# Patient Record
Sex: Female | Born: 1965 | Race: Black or African American | Hispanic: No | Marital: Married | Smoking: Current some day smoker
Health system: Southern US, Community
[De-identification: ages and names within clinical notes are randomized; demographics above are authoritative.]

## PROBLEM LIST (undated history)

## (undated) DIAGNOSIS — R011 Cardiac murmur, unspecified: Secondary | ICD-10-CM

## (undated) DIAGNOSIS — J189 Pneumonia, unspecified organism: Secondary | ICD-10-CM

## (undated) DIAGNOSIS — Z9289 Personal history of other medical treatment: Secondary | ICD-10-CM

## (undated) DIAGNOSIS — I10 Essential (primary) hypertension: Secondary | ICD-10-CM

## (undated) DIAGNOSIS — N946 Dysmenorrhea, unspecified: Secondary | ICD-10-CM

## (undated) DIAGNOSIS — D219 Benign neoplasm of connective and other soft tissue, unspecified: Secondary | ICD-10-CM

## (undated) DIAGNOSIS — F419 Anxiety disorder, unspecified: Secondary | ICD-10-CM

---

## 1988-04-23 HISTORY — PX: CHOLECYSTECTOMY: SHX55

## 1988-04-23 HISTORY — PX: TUBAL LIGATION: SHX77

## 1998-06-18 ENCOUNTER — Emergency Department (HOSPITAL_COMMUNITY): Admission: EM | Admit: 1998-06-18 | Discharge: 1998-06-18 | Payer: Self-pay | Admitting: Emergency Medicine

## 1999-05-12 ENCOUNTER — Emergency Department (HOSPITAL_COMMUNITY): Admission: EM | Admit: 1999-05-12 | Discharge: 1999-05-12 | Payer: Self-pay | Admitting: Emergency Medicine

## 1999-07-08 ENCOUNTER — Encounter: Payer: Self-pay | Admitting: *Deleted

## 1999-07-08 ENCOUNTER — Emergency Department (HOSPITAL_COMMUNITY): Admission: EM | Admit: 1999-07-08 | Discharge: 1999-07-08 | Payer: Self-pay | Admitting: *Deleted

## 2000-03-31 ENCOUNTER — Encounter: Payer: Self-pay | Admitting: *Deleted

## 2000-03-31 ENCOUNTER — Emergency Department (HOSPITAL_COMMUNITY): Admission: EM | Admit: 2000-03-31 | Discharge: 2000-03-31 | Payer: Self-pay | Admitting: Emergency Medicine

## 2000-05-21 ENCOUNTER — Encounter: Payer: Self-pay | Admitting: Emergency Medicine

## 2000-05-21 ENCOUNTER — Emergency Department (HOSPITAL_COMMUNITY): Admission: EM | Admit: 2000-05-21 | Discharge: 2000-05-21 | Payer: Self-pay | Admitting: Emergency Medicine

## 2000-05-27 ENCOUNTER — Encounter: Admission: RE | Admit: 2000-05-27 | Discharge: 2000-07-17 | Payer: Self-pay | Admitting: Orthopaedic Surgery

## 2000-12-28 ENCOUNTER — Emergency Department (HOSPITAL_COMMUNITY): Admission: EM | Admit: 2000-12-28 | Discharge: 2000-12-28 | Payer: Self-pay | Admitting: Emergency Medicine

## 2000-12-29 ENCOUNTER — Emergency Department (HOSPITAL_COMMUNITY): Admission: EM | Admit: 2000-12-29 | Discharge: 2000-12-29 | Payer: Self-pay | Admitting: *Deleted

## 2000-12-29 ENCOUNTER — Encounter: Payer: Self-pay | Admitting: Emergency Medicine

## 2002-09-07 ENCOUNTER — Emergency Department (HOSPITAL_COMMUNITY): Admission: EM | Admit: 2002-09-07 | Discharge: 2002-09-07 | Payer: Self-pay

## 2003-02-23 ENCOUNTER — Emergency Department (HOSPITAL_COMMUNITY): Admission: AD | Admit: 2003-02-23 | Discharge: 2003-02-23 | Payer: Self-pay | Admitting: Family Medicine

## 2003-02-28 ENCOUNTER — Emergency Department (HOSPITAL_COMMUNITY): Admission: AD | Admit: 2003-02-28 | Discharge: 2003-02-28 | Payer: Self-pay | Admitting: Family Medicine

## 2004-09-21 ENCOUNTER — Emergency Department (HOSPITAL_COMMUNITY): Admission: EM | Admit: 2004-09-21 | Discharge: 2004-09-21 | Payer: Self-pay | Admitting: Emergency Medicine

## 2005-02-06 ENCOUNTER — Emergency Department (HOSPITAL_COMMUNITY): Admission: EM | Admit: 2005-02-06 | Discharge: 2005-02-06 | Payer: Self-pay | Admitting: Emergency Medicine

## 2006-06-05 ENCOUNTER — Emergency Department (HOSPITAL_COMMUNITY): Admission: EM | Admit: 2006-06-05 | Discharge: 2006-06-05 | Payer: Self-pay | Admitting: Emergency Medicine

## 2006-10-22 ENCOUNTER — Other Ambulatory Visit: Admission: RE | Admit: 2006-10-22 | Discharge: 2006-10-22 | Payer: Self-pay | Admitting: Family Medicine

## 2006-11-20 ENCOUNTER — Inpatient Hospital Stay (HOSPITAL_COMMUNITY): Admission: EM | Admit: 2006-11-20 | Discharge: 2006-11-21 | Payer: Self-pay | Admitting: Emergency Medicine

## 2007-03-21 ENCOUNTER — Emergency Department (HOSPITAL_COMMUNITY): Admission: EM | Admit: 2007-03-21 | Discharge: 2007-03-22 | Payer: Self-pay | Admitting: Emergency Medicine

## 2007-04-13 ENCOUNTER — Emergency Department (HOSPITAL_COMMUNITY): Admission: EM | Admit: 2007-04-13 | Discharge: 2007-04-13 | Payer: Self-pay | Admitting: Emergency Medicine

## 2007-05-20 ENCOUNTER — Emergency Department (HOSPITAL_COMMUNITY): Admission: EM | Admit: 2007-05-20 | Discharge: 2007-05-20 | Payer: Self-pay | Admitting: Emergency Medicine

## 2007-06-07 ENCOUNTER — Emergency Department (HOSPITAL_COMMUNITY): Admission: EM | Admit: 2007-06-07 | Discharge: 2007-06-07 | Payer: Self-pay | Admitting: Emergency Medicine

## 2007-08-24 ENCOUNTER — Emergency Department (HOSPITAL_COMMUNITY): Admission: EM | Admit: 2007-08-24 | Discharge: 2007-08-24 | Payer: Self-pay | Admitting: Family Medicine

## 2008-01-17 ENCOUNTER — Emergency Department (HOSPITAL_COMMUNITY): Admission: EM | Admit: 2008-01-17 | Discharge: 2008-01-18 | Payer: Self-pay | Admitting: Internal Medicine

## 2008-03-16 ENCOUNTER — Emergency Department (HOSPITAL_COMMUNITY): Admission: EM | Admit: 2008-03-16 | Discharge: 2008-03-17 | Payer: Self-pay | Admitting: Emergency Medicine

## 2008-06-02 ENCOUNTER — Emergency Department (HOSPITAL_COMMUNITY): Admission: EM | Admit: 2008-06-02 | Discharge: 2008-06-02 | Payer: Self-pay | Admitting: Family Medicine

## 2008-10-12 ENCOUNTER — Other Ambulatory Visit: Admission: RE | Admit: 2008-10-12 | Discharge: 2008-10-12 | Payer: Self-pay | Admitting: Family Medicine

## 2009-10-29 ENCOUNTER — Emergency Department (HOSPITAL_COMMUNITY): Admission: EM | Admit: 2009-10-29 | Discharge: 2009-10-29 | Payer: Self-pay | Admitting: Emergency Medicine

## 2009-11-15 ENCOUNTER — Emergency Department (HOSPITAL_COMMUNITY): Admission: EM | Admit: 2009-11-15 | Discharge: 2009-11-15 | Payer: Self-pay | Admitting: Emergency Medicine

## 2010-04-13 ENCOUNTER — Emergency Department (HOSPITAL_COMMUNITY)
Admission: EM | Admit: 2010-04-13 | Discharge: 2010-04-13 | Payer: Self-pay | Source: Home / Self Care | Admitting: Family Medicine

## 2010-04-19 ENCOUNTER — Emergency Department (HOSPITAL_COMMUNITY)
Admission: EM | Admit: 2010-04-19 | Discharge: 2010-04-19 | Payer: Self-pay | Source: Home / Self Care | Admitting: Emergency Medicine

## 2010-06-18 ENCOUNTER — Inpatient Hospital Stay (INDEPENDENT_AMBULATORY_CARE_PROVIDER_SITE_OTHER)
Admission: RE | Admit: 2010-06-18 | Discharge: 2010-06-18 | Disposition: A | Discharge status: Home / Self Care | Payer: Self-pay | Source: Ambulatory Visit | Attending: Emergency Medicine | Admitting: Emergency Medicine

## 2010-06-18 DIAGNOSIS — N760 Acute vaginitis: Secondary | ICD-10-CM

## 2010-06-18 LAB — POCT URINALYSIS DIPSTICK
Bilirubin Urine: NEGATIVE
Hgb urine dipstick: NEGATIVE
Ketones, ur: NEGATIVE mg/dL
Nitrite: NEGATIVE
Protein, ur: NEGATIVE mg/dL
Specific Gravity, Urine: <=1.005 (ref 1.005–1.030)
Urine Glucose, Fasting: NEGATIVE mg/dL
Urobilinogen, UA: 0.2 mg/dL (ref 0.0–1.0)
pH: 6.5 (ref 5.0–8.0)

## 2010-06-18 LAB — WET PREP, GENITAL
Clue Cells Wet Prep HPF POC: NONE SEEN
Trich, Wet Prep: NONE SEEN
Yeast Wet Prep HPF POC: NONE SEEN

## 2010-06-18 LAB — POCT PREGNANCY, URINE: Preg Test, Ur: NEGATIVE

## 2010-06-19 LAB — GC/CHLAMYDIA PROBE AMP, GENITAL
Chlamydia, DNA Probe: NEGATIVE
GC Probe Amp, Genital: NEGATIVE

## 2010-07-08 LAB — WET PREP, GENITAL
Trich, Wet Prep: NONE SEEN
Yeast Wet Prep HPF POC: NONE SEEN

## 2010-07-08 LAB — POCT URINALYSIS DIP (DEVICE)
Bilirubin Urine: NEGATIVE
Glucose, UA: NEGATIVE mg/dL
Ketones, ur: NEGATIVE mg/dL
Nitrite: NEGATIVE
Protein, ur: 30 mg/dL — AB
Specific Gravity, Urine: 1.02 (ref 1.005–1.030)
Urobilinogen, UA: 0.2 mg/dL (ref 0.0–1.0)
pH: 6 (ref 5.0–8.0)

## 2010-07-08 LAB — POCT PREGNANCY, URINE: Preg Test, Ur: NEGATIVE

## 2010-07-08 LAB — GC/CHLAMYDIA PROBE AMP, GENITAL
Chlamydia, DNA Probe: NEGATIVE
GC Probe Amp, Genital: NEGATIVE

## 2010-07-09 LAB — WET PREP, GENITAL
Trich, Wet Prep: NONE SEEN
Yeast Wet Prep HPF POC: NONE SEEN

## 2010-07-09 LAB — GC/CHLAMYDIA PROBE AMP, GENITAL
Chlamydia, DNA Probe: NEGATIVE
GC Probe Amp, Genital: NEGATIVE

## 2010-09-05 NOTE — Discharge Summary (Signed)
NAMESAM, WUNSCHEL NO.:  [id number]   MEDICAL RECORD NO.:  [id number]          PATIENT TYPE:  INP   LOCATION:  5703                         FACILITY:  [facility]   PHYSICIAN:  Dr. Janee Morn           DATE OF BIRTH:  [birth year]   DATE OF ADMISSION:  11/20/2006  DATE OF DISCHARGE:  11/21/2006                               DISCHARGE SUMMARY   CHIEF COMPLAINT/REASON FOR ADMISSION:  Ms. Menendez is a 45 year old  female patient with history of IBS with constipation as the primary  symptom, also anemia recently started on iron replacement therapy  several weeks ago.  She presented to the ER on the day of admission with  sudden onset of abdominal pain, sharp and cramping 10/10.  By the time  Dr. Luisa Hart evaluated the patient, she was greatly improved.  She was  passing flatus but had not had a BM since yesterday.  She was having  nausea and vomiting and she said the pain felt gas-like.  In the ER, the  patient had stable vital signs and was afebrile.  Her white count was  elevated at 13,400, hemoglobin 0.4, platelets 351, neutrophils 76%.  Potassium 5, creatinine 0.95, BUN 5, calcium was 11.4.  Total bilirubin  was elevated at 1.9.  Lipase was 14.  Urinalysis showed moderate  leukocyte esterase, few epithelials, urine wbc 7-10 and also a trace of  Trichomonas.  Because of her abdominal symptoms, she underwent a CT scan  of the abdomen and pelvis that was questioning new lateral herniation of  small bowel suggestive of internal hernia without obstruction or  inflammation or vascular compromise.  The colon was distended to the  rectum without any evidence of volvulus and there was a massive amount  of air and stool in the colon.  Dr. Luisa Hart was called in to evaluate  the patient because of the possible internal hernia.  He did review the  films with the radiologist and at this time did not feel that her  clinical presentation was consistent with internal hernia and her  symptoms were more related to the massive colonic dilatation, possibly  from severe chronic stool.  There were no signs of volvulus.  There was  some concern the patient may have a mass versus, have atonic colon and a  functional problem with her colon.  She was admitted for further  evaluation.   ADMITTING DIAGNOSIS:  Nausea, vomiting and abdominal pain secondary to  massive colonic distension, etiology uncertain.   HOSPITAL COURSE:  The patient was admitted.  She was placed empirically  on Cipro and Flagyl, started aggressive IV fluid hydration and because  of the elevated calcium, a parathyroid level was checked.  By the  afternoon of the date of admission, the patient's abdomen was much less  distended and soft, nontender and she was passing large volumes of  flatus and some mushy stool.   On the same date, the patient was evaluated by GI physician, Dr. Evette Cristal,  who agreed with treating the patient with Cipro for UTI and the Flagyl  for  the Trichomonas.  At this time, he did not feel any specific reason  to do a colonoscopy.  MiraLax was initiated and the patient was started  on a clear liquid diet.  By date of discharge, the patient's white count  had normalized to 8300.  Her hemoglobin was back to near baseline at  10.2 after hydration, her total bilirubin decreased to 1 as well as her  calcium decreased to 8.9 after hydration.  Her parathyroid level was  still pending.  She had not passed a bowel movement but she was only  taking a clear liquid diet without nausea or any abdominal pain.  She  was reporting that she had the sensation to have a BM but had not had a  bowel movement yet.  The patient was started on a regular diet and was  also given a 2 gram dose of p.o. Flagyl to treat the Trichomonas and  plans were, if the patient tolerated her diet, to go ahead and discharge  her later in the day on November 21, 2006.   FINAL DISCHARGE DIAGNOSES:  1. Abdominal pain, nausea and  vomiting secondary to massive colonic      distension.  2. History of irritable bowel syndrome with constipation as primary      feature.  3. Recent initiation of iron therapy, suspect etiology for worsening      constipation.  4. Urinary tract infection on Cipro.  Cultures not obtained.  5. Hypercalcemia, suspected related to volume depletion, parathyroid      level pending at time of discharge.  6. Trace Trichomonas in urine treated with single dose Flagyl this      admission.  7. Anemia on iron prior to admission.   DISCHARGE MEDICATIONS:  1. Iron 325 mg daily.  2. MiraLax 17 grams in 8 ounces of fluid twice daily for three days      then daily, this is available over-the-counter.  3. Colace/docusate sodium 100 mg three times daily, this is over-the-      counter.  4. Cipro 500 mg b.i.d. for three days for UTI.  5. The patient was given 2 grams Flagyl at hospital to treat trace      Trichomonas in urine.   DIET:  No restrictions.   WOUND CARE:  Not applicable.   ACTIVITY:  Increase activity slowly.  Return to work this coming Monday,  November 25, 2006.   FOLLOW UP:  1. You need to follow up with Dr. Evette Cristal in gastroenterology as needed      or as instructed.  2. You need to follow up with your primary care physician, Dr. Mirna Mires, to be seen in one week.      Allison L. Rennis Harding, N.P.    ______________________________  Dr. Janee Morn    ALE/MEDQ  D:  11/21/2006  T:  11/21/2006  Job:  [id number]   cc:   Graylin Shiver, M.D.  Annia Friendly. Loleta Chance, MD

## 2010-09-05 NOTE — Consult Note (Signed)
[name], Kimberly Jefferson NO.:  [id number]   MEDICAL RECORD NO.:  [id number]          PATIENT TYPE:  INP   LOCATION:  5703                         FACILITY:  [facility]   PHYSICIAN:  Maisie Fus A. Cornett, M.D.DATE OF BIRTH:  28-May-1965   DATE OF CONSULTATION:  DATE OF DISCHARGE:                                 CONSULTATION   REQUESTING PHYSICIAN:  Jeffrey P. Caporossi, MD   REASON FOR CONSULTATION:  Abdominal pain, nausea and vomiting.   HISTORY OF PRESENT ILLNESS:  The patient is a 45 year old female with a  sudden onset of crampy, sharp abdominal pain showing up at 5 p.m.  yesterday.  It was associated with distention, nausea, and vomiting.  Her last bowel movement was some time over a day ago.  She was not  passing any flatus until just recently.  Her abdominal pain became  severe, and she sought attention here at the medical center.  The pain  is diffuse in her abdomen.  It is sharp in nature, and it is 7 to 10/10.  It was made better by passing gas.  She has had no nausea or vomiting.  She also had some pain medicine, which has helped her.  Currently, she  feels much better than she did earlier.  She is having no nausea, and  her abdominal pain is much improved, she states.  She has history of  irritable bowel syndrome.  Also has a history of iron deficiency anemia,  was recently placed on iron, diagnosed just 2 weeks ago.  Denies any  blood in her stool or urine.  Denies any blood in her emesis.  Does not  complain of any dysuria.   PAST MEDICAL HISTORY:  1. Irritable bowel syndrome.  2. Iron deficiency anemia.   PAST SURGICAL HISTORY:  None.   MEDICATIONS:  Iron.   ALLERGIES TO MEDICINES:  None.   FAMILY HISTORY:  Positive for colon cancer in her brother, who is in his  51s.   SOCIAL HISTORY:  Denies alcohol use, does drink alcohol occasionally.   REVIEW OF SYSTEMS:  Fifteen-point review of systems is as stated above,  otherwise negative x15.   PHYSICAL  EXAMINATION:  VITAL SIGNS:  Temperature 97, blood pressure  130/79, heart rate 60.  GENERAL APPEARANCE:  Pleasant female in no apparent distress.  HEENT:  Extraocular movements are intact.  No evidence of scleral  icterus.  NECK:  Supple, nontender.  Trachea midline.  No mass.  Oropharynx is  moist.  PULMONARY:  Lung sounds are clear bilaterally.  Chest wall motion is  normal.  CARDIOVASCULAR:  Regular rate and rhythm without rub, murmur, or gallop.  EXTREMITIES:  Well perfused and warm.  ABDOMEN:  Soft, minimally distended.  Bowel sounds are present and  normoactive.  No rebound, no guarding or mass noted.  No signs of  peritonitis.  GU/RECTAL EXAMINATION:  Done.  Tone was normal.  No gross blood.  No  mass could be palpated as far as I could get my finger up, which is  about 4-5 cm.  EXTREMITIES:  Muscle tone normal.  Range  of motion normal.  NEUROLOGICAL EXAMINATION:  Motor and sensory function are grossly  intact.  Glasgow coma scale is 15.   DIAGNOSTIC STUDIES:  I reviewed her abdominal and pelvic CT.  She had a  dilated stomach and a massively dilated colon, primarily involving her  transverse and left colon all the way into the rectum with rectal  dilatation.  Small bowel was displaced to the right of the cecum in the  right gutter.  It is not edematous and shows no signs of dilatation.  Stomach is distended.  The patient has a white count of 13,400.  There  is no left shift.  She is anemic with a hemoglobin of 11.4.  Platelet  count is normal.  Her calcium, though, is 11.4, which is elevated.  The  remainder of her electrolytes are within normal limits.  Liver function  is within normal limits.  Lipase is 14.  Urinalysis shows some white  cells and is leukocyte esterase positive.   IMPRESSION:  1. Distal colonic obstruction versus functional obstruction of distal      colon with history of iron deficiency anemia.  2. Probable urinary tract infection.  3.  Hypercalcemia.   DISCUSSION:  I reviewed her scan and examined the patient.  Doubt she  has an internal hernia at this point, since she is much better, and  dilatation of her colon all the way into the rectum, which does not  support an internal hernia.  Her scan does look somewhat atypical with  her small bowel and her right gutter, but I am not sure if it is  secondary to displacement of all her small bowel towards the right lower  quadrant due to the distention of her colon.  She does not have any  peritoneal signs.  Currently has been passing some gas.  I have to  question if this is a functional problem, colonic inertia versus a  structural problem in the mid rectum, which would be a concern for  possible colon cancer.  I believe admission, further workup is  warranted, and a GI consultation will be helpful.  As for her  hypercalcemia, PTH level will need to be drawn as well.  We will place  her on Cipro 400 mg IV b.i.d. to treat urinary tract infection.  I have  explained this to the patient.  She understands the above and agrees to  proceed.  Clinically, though, she looks much better than earlier this  evening by reviewing her report.      Thomas A. Cornett, M.D.  Electronically Signed     TAC/MEDQ  D:  11/20/2006  T:  11/20/2006  Job:  [id number]

## 2010-09-05 NOTE — Consult Note (Signed)
NAMELAYLA, KESLING NO.:  [id number]   MEDICAL RECORD NO.:  [id number]          PATIENT TYPE:  INP   LOCATION:  5703                         FACILITY:  [facility]   PHYSICIAN:  Graylin Shiver, M.D.   DATE OF BIRTH:  [birth year]   DATE OF CONSULTATION:  DATE OF DISCHARGE:                                 CONSULTATION   REASON FOR CONSULTATION:  We were asked to see Ms. Schryver in  consultation for abdominal pain by Dr. Harriette Bouillon of [facility] surgery.   HISTORY OF PRESENT ILLNESS:  This is a 45 year old female with sudden  onset of sharp abdominal pain and distension that started yesterday at  5:30 p.m.  The patient reports it started in her lower abdomen and  initially felt like gas.  The pain progressed to cover her entire  abdomen, and per the patient, her abdomen is normally flat but became  hard and distended yesterday evening.  She reports no recent illness.  She reports that 3 weeks ago she started taking ferrous sulfate 325 mg  b.i.d.  She reports that she has gained approximately 5 pounds in the  last month.  She has had a change in bowel habits in that she is  normally constipated, but since starting on iron, her bowel movements  have become more frequent and creamy.  She reports positive flatus.  She  denies any vomiting, melena, hematochezia, previous history of this  abdominal pain.  She denies any anorexia.   PAST MEDICAL HISTORY:  1. C-section.  2. Cholecystectomy.  3. Currently she has been diagnosed with anemia.  4. History of irritable bowel syndrome.   CURRENT MEDICATIONS:  Ferrous sulfate 325 mg b.i.d.   REVIEW OF SYSTEMS:  positive for lower extremity swelling over the last  month and 5 pound weight gain.   SOCIAL HISTORY:  She denies any alcohol use ever and reports positive  one pack per day times 14 years tobacco use.   FAMILY HISTORY:  Negative for colon cancer.  However, her brother has a  colon problem.  She was unsure  of what it was.  There is no irritable  bowel disease that she knows of in the family.   PHYSICAL EXAMINATION:  GENERAL:  She is alert and oriented in no  apparent distress.  She is very pleasant to speak with.  Her  cardiovascular system is slightly bradycardic with a regular rhythm and  no murmurs, rubs or gallops appreciated.  Lungs are clear.  Her abdomen  is soft, mildly to moderately distended.  She has good bowel sounds.  She is nontender.  LFTs show an increased bilirubin of 1.9, hemoglobin  is currently 11.4 with an MCV value of 76.  She has a white count 13.4.  Her Chem-7 is within normal limits.  Serum calcium is high at 11.4.  She  has a urinalysis that is positive for UTI as well as trichomoniasis.  CT  scan done earlier this morning showed:  1. Stomach distended and fluid filled.  2. Cecum filled with stool.  3. Internal hernia.  4.  No bowel obstruction or volvulus.   ASSESSMENT:  Dr. Wandalee Ferdinand has seen and examined the patient, collected  a history.  His impression is that this is a 44 year old African  American female with sudden onset of abdominal pain and distension.  She  has constipation, possible ileus, elevated bilirubin urinary tract  infection with Trich infection as well as an internal hernia.  Agree  with Cipro.  May need metronidazole for Trich.  Will start MiraLax to  soften stools and clean out bowel.  Will follow with you.  Thank you  very much for this consultation.      Stephani Police, PA    ______________________________  Graylin Shiver, M.D.    MLY/MEDQ  D:  11/20/2006  T:  11/21/2006  Job:  [id number]   cc:   Annia Friendly. Loleta Chance, MD  Clovis Pu. Cornett, M.D.

## 2010-10-18 ENCOUNTER — Inpatient Hospital Stay (INDEPENDENT_AMBULATORY_CARE_PROVIDER_SITE_OTHER)
Admission: RE | Admit: 2010-10-18 | Discharge: 2010-10-18 | Disposition: A | Discharge status: Home / Self Care | Payer: Self-pay | Source: Ambulatory Visit

## 2010-10-18 DIAGNOSIS — K089 Disorder of teeth and supporting structures, unspecified: Secondary | ICD-10-CM

## 2010-11-20 ENCOUNTER — Encounter (INDEPENDENT_AMBULATORY_CARE_PROVIDER_SITE_OTHER): Payer: Self-pay | Admitting: *Deleted

## 2010-11-20 NOTE — Telephone Encounter (Signed)
A user error has taken place: disregard.

## 2011-01-15 LAB — POCT RAPID STREP A: Streptococcus, Group A Screen (Direct): NEGATIVE

## 2011-01-15 LAB — INFLUENZA A AND B ANTIGEN (CONVERTED LAB)
Inflenza A Ag: NEGATIVE
Influenza B Ag: NEGATIVE

## 2011-01-24 LAB — POCT PREGNANCY, URINE: Preg Test, Ur: NEGATIVE

## 2011-01-26 ENCOUNTER — Encounter (HOSPITAL_COMMUNITY): Payer: Self-pay | Admitting: Radiology

## 2011-01-26 ENCOUNTER — Emergency Department (HOSPITAL_COMMUNITY): Payer: Self-pay

## 2011-01-26 ENCOUNTER — Observation Stay (HOSPITAL_COMMUNITY)
Admission: EM | Admit: 2011-01-26 | Discharge: 2011-01-26 | Disposition: A | Discharge status: Home / Self Care | Payer: Self-pay | Attending: Emergency Medicine | Admitting: Emergency Medicine

## 2011-01-26 ENCOUNTER — Observation Stay (HOSPITAL_COMMUNITY): Payer: Self-pay

## 2011-01-26 DIAGNOSIS — A5901 Trichomonal vulvovaginitis: Secondary | ICD-10-CM | POA: Insufficient documentation

## 2011-01-26 DIAGNOSIS — N739 Female pelvic inflammatory disease, unspecified: Secondary | ICD-10-CM | POA: Insufficient documentation

## 2011-01-26 DIAGNOSIS — R079 Chest pain, unspecified: Principal | ICD-10-CM | POA: Insufficient documentation

## 2011-01-26 DIAGNOSIS — N946 Dysmenorrhea, unspecified: Secondary | ICD-10-CM | POA: Insufficient documentation

## 2011-01-26 HISTORY — DX: Essential (primary) hypertension: I10

## 2011-01-26 LAB — POCT I-STAT, CHEM 8
BUN: 11 mg/dL (ref 6–23)
Calcium, Ion: 1.12 mmol/L (ref 1.12–1.32)
Creatinine, Ser: 0.9 mg/dL (ref 0.50–1.10)
Hemoglobin: 10.2 g/dL — ABNORMAL LOW (ref 12.0–15.0)
Sodium: 140 mEq/L (ref 135–145)
TCO2: 21 mmol/L (ref 0–100)

## 2011-01-26 LAB — CBC
HCT: 29 % — ABNORMAL LOW (ref 36.0–46.0)
Hemoglobin: 8.3 g/dL — ABNORMAL LOW (ref 12.0–15.0)
MCH: 19.4 pg — ABNORMAL LOW (ref 26.0–34.0)
MCHC: 28.6 g/dL — ABNORMAL LOW (ref 30.0–36.0)
MCV: 67.9 fL — ABNORMAL LOW (ref 78.0–100.0)
RDW: 19.2 % — ABNORMAL HIGH (ref 11.5–15.5)

## 2011-01-26 LAB — WET PREP, GENITAL
Clue Cells Wet Prep HPF POC: NONE SEEN
Yeast Wet Prep HPF POC: NONE SEEN

## 2011-01-26 LAB — DIFFERENTIAL
Eosinophils Relative: 2 % (ref 0–5)
Lymphs Abs: 3.1 10*3/uL (ref 0.7–4.0)
Monocytes Absolute: 0.9 10*3/uL (ref 0.1–1.0)
Monocytes Relative: 8 % (ref 3–12)
Neutro Abs: 7.6 10*3/uL (ref 1.7–7.7)

## 2011-01-26 LAB — CK TOTAL AND CKMB (NOT AT ARMC): Total CK: 94 U/L (ref 7–177)

## 2011-01-26 MED ORDER — IOHEXOL 350 MG/ML SOLN
80.0000 mL | Freq: Once | INTRAVENOUS | Status: AC | PRN
  Administered 2011-01-26: 80 mL via INTRAVENOUS

## 2011-01-29 LAB — GC/CHLAMYDIA PROBE AMP, GENITAL: GC Probe Amp, Genital: NEGATIVE

## 2011-01-30 LAB — CBC
HCT: 35.3 — ABNORMAL LOW
Hemoglobin: 11.8 — ABNORMAL LOW
MCHC: 33.3
MCV: 79.8
RBC: 4.42

## 2011-01-30 LAB — I-STAT 8, (EC8 V) (CONVERTED LAB)
BUN: 10
Chloride: 107
Glucose, Bld: 89
Hemoglobin: 13.6
Operator id: 272551
pCO2, Ven: 46.5

## 2011-01-30 LAB — URINALYSIS, ROUTINE W REFLEX MICROSCOPIC
Hgb urine dipstick: NEGATIVE
Nitrite: NEGATIVE
Specific Gravity, Urine: 1.005
Urobilinogen, UA: 0.2

## 2011-01-30 LAB — DIFFERENTIAL
Basophils Relative: 1
Eosinophils Relative: 4
Monocytes Absolute: 1
Monocytes Relative: 7
Neutro Abs: 9.5 — ABNORMAL HIGH

## 2011-02-05 LAB — DIFFERENTIAL
Basophils Absolute: 0
Basophils Absolute: 0.1
Eosinophils Absolute: 0.1
Eosinophils Relative: 1
Eosinophils Relative: 2
Lymphocytes Relative: 43
Lymphs Abs: 2.4
Lymphs Abs: 3.6 — ABNORMAL HIGH
Monocytes Absolute: 0.6
Monocytes Relative: 7
Neutro Abs: 3.9

## 2011-02-05 LAB — URINALYSIS, ROUTINE W REFLEX MICROSCOPIC
Bilirubin Urine: NEGATIVE
Ketones, ur: NEGATIVE
Nitrite: NEGATIVE
pH: 8

## 2011-02-05 LAB — COMPREHENSIVE METABOLIC PANEL
ALT: 10
AST: 15
AST: 34
Albumin: 3.4 — ABNORMAL LOW
BUN: 3 — ABNORMAL LOW
CO2: 29
Calcium: 8.9
Chloride: 100
Chloride: 108
Creatinine, Ser: 0.93
GFR calc Af Amer: >60
GFR calc Af Amer: >60
GFR calc non Af Amer: >60
Potassium: 5
Sodium: 137
Total Bilirubin: 1.9 — ABNORMAL HIGH
Total Protein: 5.9 — ABNORMAL LOW

## 2011-02-05 LAB — CBC
HCT: 31.2 — ABNORMAL LOW
HCT: 31.7 — ABNORMAL LOW
MCHC: 32.6
MCV: 75.7 — ABNORMAL LOW
MCV: 77.4 — ABNORMAL LOW
Platelets: 271
RBC: 4.13
RBC: 4.51
RDW: 18.3 — ABNORMAL HIGH
WBC: 11.8 — ABNORMAL HIGH
WBC: 13.4 — ABNORMAL HIGH
WBC: 8.3

## 2011-02-05 LAB — URINE MICROSCOPIC-ADD ON

## 2011-02-05 LAB — LIPASE, BLOOD: Lipase: 14

## 2011-04-24 DIAGNOSIS — Z9289 Personal history of other medical treatment: Secondary | ICD-10-CM

## 2011-04-24 HISTORY — DX: Personal history of other medical treatment: Z92.89

## 2011-07-15 ENCOUNTER — Emergency Department (HOSPITAL_COMMUNITY): Payer: Self-pay

## 2011-07-15 ENCOUNTER — Encounter (HOSPITAL_COMMUNITY): Payer: Self-pay

## 2011-07-15 ENCOUNTER — Other Ambulatory Visit: Payer: Self-pay

## 2011-07-15 ENCOUNTER — Emergency Department (HOSPITAL_COMMUNITY)
Admission: EM | Admit: 2011-07-15 | Discharge: 2011-07-15 | Disposition: A | Discharge status: Home / Self Care | Payer: Self-pay | Attending: Emergency Medicine | Admitting: Emergency Medicine

## 2011-07-15 DIAGNOSIS — I10 Essential (primary) hypertension: Secondary | ICD-10-CM | POA: Insufficient documentation

## 2011-07-15 DIAGNOSIS — R1032 Left lower quadrant pain: Secondary | ICD-10-CM | POA: Insufficient documentation

## 2011-07-15 DIAGNOSIS — N898 Other specified noninflammatory disorders of vagina: Secondary | ICD-10-CM | POA: Insufficient documentation

## 2011-07-15 DIAGNOSIS — R079 Chest pain, unspecified: Secondary | ICD-10-CM | POA: Insufficient documentation

## 2011-07-15 LAB — DIFFERENTIAL
Basophils Relative: 0 % (ref 0–1)
Eosinophils Absolute: 0.3 10*3/uL (ref 0.0–0.7)
Lymphocytes Relative: 23 % (ref 12–46)
Lymphs Abs: 2.9 10*3/uL (ref 0.7–4.0)
Neutro Abs: 8.4 10*3/uL — ABNORMAL HIGH (ref 1.7–7.7)

## 2011-07-15 LAB — POCT I-STAT TROPONIN I: Troponin i, poc: 0 ng/mL (ref 0.00–0.08)

## 2011-07-15 LAB — URINALYSIS, ROUTINE W REFLEX MICROSCOPIC
Glucose, UA: NEGATIVE mg/dL
Ketones, ur: 15 mg/dL — AB
pH: 6 (ref 5.0–8.0)

## 2011-07-15 LAB — CBC
HCT: 28.6 % — ABNORMAL LOW (ref 36.0–46.0)
Hemoglobin: 8.2 g/dL — ABNORMAL LOW (ref 12.0–15.0)
MCH: 19.9 pg — ABNORMAL LOW (ref 26.0–34.0)
MCHC: 28.7 g/dL — ABNORMAL LOW (ref 30.0–36.0)
MCV: 69.4 fL — ABNORMAL LOW (ref 78.0–100.0)
RDW: 19.4 % — ABNORMAL HIGH (ref 11.5–15.5)

## 2011-07-15 LAB — POCT I-STAT, CHEM 8
BUN: 13 mg/dL (ref 6–23)
Chloride: 105 mEq/L (ref 96–112)
Creatinine, Ser: 0.9 mg/dL (ref 0.50–1.10)
Sodium: 140 mEq/L (ref 135–145)

## 2011-07-15 LAB — URINE MICROSCOPIC-ADD ON

## 2011-07-15 MED ORDER — NAPROXEN 500 MG PO TABS: 500.0000 mg | ORAL_TABLET | Freq: Two times a day (BID) | ORAL | Status: DC

## 2011-07-15 MED ORDER — SODIUM CHLORIDE 0.9 % IV SOLN
Freq: Once | INTRAVENOUS | Status: AC
  Administered 2011-07-15: 05:00:00 via INTRAVENOUS

## 2011-07-15 MED ORDER — OXYCODONE-ACETAMINOPHEN 5-325 MG PO TABS: 1.0000 | ORAL_TABLET | ORAL | Status: AC | PRN

## 2011-07-15 MED ORDER — HYDROMORPHONE HCL PF 1 MG/ML IJ SOLN
1.0000 mg | Freq: Once | INTRAMUSCULAR | Status: AC
  Administered 2011-07-15: 1 mg via INTRAVENOUS
  Filled 2011-07-15: qty 1

## 2011-07-15 NOTE — ED Notes (Signed)
Patient complaining of chest pain x 2 weeks.  States she started her menstrual cycle last night and is having severe pain in the lower abdominal area.  States "it feels like something is heavy in her stomach trying to fall out."

## 2011-07-15 NOTE — ED Notes (Signed)
Pt discharged home. No further questions. Vital signs stable.

## 2011-07-15 NOTE — Discharge Instructions (Signed)
You have been diagnosed with undifferentiated abdominal pain.  Abdominal pain can be caused by many things. Your caregiver evaluates the seriousness of your pain by an examination and possibly blood or urine tests and imaging (CT scan, x-rays, ultrasound). Many cases can be observed and treated at home after initial evaluation in the emergency department. Even though you are being discharged home, abdominal pain can be unpredictable. Therefore, you need a repeat exam if your pain does not resolve, returns, or worsens. Most patient's with abdominal pain do not need to be admitted to the hospital or have surgery, but serious problems like appendicitis and gallbladder attacks can start out as nonspecific pain. Many abdominal conditions cannot be diagnosed in 1 visit, so followup evaluations are very important.  Seek immediate medical attention if:  *The pain does not go away or becomes severe. *Temperature above 101 develops *Repeated vomiting occurs(multiple episodes) *The pain becomes localized to portions of the abdomen. The right side could possibly be appendicitis. In an adult, the left lower portion of the abdomen could be colitis or diverticulitis. *Blood is being passed in stools or vomit *Return also if you develop chest pain, difficulty breathing, dizziness or fainting, or become confused poorly responsive or inconsolable (young children).     If you do not have a physician, you should reference the below phone numbers and call in the morning to establish follow up care.  Your caregiver has diagnosed you as having chest pain that is nonspecific for one problem. This means that after looking at you and examining you and ordering tests (such as blood work, chest x-rays and EKG), your caregiver does not believe that the problem is serious enough to need watching in the hospital. This judgment is often made after testing shows no acute heart attack and you are at low risk for sudden acute heart  condition. Chest pain comes from many different causes.  Seek immediate medical attention if:   You have severe chest pain, especially if the pain is crushing or pressure-like and spreads to the arms, back, neck, or jaw, or if you have sweating, nausea, shortness of breath. This is an emergency. Don't wait to see if the pain will go away. Get medical help at once. Call 911 immediately. Do not drive herself to the hospital.   Your chest pain gets worse and does not go away with rest.   You have an attack of chest pain lasting longer than usual, despite rest and treatment with the medications your caregiver has prescribed   You awaken from sleep with chest pain or shortness of breath.   You feel faint or dizzy   You have chest pain not typical of your usual pain for which you originally saw your caregiver.  You must have a repeat evaluation within 24 hours for a recheck of your heart.  Please call your doctor this morning to schedule this appointment. If you do not have a family doctor, please see the list of doctors below.  RESOURCE GUIDE  Dental Problems  Patients with Medicaid: [facility] [phone] [address]                                           [phone] [address] Phone:  [phone]                                                  Phone:  [phone]  If unable to pay or uninsured, contact:  [facility] or [facility] to become qualified for the adult dental clinic.  Chronic Pain Problems Contact [facility]  [phone] Patients need to be referred by their primary care doctor.  Insufficient Money for Medicine Contact [employer]:  call "211" or [facility] (289)606-7954.  No Primary Care Doctor Call [facility]  330-519-3444 Other agencies that provide inexpensive medical care    [facility]  [phone]    [facility]  [phone]    [facility]  [phone]    [facility]  [phone]    [facility]  [phone]    [facility]  [phone]  Psychological Services [facility] Behavioral Health  [phone] [facility] Services  [phone] [facility] Mental Health   [phone] (emergency services [phone])  Substance Abuse Resources [facility] and Drug Services  [phone] [facility] (717)710-1595 [facility] [phone] [phone] Floydene Flock ([phone]) [phone] Residential & Outpatient Substance Abuse Program  5598566005  Abuse/Neglect [facility] Child Abuse Hotline (562) 117-5173 [facility] Child Abuse Hotline 615-764-8173 (After Hours)  Emergency Shelter [facility] (331)253-0414  Maternity Homes Room at the [place] [facility] 986-256-0868 [facility] [phone]  MRSA Hotline #:   (980)325-7524    [facility] Resources  [facility]     [employer]                          [facility] Dept. [address] [place]                       [address]      [address]  Blondell Reveal Phone:  [phone]                                   Phone:  [phone]                 Phone:  [phone]  [facility] Mental Health Phone:  [phone]  [facility] Child Abuse Hotline 408-539-6713 618-698-6291 (After Hours)

## 2011-07-15 NOTE — ED Provider Notes (Signed)
History     CSN: [id number]  Arrival date & time 07/15/11  0401   First MD Initiated Contact with Patient 07/15/11 0410      Chief Complaint  Patient presents with  . Chest Pain    (Consider location/radiation/quality/duration/timing/severity/associated sxs/prior treatment) HPI Comments: 46 year old female with a history of hypertension who presents with 2 weeks of left upper sharp chest pain. This is intermittent, worse this evening, feels sharp and stabbing and is not made worse with position or deep breathing. It is made worse with palpation of the left chest. There has been no associated coughing, shortness of breath, fever or back pain. She denies immobilization, recent trauma, surgery, travel, swelling or asymmetry of the legs. She has no history of venous thromboembolism. She also complains of left lower quadrant pain in her abdomen, has started her menstrual cycle in the last 2 days and has had a progressive cramping in the left lower quadrant consistent with her menstrual cramps. She is not regular, has associated vaginal bleeding but no dysuria or changes in bowel habits.  Patient is a 46 y.o. female presenting with chest pain. The history is provided by the patient and the spouse.  Chest Pain     Past Medical History  Diagnosis Date  . Hypertension     Past Surgical History  Procedure Date  . Cholecystectomy     No family history on file.  History  Substance Use Topics  . Smoking status: Current Everyday Smoker  . Smokeless tobacco: Not on file  . Alcohol Use: No    OB History    Grav Para Term Preterm Abortions TAB SAB Ect Mult Living                  Review of Systems  Cardiovascular: Positive for chest pain.  All other systems reviewed and are negative.    Allergies  Review of patient's allergies indicates no known allergies.  Home Medications   Current Outpatient Rx  Name Route Sig Dispense Refill  . ACETAMINOPHEN 500 MG PO TABS Oral Take  1,000 mg by mouth every 6 (six) hours as needed. Fever or pain    . NAPROXEN 500 MG PO TABS Oral Take 1 tablet (500 mg total) by mouth 2 (two) times daily with a meal. 30 tablet 0  . OXYCODONE-ACETAMINOPHEN 5-325 MG PO TABS Oral Take 1 tablet by mouth every 4 (four) hours as needed for pain. May take 2 tablets PO q 6 hours for severe pain - Do not take with Tylenol as this tablet already contains tylenol 15 tablet 0    BP 105/68  Pulse 83  Temp(Src) 98.3 F (36.8 C) (Oral)  Resp 16  Ht 5\' 7"  (1.702 m)  Wt 140 lb (63.504 kg)  BMI 21.93 kg/m2  SpO2 100%  LMP 07/15/2011  Physical Exam  Nursing note and vitals reviewed. Constitutional: She appears well-developed and well-nourished. No distress.  HENT:  Head: Normocephalic and atraumatic.  Mouth/Throat: Oropharynx is clear and moist. No oropharyngeal exudate.  Eyes: Conjunctivae and EOM are normal. Pupils are equal, round, and reactive to light. Right eye exhibits no discharge. Left eye exhibits no discharge. No scleral icterus.  Neck: Normal range of motion. Neck supple. No JVD present. No thyromegaly present.  Cardiovascular: Normal rate, regular rhythm, normal heart sounds and intact distal pulses.  Exam reveals no gallop and no friction rub.   No murmur heard. Pulmonary/Chest: Effort normal and breath sounds normal. No respiratory distress. She has no  wheezes. She has no rales. She exhibits tenderness ( Reproducible tenderness to the left chest wall, patient states is the same as her sharp pain).  Abdominal: Soft. Bowel sounds are normal. She exhibits no distension and no mass. There is tenderness ( Left lower quadrant tenderness with guarding, non-peritoneal, no pain at the right lower quadrant or right upper quadrant. ).  Musculoskeletal: Normal range of motion. She exhibits no edema and no tenderness.  Lymphadenopathy:    She has no cervical adenopathy.  Neurological: She is alert. Coordination normal.  Skin: Skin is warm and dry.  No rash noted. No erythema.  Psychiatric: She has a normal mood and affect. Her behavior is normal.    ED Course  Procedures (including critical care time)  ED ECG REPORT   Date: 07/15/2011   Rate: 62  Rhythm: normal sinus rhythm  QRS Axis: normal  Intervals: normal  ST/T Wave abnormalities: normal  Conduction Disutrbances:none  Narrative Interpretation:   Old EKG Reviewed: unchanged from 01/26/2011   Labs Reviewed  CBC - Abnormal; Notable for the following:    WBC 12.5 (*)    Hemoglobin 8.2 (*)    HCT 28.6 (*)    MCV 69.4 (*)    MCH 19.9 (*)    MCHC 28.7 (*)    RDW 19.4 (*)    All other components within normal limits  DIFFERENTIAL - Abnormal; Notable for the following:    Neutro Abs 8.4 (*)    All other components within normal limits  URINALYSIS, ROUTINE W REFLEX MICROSCOPIC - Abnormal; Notable for the following:    Color, Urine RED (*) BIOCHEMICALS MAY BE AFFECTED BY COLOR   APPearance CLOUDY (*)    Hgb urine dipstick LARGE (*)    Ketones, ur 15 (*)    Protein, ur 30 (*)    Leukocytes, UA MODERATE (*)    All other components within normal limits  POCT I-STAT, CHEM 8 - Abnormal; Notable for the following:    Hemoglobin 10.2 (*)    HCT 30.0 (*)    All other components within normal limits  URINE MICROSCOPIC-ADD ON - Abnormal; Notable for the following:    Bacteria, UA FEW (*)    All other components within normal limits  POCT I-STAT TROPONIN I  POCT PREGNANCY, URINE   Dg Chest Port 1 View  07/15/2011  *RADIOLOGY REPORT*  Clinical Data: Upper chest pain, shortness of breath, history hypertension  PORTABLE CHEST - 1 VIEW  Comparison: Portable exam 0505 hours compared to 01/26/2011  Findings: Normal heart size, mediastinal contours, and pulmonary vascularity. Minimal chronic peribronchial thickening. Lungs clear. No pleural effusion or pneumothorax. Bones unremarkable.  IMPRESSION: No acute abnormalities.  Original Report Authenticated By: Lollie Marrow, M.D.      1. Chest pain   2. Abdominal pain       MDM  Check pregnancy, urinalysis, labs to rule out other sources of abdominal pain, chest x-ray and troponin, EKG nonischemic.  Clinically the patient does not have a pulmonary embolism with oxygen saturations of 100% on room air, no tachycardia and no risk factors for PE  Laboratory data reviewed showing urinalysis with hematuria likely related to vaginal bleeding, no signs of infection, pregnancy negative, CBC with  Slight leukocytosis, anemia which is significant to hemoglobin from 5 months ago at 8.3.  Basic metabolic panel shows no abnormalities of renal function.  Chest x-ray was normal, no signs of abdominal or chest abnormalities.  After taking pain medication, patient had almost  complete resolution of symptoms and on repeat examination prior to discharge there is no abdominal tenderness, normal vital signs.  I have discussed the findings and plan with patient at length, have given referral to gynecology for possible hysterectomy or consideration for alternative therapy for severe menstrual cramps.  Discharge Prescriptions include:  #1 Naprosyn #2 Percocet  Vida Roller, MD 07/15/11 [id number]

## 2012-01-30 ENCOUNTER — Encounter (HOSPITAL_COMMUNITY): Payer: Self-pay

## 2012-01-30 ENCOUNTER — Emergency Department (INDEPENDENT_AMBULATORY_CARE_PROVIDER_SITE_OTHER)
Admission: EM | Admit: 2012-01-30 | Discharge: 2012-01-30 | Disposition: A | Discharge status: Home / Self Care | Payer: Self-pay | Source: Home / Self Care | Attending: Emergency Medicine | Admitting: Emergency Medicine

## 2012-01-30 DIAGNOSIS — B349 Viral infection, unspecified: Secondary | ICD-10-CM

## 2012-01-30 DIAGNOSIS — B9789 Other viral agents as the cause of diseases classified elsewhere: Secondary | ICD-10-CM

## 2012-01-30 MED ORDER — IBUPROFEN 800 MG PO TABS
800.0000 mg | ORAL_TABLET | Freq: Once | ORAL | Status: AC
  Administered 2012-01-30: 800 mg via ORAL

## 2012-01-30 MED ORDER — IBUPROFEN 800 MG PO TABS: 800.0000 mg | ORAL_TABLET | Freq: Three times a day (TID) | ORAL | Status: DC

## 2012-01-30 MED ORDER — IBUPROFEN 800 MG PO TABS
ORAL_TABLET | ORAL | Status: AC
  Filled 2012-01-30: qty 1

## 2012-01-30 NOTE — ED Provider Notes (Signed)
History     CSN: [id number]  Arrival date & time 01/30/12  1854   None     Chief Complaint  Patient presents with  . Generalized Body Aches    (Consider location/radiation/quality/duration/timing/severity/associated sxs/prior treatment) The history is provided by the patient.  patient reports flu like symptoms for one week, has been taking tylenol for symptoms.  States found out today he fiance of 2 years is actively being treated for HIV.  Past Medical History  Diagnosis Date  . Hypertension     Past Surgical History  Procedure Date  . Cholecystectomy     History reviewed. No pertinent family history.  History  Substance Use Topics  . Smoking status: Current Every Day Smoker  . Smokeless tobacco: Not on file  . Alcohol Use: No    OB History    Grav Para Term Preterm Abortions TAB SAB Ect Mult Living                  Review of Systems  Constitutional: Positive for fever, chills and fatigue. Negative for diaphoresis, activity change and appetite change.  HENT: Positive for congestion and rhinorrhea. Negative for sore throat.   Respiratory: Negative.   Cardiovascular: Negative.   Gastrointestinal: Positive for nausea, vomiting and diarrhea.  Genitourinary: Negative.   Musculoskeletal: Positive for myalgias.  Skin: Negative.   Neurological: Negative.   Hematological: Negative for adenopathy.  Psychiatric/Behavioral: Negative.     Allergies  Review of patient's allergies indicates no known allergies.  Home Medications   Current Outpatient Rx  Name Route Sig Dispense Refill  . ACETAMINOPHEN 500 MG PO TABS Oral Take 1,000 mg by mouth every 6 (six) hours as needed. Fever or pain    . IBUPROFEN 800 MG PO TABS Oral Take 1 tablet (800 mg total) by mouth 3 (three) times daily. 30 tablet 0  . NAPROXEN 500 MG PO TABS Oral Take 1 tablet (500 mg total) by mouth 2 (two) times daily with a meal. 30 tablet 0    BP 124/69  Pulse 81  Temp 98.9 F (37.2 C) (Oral)   Resp 18  SpO2 99%  LMP 01/29/2012  Physical Exam  Nursing note and vitals reviewed. Constitutional: She is oriented to person, place, and time. Vital signs are normal. She appears well-developed and well-nourished. She is active and cooperative.  HENT:  Head: Normocephalic.  Right Ear: Hearing, tympanic membrane, external ear and ear canal normal.  Left Ear: Hearing, tympanic membrane, external ear and ear canal normal.  Nose: Rhinorrhea present. Right sinus exhibits no maxillary sinus tenderness and no frontal sinus tenderness. Left sinus exhibits no maxillary sinus tenderness and no frontal sinus tenderness.  Mouth/Throat: Uvula is midline, oropharynx is clear and moist and mucous membranes are normal. No oropharyngeal exudate.  Eyes: Conjunctivae normal and EOM are normal. Pupils are equal, round, and reactive to light. No scleral icterus.  Neck: Trachea normal and normal range of motion. Neck supple. No spinous process tenderness and no muscular tenderness present.  Cardiovascular: Normal rate, regular rhythm, normal heart sounds, intact distal pulses and normal pulses.   Pulmonary/Chest: Effort normal and breath sounds normal. No respiratory distress. She has no wheezes.  Abdominal: Soft. Normal appearance and bowel sounds are normal. There is no hepatosplenomegaly. There is no tenderness. There is no rebound, no guarding and no CVA tenderness.  Musculoskeletal: Normal range of motion.  Lymphadenopathy:       Head (right side): No submental, no submandibular, no tonsillar, no preauricular,  no posterior auricular and no occipital adenopathy present.       Head (left side): No submental, no submandibular, no tonsillar, no preauricular, no posterior auricular and no occipital adenopathy present.    She has no cervical adenopathy.  Neurological: She is alert and oriented to person, place, and time. She has normal strength and normal reflexes. No cranial nerve deficit or sensory deficit.  Coordination and gait normal. GCS eye subscore is 4. GCS verbal subscore is 5. GCS motor subscore is 6.  Skin: Skin is warm and dry. No rash noted.  Psychiatric: She has a normal mood and affect. Her speech is normal and behavior is normal. Judgment and thought content normal. Cognition and memory are normal.       Tearful on exam    ED Course  Procedures (including critical care time)   Labs Reviewed  HIV ANTIBODY (ROUTINE TESTING)  LAB REPORT - SCANNED   No results found.   1. Viral illness       MDM  HIV testing, await results.  Will need second test in three months.  Follow up with your primary care provider.         Johnsie Kindred, NP 01/31/12 1206

## 2012-01-30 NOTE — ED Notes (Signed)
States she has had general body aches x 4 days, with nausea, HA menstrual cramps . State she boyfriend had informed her the other day that he is HIV positive, and is undergoing treatment; tearful

## 2012-01-31 ENCOUNTER — Telehealth (HOSPITAL_COMMUNITY): Payer: Self-pay | Admitting: *Deleted

## 2012-01-31 NOTE — ED Notes (Signed)
The patient called for her lab results. Pt. verified x 2 and given neg. HIV result. Pt. told to get it rechecked in 6 mos. Kimberly Jefferson 01/31/2012

## 2012-02-01 NOTE — ED Provider Notes (Signed)
Medical screening examination/treatment/procedure(s) were performed by non-physician practitioner and as supervising physician I was immediately available for consultation/collaboration.  Leslee Home, M.D.   Reuben Likes, MD 02/01/12 2124

## 2012-03-10 ENCOUNTER — Emergency Department (HOSPITAL_COMMUNITY): Payer: Self-pay

## 2012-03-10 ENCOUNTER — Encounter (HOSPITAL_COMMUNITY): Payer: Self-pay | Admitting: *Deleted

## 2012-03-10 ENCOUNTER — Inpatient Hospital Stay (HOSPITAL_COMMUNITY)
Admission: EM | Admit: 2012-03-10 | Discharge: 2012-03-13 | DRG: 812 | Disposition: A | Discharge status: Home / Self Care | Payer: 59 | Attending: Internal Medicine | Admitting: Internal Medicine

## 2012-03-10 DIAGNOSIS — N920 Excessive and frequent menstruation with regular cycle: Secondary | ICD-10-CM | POA: Diagnosis present

## 2012-03-10 DIAGNOSIS — D649 Anemia, unspecified: Secondary | ICD-10-CM | POA: Diagnosis present

## 2012-03-10 DIAGNOSIS — M5030 Other cervical disc degeneration, unspecified cervical region: Secondary | ICD-10-CM | POA: Diagnosis present

## 2012-03-10 DIAGNOSIS — R55 Syncope and collapse: Secondary | ICD-10-CM | POA: Diagnosis present

## 2012-03-10 DIAGNOSIS — R197 Diarrhea, unspecified: Secondary | ICD-10-CM | POA: Diagnosis not present

## 2012-03-10 DIAGNOSIS — A599 Trichomoniasis, unspecified: Secondary | ICD-10-CM | POA: Diagnosis present

## 2012-03-10 DIAGNOSIS — N840 Polyp of corpus uteri: Secondary | ICD-10-CM | POA: Diagnosis present

## 2012-03-10 DIAGNOSIS — I10 Essential (primary) hypertension: Secondary | ICD-10-CM | POA: Diagnosis present

## 2012-03-10 DIAGNOSIS — D500 Iron deficiency anemia secondary to blood loss (chronic): Principal | ICD-10-CM | POA: Diagnosis present

## 2012-03-10 DIAGNOSIS — R200 Anesthesia of skin: Secondary | ICD-10-CM

## 2012-03-10 DIAGNOSIS — Z791 Long term (current) use of non-steroidal anti-inflammatories (NSAID): Secondary | ICD-10-CM

## 2012-03-10 DIAGNOSIS — R51 Headache: Secondary | ICD-10-CM | POA: Diagnosis present

## 2012-03-10 DIAGNOSIS — E876 Hypokalemia: Secondary | ICD-10-CM | POA: Diagnosis not present

## 2012-03-10 DIAGNOSIS — F17200 Nicotine dependence, unspecified, uncomplicated: Secondary | ICD-10-CM | POA: Diagnosis present

## 2012-03-10 DIAGNOSIS — R209 Unspecified disturbances of skin sensation: Secondary | ICD-10-CM | POA: Diagnosis present

## 2012-03-10 DIAGNOSIS — R079 Chest pain, unspecified: Secondary | ICD-10-CM | POA: Diagnosis not present

## 2012-03-10 DIAGNOSIS — A5900 Urogenital trichomoniasis, unspecified: Secondary | ICD-10-CM | POA: Diagnosis present

## 2012-03-10 HISTORY — DX: Essential (primary) hypertension: I10

## 2012-03-10 LAB — CBC WITH DIFFERENTIAL/PLATELET
Eosinophils Relative: 1 % (ref 0–5)
Lymphs Abs: 2.6 10*3/uL (ref 0.7–4.0)
MCH: 18.4 pg — ABNORMAL LOW (ref 26.0–34.0)
MCHC: 27.8 g/dL — ABNORMAL LOW (ref 30.0–36.0)
MCV: 66.3 fL — ABNORMAL LOW (ref 78.0–100.0)
Monocytes Absolute: 0.6 10*3/uL (ref 0.1–1.0)
Platelets: 287 10*3/uL (ref 150–400)
RBC: 4.07 MIL/uL (ref 3.87–5.11)

## 2012-03-10 LAB — COMPREHENSIVE METABOLIC PANEL
ALT: 10 U/L (ref 0–35)
AST: 17 U/L (ref 0–37)
Albumin: 4 g/dL (ref 3.5–5.2)
Alkaline Phosphatase: 70 U/L (ref 39–117)
Chloride: 103 mEq/L (ref 96–112)
Creatinine, Ser: 0.78 mg/dL (ref 0.50–1.10)
Potassium: 3 mEq/L — ABNORMAL LOW (ref 3.5–5.1)
Sodium: 140 mEq/L (ref 135–145)
Total Bilirubin: 0.8 mg/dL (ref 0.3–1.2)

## 2012-03-10 LAB — URINALYSIS, ROUTINE W REFLEX MICROSCOPIC
Bilirubin Urine: NEGATIVE
Glucose, UA: NEGATIVE mg/dL
Ketones, ur: NEGATIVE mg/dL
Nitrite: NEGATIVE
pH: 6 (ref 5.0–8.0)

## 2012-03-10 LAB — RETICULOCYTES: Retic Count, Absolute: 90.2 10*3/uL (ref 19.0–186.0)

## 2012-03-10 LAB — URINE MICROSCOPIC-ADD ON

## 2012-03-10 LAB — POCT I-STAT TROPONIN I: Troponin i, poc: 0 ng/mL (ref 0.00–0.08)

## 2012-03-10 MED ORDER — POTASSIUM CHLORIDE 10 MEQ/100ML IV SOLN
10.0000 meq | Freq: Once | INTRAVENOUS | Status: AC
  Administered 2012-03-11: 10 meq via INTRAVENOUS
  Filled 2012-03-10: qty 100

## 2012-03-10 MED ORDER — SODIUM CHLORIDE 0.9 % IV SOLN
Freq: Once | INTRAVENOUS | Status: AC
  Administered 2012-03-11: via INTRAVENOUS

## 2012-03-10 MED ORDER — POTASSIUM CHLORIDE CRYS ER 20 MEQ PO TBCR
40.0000 meq | EXTENDED_RELEASE_TABLET | Freq: Once | ORAL | Status: AC
  Administered 2012-03-10: 40 meq via ORAL
  Filled 2012-03-10: qty 2

## 2012-03-10 NOTE — ED Notes (Signed)
Blood sugar was 80 per EMT

## 2012-03-10 NOTE — ED Notes (Signed)
Pt. Updated on results and wait time. Pt. Verbalized understanding.

## 2012-03-10 NOTE — ED Notes (Signed)
PT states that for the last couple of weeks has been having periods of left sided numbness.  Pt states first she gets light headed, numb tongue, left arm numb and left face numb.  Pt states left side of upper face and head is hurting so bad she can hardly see.

## 2012-03-10 NOTE — ED Notes (Signed)
Pt states that symptoms started 1.5 hours ago.

## 2012-03-10 NOTE — ED Provider Notes (Signed)
History     CSN: [id number]  Arrival date & time 03/10/12  1600   First MD Initiated Contact with Patient 03/10/12 2233      Chief Complaint  Patient presents with  . Loss of Consciousness    (Consider location/radiation/quality/duration/timing/severity/associated sxs/prior treatment) Patient is a 46 y.o. female presenting with syncope. The history is provided by the patient.  Loss of Consciousness  She states that she has had 3 episodes of syncope in the last 3 weeks. She actually did not have any syncope today. She notes that she gets numbness in the left side of her body which is a prodrome and when she eats ice, it usually goes away. On the 3  Occasions where she passed out, she has not passed out anyway. She's not sure how long she was unconscious for, but believes it is 2-3 minutes. These have never been witnessed.today, she developed a numbness in the left side and she ate ice but then the numbness spread to the right side. All that has since resolved. There is no associated weakness or incoordination. There was no difficulty with speech and no difficulty with vision. She denies headache. She had syncopal episodes like this several years ago and had been worked up and told that it was many strokes. Workup was done through her doctor's office and she had not been admitted to the hospital. 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   Past Medical History  Diagnosis Date  . Hypertension     Past Surgical History  Procedure Date  . Cholecystectomy     No family history on file.  History  Substance Use Topics  . Smoking status: Current Every Day Smoker  . Smokeless tobacco: Not on file  . Alcohol Use: No    OB History    Grav Para Term Preterm Abortions TAB SAB Ect Mult Living                  Review of Systems  Cardiovascular: Positive for syncope.  All other systems reviewed and are  negative.    Allergies  Review of patient's allergies indicates no known allergies.  Home Medications  No current outpatient prescriptions on file.  BP 132/84  Pulse 60  Temp 97.9 F (36.6 C) (Oral)  Resp 16  SpO2 100%  LMP 02/27/2012  Physical Exam  Nursing note and vitals reviewed. 46 year old female, resting comfortably and in no acute distress. Vital signs are normal . Oxygen saturation is 100%, which is normal. Head is normocephalic and atraumatic. PERRLA, EOMI. Oropharynx is clear. Fundi show no hemorrhage, exudate, or papilledema.  Neck is nontender and supple without adenopathy or JVD. There are no carotid bruits.  Back is nontender and there is no CVA tenderness. Lungs are clear without rales, wheezes, or rhonchi. Chest is nontender. Heart has regular rate and rhythm without murmur. Abdomen is soft, flat, nontender without masses or hepatosplenomegaly and peristalsis is normoactive. Extremities have no cyanosis or edema, full range of motion is present. Skin is warm and dry without rash. Neurologic: Mental status is normal, cranial nerves are intact, there are no motor or sensory deficits.   ED Course  Procedures (including critical care time)  Labs Reviewed  COMPREHENSIVE METABOLIC PANEL - Abnormal; Notable for the following:    Potassium 3.0 (*)     All other components within normal limits  CBC WITH DIFFERENTIAL -  Abnormal; Notable for the following:    WBC 10.7 (*)     Hemoglobin 7.5 (*)     HCT 27.0 (*)     MCV 66.3 (*)     MCH 18.4 (*)     MCHC 27.8 (*)     RDW 19.1 (*)     All other components within normal limits  URINALYSIS, ROUTINE W REFLEX MICROSCOPIC - Abnormal; Notable for the following:    APPearance CLOUDY (*)     Leukocytes, UA SMALL (*)     All other components within normal limits  URINE MICROSCOPIC-ADD ON - Abnormal; Notable for the following:    Squamous Epithelial / LPF FEW (*)     Bacteria, UA FEW (*)     All other components  within normal limits  GLUCOSE, CAPILLARY  POCT I-STAT TROPONIN I  URINE CULTURE   Ct Head Wo Contrast  03/10/2012  *RADIOLOGY REPORT*  Clinical Data: Lightheadedness, left sided numbness  CT HEAD WITHOUT CONTRAST  Technique:  Contiguous axial images were obtained from the base of the skull through the vertex without contrast.  Comparison: 09/22/2004  Findings: Early mineralization in bilateral basal ganglia. There is no evidence of acute intracranial hemorrhage, brain edema, mass lesion, acute infarction,   mass effect, or midline shift. Acute infarct may be inapparent on noncontrast CT.  No other intra-axial abnormalities are seen, and the ventricles and sulci are within normal limits in size and symmetry.   No abnormal extra-axial fluid collections or masses are identified.  No significant calvarial abnormality.  IMPRESSION: 1. Negative for bleed or other acute intracranial process.   Original Report Authenticated By: D. Andria Rhein, MD     Date: 03/10/2012  Rate: 77  Rhythm: normal sinus rhythm  QRS Axis: normal  Intervals: normal  ST/T Wave abnormalities: nonspecific T wave changes  Conduction Disutrbances:none  Narrative Interpretation: Minor, nonspecific T wave changes. No prior ECG available for comparison.  Old EKG Reviewed: none available    1. Numbness   2. Anemia       MDM  Episodes of numbness and with occasional syncope. I am concerned that these may represent seizures, although they could conceivably represent TIAs. The episode today where the numbness spread to both sides would be far more consistent with a seizure than with TIA. Anemia which is probably due to heavy menses. Patient is refused to allow me to do a rectal exam, so Hemoccult testing has not been done today. She will be admitted for combined workup of TIA, anemia, and possible seizure disorder. Case is discussed with Dr. Toniann Fail of [facility] who agrees to admit the patient.        Dione Booze, MD 03/11/12 [phone]

## 2012-03-10 NOTE — ED Notes (Signed)
Pt told tech that she wants to be checked for STD's because "something is wrong with her"

## 2012-03-10 NOTE — ED Notes (Signed)
Ran information by Dr. Ranae Palms and not concerned at this time for stroke.  Pt states she eats bags of ice per day

## 2012-03-11 ENCOUNTER — Encounter (HOSPITAL_COMMUNITY): Payer: Self-pay | Admitting: Internal Medicine

## 2012-03-11 ENCOUNTER — Inpatient Hospital Stay (HOSPITAL_COMMUNITY): Payer: Self-pay

## 2012-03-11 ENCOUNTER — Observation Stay (HOSPITAL_COMMUNITY): Payer: Self-pay

## 2012-03-11 DIAGNOSIS — D649 Anemia, unspecified: Secondary | ICD-10-CM

## 2012-03-11 DIAGNOSIS — R55 Syncope and collapse: Secondary | ICD-10-CM

## 2012-03-11 DIAGNOSIS — R200 Anesthesia of skin: Secondary | ICD-10-CM | POA: Diagnosis present

## 2012-03-11 DIAGNOSIS — R209 Unspecified disturbances of skin sensation: Secondary | ICD-10-CM

## 2012-03-11 LAB — HEMOGLOBIN A1C
Hgb A1c MFr Bld: 5.1 % (ref <5.7)
Mean Plasma Glucose: 100 mg/dL (ref <117)

## 2012-03-11 LAB — CBC WITH DIFFERENTIAL/PLATELET
Basophils Absolute: 0 10*3/uL (ref 0.0–0.1)
Lymphs Abs: 1.9 10*3/uL (ref 0.7–4.0)
MCV: 67.5 fL — ABNORMAL LOW (ref 78.0–100.0)
Monocytes Relative: 7 % (ref 3–12)
Neutrophils Relative %: 68 % (ref 43–77)
Platelets: 214 10*3/uL (ref 150–400)
RDW: 19.6 % — ABNORMAL HIGH (ref 11.5–15.5)
WBC: 8.3 10*3/uL (ref 4.0–10.5)

## 2012-03-11 LAB — COMPREHENSIVE METABOLIC PANEL
Albumin: 3.3 g/dL — ABNORMAL LOW (ref 3.5–5.2)
Alkaline Phosphatase: 61 U/L (ref 39–117)
BUN: 5 mg/dL — ABNORMAL LOW (ref 6–23)
CO2: 24 mEq/L (ref 19–32)
Chloride: 106 mEq/L (ref 96–112)
GFR calc Af Amer: >90 mL/min (ref >90)
GFR calc non Af Amer: >90 mL/min (ref >90)
Glucose, Bld: 112 mg/dL — ABNORMAL HIGH (ref 70–99)
Potassium: 3.4 mEq/L — ABNORMAL LOW (ref 3.5–5.1)
Total Bilirubin: 0.7 mg/dL (ref 0.3–1.2)

## 2012-03-11 LAB — PREPARE RBC (CROSSMATCH)

## 2012-03-11 LAB — TSH: TSH: 0.895 u[IU]/mL (ref 0.350–4.500)

## 2012-03-11 LAB — LIPID PANEL
Cholesterol: 142 mg/dL (ref 0–200)
HDL: 49 mg/dL (ref >39)
Total CHOL/HDL Ratio: 2.9 RATIO

## 2012-03-11 LAB — VITAMIN B12: Vitamin B-12: 466 pg/mL (ref 211–911)

## 2012-03-11 LAB — GLUCOSE, CAPILLARY: Glucose-Capillary: 104 mg/dL — ABNORMAL HIGH (ref 70–99)

## 2012-03-11 LAB — RAPID URINE DRUG SCREEN, HOSP PERFORMED
Amphetamines: NOT DETECTED
Opiates: NOT DETECTED
Tetrahydrocannabinol: NOT DETECTED

## 2012-03-11 LAB — ABO/RH: ABO/RH(D): O POS

## 2012-03-11 LAB — IRON AND TIBC: Iron: 14 ug/dL — ABNORMAL LOW (ref 42–135)

## 2012-03-11 MED ORDER — PANTOPRAZOLE SODIUM 40 MG PO TBEC
40.0000 mg | DELAYED_RELEASE_TABLET | Freq: Every day | ORAL | Status: DC
  Administered 2012-03-11 – 2012-03-13 (×3): 40 mg via ORAL
  Filled 2012-03-11 (×3): qty 1

## 2012-03-11 MED ORDER — SODIUM CHLORIDE 0.9 % IV SOLN
INTRAVENOUS | Status: DC
  Administered 2012-03-11: 03:00:00 via INTRAVENOUS

## 2012-03-11 MED ORDER — ONDANSETRON HCL 4 MG/2ML IJ SOLN: 4.0000 mg | Freq: Three times a day (TID) | INTRAMUSCULAR | Status: AC | PRN

## 2012-03-11 MED ORDER — INFLUENZA VIRUS VACC SPLIT PF IM SUSP
0.5000 mL | INTRAMUSCULAR | Status: AC
  Filled 2012-03-11: qty 0.5

## 2012-03-11 MED ORDER — ACETAMINOPHEN 325 MG PO TABS: 650.0000 mg | ORAL_TABLET | ORAL | Status: DC | PRN

## 2012-03-11 MED ORDER — PNEUMOCOCCAL VAC POLYVALENT 25 MCG/0.5ML IJ INJ
0.5000 mL | INJECTION | INTRAMUSCULAR | Status: AC
  Filled 2012-03-11: qty 0.5

## 2012-03-11 NOTE — ED Notes (Signed)
Pt requests something to eat.

## 2012-03-11 NOTE — ED Notes (Signed)
Pt given chicken broth and sprite.

## 2012-03-11 NOTE — Progress Notes (Signed)
[facility]                             Progress note I have seen and examined pt 46 year old female admitted this am per Dr Toniann Fail with history of tobacco abuse presents with complaints of left-sided numbness and syncopal episodes x2 weeks .In the ER patient was  found to have severe anemia. She does complain of heavy menstrual cycles for the last 6 months which she had discussed with her primary care physician who was planning to refer her to a gynecologist. Patient denied any blood in the stools and refused rectal exam. She admitted to taking NSAIDs for last 2-3 week because of increasing neck pain and headache. CT head in ED did not show any acute has been admitted for further management.MRI of brain is neg this am, MRI neck withC5-C6 and C6-C7 disc degeneration. Evidence of left C7 foraminal stenosis at the latter. No definite spinal stenosis- but MRI neck not completed due to pt not tolerating. hgb this am is 6.9- will transfuse 2u prbc, follow and recheck. She admits to dizziness and decreased energy levels. Will obtain complete pelvic US and follow, also obtain echo and follow, further recs pending studies.   Signed: Donnalee Curry  [facility] [phone]

## 2012-03-11 NOTE — Progress Notes (Signed)
Asked by Ultrasound to wait to give blood to patient until after she is back from Pelvic Ultrasound. MD Viyouh aware.  Minor, Kimberly Jefferson

## 2012-03-11 NOTE — H&P (Signed)
Kimberly Jefferson is an 46 y.o. female.   Patient was seen and examined on March 11, 2012. PCP - Dr. Mirna Mires. Chief Complaint: Loss of consciousness and left-sided numbness.  HPI chest:  46 year old female with history of tobacco abuse presents with complaints of left-sided numbness and syncopal episodes. Patient has been having these episodes over the last 2 weeks. She has had at least 3 episodes during that period. Patient states that her body gets numb on the left side along the tongue and face. After about one minute she lose consciousness. She regains consciousness spontaneously within a minute. There was no associated tongue bite or incontinence of urine. Denies any chest pain shortness of breath or palpitations. Patient does not have any orthostatic symptoms. Since she had similar symptom yesterday she presented the ER. In the ER patient was initially found to have severe anemia. She does complain of heavy menstrual cycles for the last 6 months which she had discussed with her primary care physician was planning to refer her to a gynecologist. Patient denies any blood in the stools and has refused rectal exam. Patient however has been taking NSAIDs for last 2-3 week because of increasing neck pain and headache. Patient has had a CT head which did not show any acute has been admitted for further management.   Past Medical History  Diagnosis Date  . Hypertension     Past Surgical History  Procedure Date  . Cholecystectomy     Family History  Problem Relation Age of Onset  . Breast cancer Maternal Aunt    Social History:  reports that she has been smoking.  She does not have any smokeless tobacco history on file. She reports that she does not drink alcohol or use illicit drugs.  Allergies: No Known Allergies   (Not in a hospital admission)  Results for orders placed during the hospital encounter of 03/10/12 (from the past 48 hour(s))  GLUCOSE, CAPILLARY     Status: Normal   Collection Time   03/10/12  4:37 PM      Component Value Range Comment   Glucose-Capillary 80  70 - 99 mg/dL    Comment 1 Notify RN     COMPREHENSIVE METABOLIC PANEL     Status: Abnormal   Collection Time   03/10/12  5:24 PM      Component Value Range Comment   Sodium 140  135 - 145 mEq/L    Potassium 3.0 (*) 3.5 - 5.1 mEq/L    Chloride 103  96 - 112 mEq/L    CO2 27  19 - 32 mEq/L    Glucose, Bld 79  70 - 99 mg/dL    BUN 7  6 - 23 mg/dL    Creatinine, Ser 1.61  0.50 - 1.10 mg/dL    Calcium 8.9  8.4 - 09.6 mg/dL    Total Protein 6.9  6.0 - 8.3 g/dL    Albumin 4.0  3.5 - 5.2 g/dL    AST 17  0 - 37 U/L    ALT 10  0 - 35 U/L    Alkaline Phosphatase 70  39 - 117 U/L    Total Bilirubin 0.8  0.3 - 1.2 mg/dL    GFR calc non Af Amer >90  >90 mL/min    GFR calc Af Amer >90  >90 mL/min   CBC WITH DIFFERENTIAL     Status: Abnormal   Collection Time   03/10/12  5:24 PM  Component Value Range Comment   WBC 10.7 (*) 4.0 - 10.5 K/uL    RBC 4.07  3.87 - 5.11 MIL/uL    Hemoglobin 7.5 (*) 12.0 - 15.0 g/dL    HCT 16.1 (*) 09.6 - 46.0 %    MCV 66.3 (*) 78.0 - 100.0 fL    MCH 18.4 (*) 26.0 - 34.0 pg    MCHC 27.8 (*) 30.0 - 36.0 g/dL    RDW 04.5 (*) 40.9 - 15.5 %    Platelets 287  150 - 400 K/uL    Neutrophils Relative 68  43 - 77 %    Lymphocytes Relative 24  12 - 46 %    Monocytes Relative 6  3 - 12 %    Eosinophils Relative 1  0 - 5 %    Basophils Relative 1  0 - 1 %    Neutro Abs 7.3  1.7 - 7.7 K/uL    Lymphs Abs 2.6  0.7 - 4.0 K/uL    Monocytes Absolute 0.6  0.1 - 1.0 K/uL    Eosinophils Absolute 0.1  0.0 - 0.7 K/uL    Basophils Absolute 0.1  0.0 - 0.1 K/uL    RBC Morphology POLYCHROMASIA PRESENT   ELLIPTOCYTES  POCT I-STAT TROPONIN I     Status: Normal   Collection Time   03/10/12  6:13 PM      Component Value Range Comment   Troponin i, poc 0.00  0.00 - 0.08 ng/mL    Comment 3            URINALYSIS, ROUTINE W REFLEX MICROSCOPIC     Status: Abnormal   Collection Time    03/10/12  9:45 PM      Component Value Range Comment   Color, Urine YELLOW  YELLOW    APPearance CLOUDY (*) CLEAR    Specific Gravity, Urine 1.009  1.005 - 1.030    pH 6.0  5.0 - 8.0    Glucose, UA NEGATIVE  NEGATIVE mg/dL    Hgb urine dipstick NEGATIVE  NEGATIVE    Bilirubin Urine NEGATIVE  NEGATIVE    Ketones, ur NEGATIVE  NEGATIVE mg/dL    Protein, ur NEGATIVE  NEGATIVE mg/dL    Urobilinogen, UA 0.2  0.0 - 1.0 mg/dL    Nitrite NEGATIVE  NEGATIVE    Leukocytes, UA SMALL (*) NEGATIVE   URINE MICROSCOPIC-ADD ON     Status: Abnormal   Collection Time   03/10/12  9:45 PM      Component Value Range Comment   Squamous Epithelial / LPF FEW (*) RARE    WBC, UA 3-6  <3 WBC/hpf    RBC / HPF 0-2  <3 RBC/hpf    Bacteria, UA FEW (*) RARE    Urine-Other TRICHOMONAS PRESENT     RETICULOCYTES     Status: Normal   Collection Time   03/10/12 10:44 PM      Component Value Range Comment   Retic Ct Pct 2.3  0.4 - 3.1 %    RBC. 3.92  3.87 - 5.11 MIL/uL    Retic Count, Manual 90.2  19.0 - 186.0 K/uL   TYPE AND SCREEN     Status: Normal   Collection Time   03/10/12 10:55 PM      Component Value Range Comment   ABO/RH(D) O POS      Antibody Screen NEG      Sample Expiration 03/13/2012     ABO/RH     Status: Normal (  Preliminary result)   Collection Time   03/10/12 10:55 PM      Component Value Range Comment   ABO/RH(D) O POS     OCCULT BLOOD, POC DEVICE     Status: Normal   Collection Time   03/11/12 12:24 AM      Component Value Range Comment   Fecal Occult Bld NEGATIVE      Ct Head Wo Contrast  03/10/2012  *RADIOLOGY REPORT*  Clinical Data: Lightheadedness, left sided numbness  CT HEAD WITHOUT CONTRAST  Technique:  Contiguous axial images were obtained from the base of the skull through the vertex without contrast.  Comparison: 09/22/2004  Findings: Early mineralization in bilateral basal ganglia. There is no evidence of acute intracranial hemorrhage, brain edema, mass lesion, acute  infarction,   mass effect, or midline shift. Acute infarct may be inapparent on noncontrast CT.  No other intra-axial abnormalities are seen, and the ventricles and sulci are within normal limits in size and symmetry.   No abnormal extra-axial fluid collections or masses are identified.  No significant calvarial abnormality.  IMPRESSION: 1. Negative for bleed or other acute intracranial process.   Original Report Authenticated By: D. Andria Rhein, MD     Review of Systems  Constitutional: Negative.   HENT: Negative.   Eyes: Negative.   Respiratory: Negative.   Cardiovascular: Negative.   Gastrointestinal: Negative.   Genitourinary: Negative.   Skin: Negative.   Neurological: Positive for loss of consciousness.       Left side numbness.  Endo/Heme/Allergies: Negative.     Blood pressure 132/80, pulse 67, temperature 97.9 F (36.6 C), temperature source Oral, resp. rate 16, last menstrual period 02/27/2012, SpO2 100.00%. Physical Exam  Constitutional: She is oriented to person, place, and time. She appears well-developed and well-nourished. No distress.  HENT:  Head: Normocephalic and atraumatic.  Right Ear: External ear normal.  Left Ear: External ear normal.  Nose: Nose normal.  Mouth/Throat: Oropharynx is clear and moist. No oropharyngeal exudate.  Eyes: Conjunctivae normal are normal. Pupils are equal, round, and reactive to light. Right eye exhibits no discharge. Left eye exhibits no discharge. No scleral icterus.  Neck: Normal range of motion. Neck supple.  Cardiovascular: Normal rate and regular rhythm.   Respiratory: Effort normal and breath sounds normal. No respiratory distress. She has no wheezes. She has no rales.  GI: Soft. Bowel sounds are normal. She exhibits no distension. There is no tenderness. There is no rebound.  Musculoskeletal: She exhibits no edema and no tenderness.  Neurological: She is alert and oriented to person, place, and time.       Move all extremities  5/5. No facial asymmetry.  Skin: Skin is warm and dry. She is not diaphoretic.     Assessment/Plan #1. Syncope with left-sided numbness - since patient has neck pain at this time we will get an MRI of the C-spine to make sure there is no lesions involving the cord though patient denies any incontinence of urine are cc and does not have hyperreflexia. Recent diarrhea also get MRI of the brain. Patient will be placed on neurochecks until then. Since the symptoms are happening with a prodrome we will get an EEG. On the other hand her symptoms may be related to anemia though patient is not orthostatic. #2. Severe anemia - this may be related to the severe menstrual cycle she has. Patient has refused rectal exam. Recheck CBC in a.m. Stool for occult blood to be tested once patient moves her  bowels. Type and screen and check anemia panel. #3. Tobacco abuse - advised to quit smoking. Check chest x-ray.  CODE STATUS - full code.  Earle Burson N. 03/11/2012, 1:14 AM

## 2012-03-11 NOTE — Progress Notes (Signed)
Critical HgB value paged to MD Viyouh this am. No orders at this time  Minor, [employer]

## 2012-03-11 NOTE — Progress Notes (Signed)
First unit PRBCs started at 1700  Minor, Yvette Rack

## 2012-03-12 ENCOUNTER — Other Ambulatory Visit: Payer: Self-pay

## 2012-03-12 ENCOUNTER — Encounter (HOSPITAL_COMMUNITY): Payer: Self-pay | Admitting: Internal Medicine

## 2012-03-12 DIAGNOSIS — R079 Chest pain, unspecified: Secondary | ICD-10-CM | POA: Diagnosis not present

## 2012-03-12 DIAGNOSIS — A599 Trichomoniasis, unspecified: Secondary | ICD-10-CM | POA: Diagnosis present

## 2012-03-12 DIAGNOSIS — I059 Rheumatic mitral valve disease, unspecified: Secondary | ICD-10-CM

## 2012-03-12 DIAGNOSIS — I10 Essential (primary) hypertension: Secondary | ICD-10-CM

## 2012-03-12 HISTORY — DX: Essential (primary) hypertension: I10

## 2012-03-12 LAB — BASIC METABOLIC PANEL
CO2: 26 mEq/L (ref 19–32)
Calcium: 8.7 mg/dL (ref 8.4–10.5)
Creatinine, Ser: 0.79 mg/dL (ref 0.50–1.10)
GFR calc Af Amer: >90 mL/min (ref >90)
GFR calc non Af Amer: >90 mL/min (ref >90)
Sodium: 138 mEq/L (ref 135–145)

## 2012-03-12 LAB — CK TOTAL AND CKMB (NOT AT ARMC)
CK, MB: 1.4 ng/mL (ref 0.3–4.0)
Relative Index: INVALID (ref 0.0–2.5)
Total CK: 93 U/L (ref 7–177)
Total CK: 93 U/L (ref 7–177)

## 2012-03-12 LAB — TYPE AND SCREEN
ABO/RH(D): O POS
Unit division: 0

## 2012-03-12 LAB — TROPONIN I: Troponin I: <0.3 ng/mL (ref <0.30)

## 2012-03-12 LAB — CLOSTRIDIUM DIFFICILE BY PCR: Toxigenic C. Difficile by PCR: NEGATIVE

## 2012-03-12 LAB — CBC
MCH: 20.4 pg — ABNORMAL LOW (ref 26.0–34.0)
MCV: 69.6 fL — ABNORMAL LOW (ref 78.0–100.0)
Platelets: 214 10*3/uL (ref 150–400)
RBC: 4.6 MIL/uL (ref 3.87–5.11)
RDW: 20.7 % — ABNORMAL HIGH (ref 11.5–15.5)

## 2012-03-12 LAB — FOLATE: Folate: 3.6 ng/mL — ABNORMAL LOW (ref >5.4)

## 2012-03-12 LAB — URINE CULTURE

## 2012-03-12 MED ORDER — METRONIDAZOLE 500 MG PO TABS
2000.0000 mg | ORAL_TABLET | Freq: Once | ORAL | Status: AC
  Administered 2012-03-12: 2000 mg via ORAL
  Filled 2012-03-12 (×2): qty 4

## 2012-03-12 NOTE — Progress Notes (Signed)
[facility] PROGRESS NOTE  Kimberly Jefferson WUJ:[id number] DOB: 1966-02-22 DOA: 03/10/2012 PCP: Evlyn Courier, MD  Assessment/Plan:  #1 syncope with left-sided numbness Unknown etiology. MRI of the head and C-spine were negative for any cord compression or acute CVA. EEG which was done was negative for any epileptiform discharges. Likely secondary to patient's anemia. Patient is not orthostatic. Clinical improvement status post transfusion of packed red blood cells. Follow.  #2 severe anemia Likely secondary to severe menstrual cycles. Pelvic ultrasound with fibroids. Patient is status post 2 units packed red blood cells. H&H has improved to 9.4. Follow. Will need to followup with GYN as outpatient.  #3 Trichomonas in the urine Will give a one-time dose of Flagyl 2000 mg by mouth x1.  #4 chest pain We'll cycle cardiac enzymes every 8 hours x3. Will check a 2-D echo. We'll check a EKG. Follow.  #5 tobacco abuse Tobacco cessation.  #6 diarrhea C. difficile PCR pending.  #7 prophylaxis SCDs for DVT prophylaxis.  Code Status: Full Family Communication: Updated patient and family at bedside. Disposition Plan: Home when medically stable.   Consultants:  None  Procedures:  2-D echo 03/12/2012  MRI of the head and C-spine 03/11/2012  CT head 03/10/2012  Chest x-ray 03/11/2012  Trans-vaginal ultrasound 03/11/2012  Status post 2 units of packed red blood cells 03/10/2012  Antibiotics:  None  HPI/Subjective: Patient states that left-sided numbness and weakness have improved. Patient denies any further syncopal episodes. Patient states he feels much better. Patient complaining of some substernal chest pain when she lays flat he was like a weight on a chest with some complaints of shortness of breath. Patient wanted to go home.  Objective: Filed Vitals:   03/12/12 0622 03/12/12 1000 03/12/12 1328 03/12/12 1802  BP: 127/63 127/88 123/63 121/69  Pulse: 53 53 60 66    Temp: 97.8 F (36.6 C) 97.5 F (36.4 C) 98.1 F (36.7 C) 97.3 F (36.3 C)  TempSrc: Oral Oral Oral Oral  Resp: 20 20 20 20   Height:      Weight:      SpO2: 100% 100% 100% 100%    Intake/Output Summary (Last 24 hours) at 03/12/12 1920 Last data filed at 03/12/12 1700  Gross per 24 hour  Intake 1422.5 ml  Output      0 ml  Net 1422.5 ml   Filed Weights   03/11/12 0211  Weight: 64.411 kg (142 lb)    Exam:   General:  NAD  Cardiovascular: RRR no m/r/g. No JVD. No c/c/e  Respiratory: CTAB  Abdomen: Soft, nontender, nondistended, positive bowel sounds  Data Reviewed: Basic Metabolic Panel:  Lab 03/12/12 9562 03/11/12 0624 03/10/12 1724  NA 138 139 140  K 3.6 3.4* 3.0*  CL 105 106 103  CO2 26 24 27   GLUCOSE 91 112* 79  BUN 5* 5* 7  CREATININE 0.79 0.69 0.78  CALCIUM 8.7 8.2* 8.9  MG -- -- --  PHOS -- -- --   Liver Function Tests:  Lab 03/11/12 0624 03/10/12 1724  AST 19 17  ALT 11 10  ALKPHOS 61 70  BILITOT 0.7 0.8  PROT 6.1 6.9  ALBUMIN 3.3* 4.0   No results found for this basename: LIPASE:5,AMYLASE:5 in the last 168 hours No results found for this basename: AMMONIA:5 in the last 168 hours CBC:  Lab 03/12/12 0545 03/11/12 0624 03/10/12 1724  WBC 8.8 8.3 10.7*  NEUTROABS -- 5.6 7.3  HGB 9.4* 6.9* 7.5*  HCT 32.0* 25.1* 27.0*  MCV 69.6* 67.5* 66.3*  PLT 214 214 287   Cardiac Enzymes:  Lab 03/12/12 1528  CKTOTAL 93  CKMB 1.6  CKMBINDEX --  TROPONINI <0.30   BNP (last 3 results) No results found for this basename: PROBNP:3 in the last 8760 hours CBG:  Lab 03/11/12 0218 03/10/12 1637  GLUCAP 104* 80    Recent Results (from the past 240 hour(s))  URINE CULTURE     Status: Normal   Collection Time   03/10/12  9:45 PM      Component Value Range Status Comment   Specimen Description URINE, RANDOM   Final    Special Requests NONE   Final    Culture  Setup Time 03/11/2012 06:51   Final    Colony Count >=100,000 COLONIES/ML   Final     Culture     Final    Value: Multiple bacterial morphotypes present, none predominant. Suggest appropriate recollection if clinically indicated.   Report Status 03/12/2012 FINAL   Final   CLOSTRIDIUM DIFFICILE BY PCR     Status: Normal   Collection Time   03/12/12  2:15 PM      Component Value Range Status Comment   C difficile by pcr NEGATIVE  NEGATIVE Final      Studies: Mri Brain Without Contrast  03/11/2012  *RADIOLOGY REPORT*  Clinical Data: 46 year old female with headache, left side numbness, hypertension, lightheadedness.  MRI HEAD WITHOUT CONTRAST  Technique:  Multiplanar, multiecho pulse sequences of the brain and surrounding structures were obtained according to standard protocol without intravenous contrast.  Comparison: Head CTs 03/10/2012 and earlier.  Findings: Normal cerebral volume. No restricted diffusion to suggest acute infarction.  No midline shift, mass effect, evidence of mass lesion, ventriculomegaly, extra-axial collection or acute intracranial hemorrhage.  Cervicomedullary junction and pituitary are within normal limits.  Major intracranial vascular flow voids are preserved. Wallace Cullens and white matter signal is within normal limits throughout the brain.  Negative visualized cervical spine.  Visualized orbit soft tissues are within normal limits.  Minimal paranasal sinus mucosal thickening.  Mastoids are clear.  Negative scalp soft tissues. Visualized bone marrow signal is within normal limits.  IMPRESSION: Normal noncontrast MRI appearance of the brain.   Original Report Authenticated By: Erskine Speed, M.D.    Mr Cervical Spine Wo Contrast  03/11/2012  *RADIOLOGY REPORT*  Clinical Data: 46 year old female with neck pain.  Left side numbness.  The examination had to be discontinued prior to completion due to patient claustrophobia/inability to complete.  MRI CERVICAL SPINE WITHOUT CONTRAST  Technique:  Multiplanar and multiecho pulse sequences of the cervical spine, to include the  craniocervical junction and cervicothoracic junction, were obtained according to standard protocol without intravenous contrast.  Comparison: Brain MRI from the same day reported separately. Cervical spine radiographs 04/13/2007.  Findings: Sagittal imaging only was obtained.  The sagittal T2- weighted imaging is degraded by motion.  Stable mild reversal of cervical lordosis. No marrow edema or evidence of acute osseous abnormality.  Visualized paraspinal soft tissues are within normal limits.  Cervicomedullary junction is within normal limits.  Probable prominent central spinal canal, most apparent at C6-C7. No abnormal decreased T1 signal in the visible spinal cord.  C2-C3:  Negative.  C3-C4:  Mild uncovertebral hypertrophy.  No definite stenosis.  C4-C5:  Anterior eccentric disc bulge.  No stenosis.  C5-C6:  Small central disc protrusion.  Narrowing of the ventral CSF space but no definite spinal stenosis.  No foraminal stenosis is evident.  C6-C7:  Small central disc protrusion narrowing ventral CSF space. No definite spinal stenosis.  Bilateral uncovertebral hypertrophy. Mild left greater than right C7 foraminal stenosis.  C7-T1:  Negative.  Visualized upper thoracic levels are negative.  IMPRESSION: 1. The examination had to be discontinued prior to completion such that only sagittal imaging was obtained.  2.  C5-C6 and C6-C7 disc degeneration.  Evidence of left C7 foraminal stenosis at the latter.  No definite spinal stenosis.   Original Report Authenticated By: Erskine Speed, M.D.    US Transvaginal Non-ob  03/11/2012  *RADIOLOGY REPORT*  Clinical Data: Menorrhagia.  LMP 02/27/2012.  TRANSABDOMINAL AND TRANSVAGINAL ULTRASOUND OF PELVIS  Technique:  Both transabdominal and transvaginal ultrasound examinations of the pelvis were performed.  Transabdominal technique was performed for global imaging of the pelvis including uterus, ovaries, adnexal regions, and pelvic cul-de-sac.  It was necessary to proceed  with endovaginal exam following the transabdominal exam to visualize the endometrium and myometrium.  Comparison:  CT on 03/22/2007  Findings: Uterus:  11.3 x 6.7 x 6.9 cm.  Diffusely heterogeneous echotexture of the uterine myometrium is seen, however no distinct fibroids are identified.  Endometrium: Indistinct endometrial- myometrial junction.  Double layer endometrial thickness measures approximately 15 mm transvaginally.  A focal echogenic lesion is seen in the fundal portion of the endometrial cavity measuring approximately 7 x 10 mm, suspicious for endometrial polyp.  Right ovary: 2.1 x 1.3 x 1.9 cm.  Normal appearance.  Left ovary: 2.3 x 1.9 x 2.2 cm.  Normal appearance.  Other Findings:  Tiny amount of free fluid in pelvic cul-de-sac.  IMPRESSION:  1.  Heterogeneous uterine myometrium, without distinct fibroids. 2.  7 x 10 mm focal lesion in the endometrial cavity, suspicious for endometrial polyp.  Consider further evaluation with sonohysterogram for confirmation prior to hysteroscopy. Endometrial sampling should also be considered if patient is at high risk for endometrial carcinoma. (Ref:  Radiological Reasoning: Algorithmic Workup of Abnormal Vaginal Bleeding with Endovaginal Sonography and Sonohysterography. AJR 2008; 161:W96-04) 3.  Normal appearance of both ovaries.  No adnexal mass identified.   Original Report Authenticated By: Myles Rosenthal, M.D.    US Pelvis Complete  03/11/2012  *RADIOLOGY REPORT*  Clinical Data: Menorrhagia.  LMP 02/27/2012.  TRANSABDOMINAL AND TRANSVAGINAL ULTRASOUND OF PELVIS  Technique:  Both transabdominal and transvaginal ultrasound examinations of the pelvis were performed.  Transabdominal technique was performed for global imaging of the pelvis including uterus, ovaries, adnexal regions, and pelvic cul-de-sac.  It was necessary to proceed with endovaginal exam following the transabdominal exam to visualize the endometrium and myometrium.  Comparison:  CT on 03/22/2007   Findings: Uterus:  11.3 x 6.7 x 6.9 cm.  Diffusely heterogeneous echotexture of the uterine myometrium is seen, however no distinct fibroids are identified.  Endometrium: Indistinct endometrial- myometrial junction.  Double layer endometrial thickness measures approximately 15 mm transvaginally.  A focal echogenic lesion is seen in the fundal portion of the endometrial cavity measuring approximately 7 x 10 mm, suspicious for endometrial polyp.  Right ovary: 2.1 x 1.3 x 1.9 cm.  Normal appearance.  Left ovary: 2.3 x 1.9 x 2.2 cm.  Normal appearance.  Other Findings:  Tiny amount of free fluid in pelvic cul-de-sac.  IMPRESSION:  1.  Heterogeneous uterine myometrium, without distinct fibroids. 2.  7 x 10 mm focal lesion in the endometrial cavity, suspicious for endometrial polyp.  Consider further evaluation with sonohysterogram for confirmation prior to hysteroscopy. Endometrial sampling should also be considered if patient is  at high risk for endometrial carcinoma. (Ref:  Radiological Reasoning: Algorithmic Workup of Abnormal Vaginal Bleeding with Endovaginal Sonography and Sonohysterography. AJR 2008; 161:W96-04) 3.  Normal appearance of both ovaries.  No adnexal mass identified.   Original Report Authenticated By: Myles Rosenthal, M.D.    Dg Chest Port 1 View  03/11/2012  *RADIOLOGY REPORT*  Clinical Data: Shortness of breath, chest pain, dizziness, question infiltrate  PORTABLE CHEST - 1 VIEW  Comparison: Portable exam 0139 hours compared to 07/15/2011  Findings: Upper-normal size of cardiac silhouette. Mediastinal contours and pulmonary vascularity normal. Lungs clear. No pleural effusion or pneumothorax. Osseous structures unremarkable.  IMPRESSION: No acute abnormalities.   Original Report Authenticated By: Ulyses Southward, M.D.     Scheduled Meds:   . influenza  inactive virus vaccine  0.5 mL Intramuscular Tomorrow-1000  . pantoprazole  40 mg Oral Q1200  . pneumococcal 23 valent vaccine  0.5 mL Intramuscular  Tomorrow-1000   Continuous Infusions:   . sodium chloride 20 mL/hr at 03/11/12 5409    Principal Problem:  *Left sided numbness Active Problems:  Syncope  Anemia  Chest pain  HTN (hypertension)    Time spent: . 35 mins    [facility]  [facility] Pager [phone]. If 8PM-8AM, please contact night-coverage at [email or web address], password [email or web address] 03/12/2012, 7:20 PM  LOS: 2 days

## 2012-03-12 NOTE — Progress Notes (Signed)
*  PRELIMINARY RESULTS* Echocardiogram 2D Echocardiogram has been performed.  Kimberly Jefferson 03/12/2012, 4:53 PM

## 2012-03-12 NOTE — Procedures (Signed)
EEG NUMBER:  [id number].  REFERRING PHYSICIAN:  Kela Millin, MD  INDICATION FOR STUDY:  A 46 year old lady who was admitted following a syncopal spell.  She has been experiencing intermittent episodes of numbness involving the left side.  Study is being performed to rule out possible focal seizure disorder.  DESCRIPTION:  This was a routine EEG recording performed during wakefulness.  Predominant background activity consisted of 10 Hz symmetrical alpha rhythm.  Photic stimulation produced a symmetrical occipital driving response.  Hyperventilation was not performed.  No epileptiform discharges were recorded.  There were no areas of abnormal slowing.  INTERPRETATION:  This is a normal EEG recording during wakefulness.  No evidence of an epileptic disorder was demonstrated.     Noel Christmas, MD    [id number] D:  03/11/2012 18:05:37  T:  03/12/2012 11:16:48  Job #:  [id number]

## 2012-03-13 DIAGNOSIS — E876 Hypokalemia: Secondary | ICD-10-CM | POA: Diagnosis not present

## 2012-03-13 LAB — BASIC METABOLIC PANEL
BUN: 10 mg/dL (ref 6–23)
Calcium: 8.7 mg/dL (ref 8.4–10.5)
Chloride: 105 mEq/L (ref 96–112)
Creatinine, Ser: 0.97 mg/dL (ref 0.50–1.10)
GFR calc Af Amer: 81 mL/min — ABNORMAL LOW (ref >90)
GFR calc non Af Amer: 70 mL/min — ABNORMAL LOW (ref >90)

## 2012-03-13 LAB — CBC
HCT: 31.5 % — ABNORMAL LOW (ref 36.0–46.0)
MCHC: 29.2 g/dL — ABNORMAL LOW (ref 30.0–36.0)
Platelets: 176 10*3/uL (ref 150–400)
RDW: 21.3 % — ABNORMAL HIGH (ref 11.5–15.5)
WBC: 9.1 10*3/uL (ref 4.0–10.5)

## 2012-03-13 LAB — CK TOTAL AND CKMB (NOT AT ARMC)
CK, MB: 1.5 ng/mL (ref 0.3–4.0)
Relative Index: INVALID (ref 0.0–2.5)
Total CK: 81 U/L (ref 7–177)

## 2012-03-13 MED ORDER — FERROUS SULFATE 325 (65 FE) MG PO TABS: 325.0000 mg | ORAL_TABLET | Freq: Three times a day (TID) | ORAL | Status: DC

## 2012-03-13 MED ORDER — POTASSIUM CHLORIDE CRYS ER 20 MEQ PO TBCR
40.0000 meq | EXTENDED_RELEASE_TABLET | Freq: Once | ORAL | Status: DC
  Filled 2012-03-13: qty 2

## 2012-03-13 NOTE — Discharge Summary (Signed)
Physician Discharge Summary  Kimberly Jefferson:[id number] DOB: 31-May-1965 DOA: 03/10/2012  PCP: Evlyn Courier, MD  Admit date: 03/10/2012 Discharge date: 03/13/2012  Time spent: 60 minutes  Recommendations for Outpatient Follow-up:  1. Followup with PCP one week post discharge. On followup CBC will need to be checked to followup on patient's H&H. 2. Patient will need a referral to followup with a OB/GYN as outpatient for further evaluation and management of endometrial polyp seen on pelvic ultrasound 3. Patient will be called by the [facility] cardiology for an appointment time for outpatient stress test.  Discharge Diagnoses:  Principal Problem:  *Left sided numbness Active Problems:  Syncope  Anemia  Chest pain  HTN (hypertension)  Trichomonas  Hypokalemia   Discharge Condition: Stable and improved  Diet recommendation: Regular  Filed Weights   03/11/12 0211  Weight: 64.411 kg (142 lb)    History of present illness:  46 year old female with history of tobacco abuse presents with complaints of left-sided numbness and syncopal episodes. Patient has been having these episodes over the last 2 weeks. She has had at least 3 episodes during that period. Patient states that her body gets numb on the left side along the tongue and face. After about one minute she lose consciousness. She regains consciousness spontaneously within a minute. There was no associated tongue bite or incontinence of urine. Denies any chest pain shortness of breath or palpitations. Patient does not have any orthostatic symptoms. Since she had similar symptom yesterday she presented the ER. In the ER patient was initially found to have severe anemia. She does complain of heavy menstrual cycles for the last 6 months which she had discussed with her primary care physician was planning to refer her to a gynecologist. Patient denies any blood in the stools and has refused rectal exam. Patient however has been taking  NSAIDs for last 2-3 week because of increasing neck pain and headache. Patient has had a CT head which did not show any acute has been admitted for further management   Hospital Course:  #1 syncope with left-sided numbness  Unknown etiology. Patient was admitted with syncope and left-sided numbness and placed on a telemetry bed with a stroke workup which was done per admission head CT was negative for any acute infarct. MRI of the head and C-spine were negative for any cord compression or acute CVA. EEG which was done was negative for any epileptiform discharges. Likely secondary to patient's anemia. Patient is not orthostatic. Patient was transfused 2 units of packed red blood cells secondary to the anemia with clinical improvement or resolution of her symptoms. Patient remained in stable condition on the discharged home in stable and improved condition to followup with PCP.   #2 severe iron deficiency anemia  Patient was admitted and noted to be anemic with a hemoglobin of 6.9. Patient did state that she had very heavy severe menstrual cycles. Anemia panel which was done did show iron deficiency anemia with iron level of 14. Pelvic ultrasound was done which showed a 7 x 10 mm focal lesion in the endometrial cavity suspicious for endometrial polyp with normal appearance of both ovaries. No fibroids were seen. Patient was transfused 2 units of packed red blood cells with appropriate responses such that by day of discharge her hemoglobin was 9.2. Patient did not have any overt GI bleed. Patient remained in stable condition with discharge home on iron supplements. Patient is to followup with her GYN as outpatient for further evaluation and management.  #  3 Trichomonas in the urine  Urinalysis which was done on admission did show trichomonas and patient's urine. Patient was given a one-time dose of Flagyl 2000 mg by mouth x1.  #4 chest pain  During the hospitalization patient did complain of substernal chest  pain with some shortness of breath. Patient felt might be secondary to anxiety as improved as she calmed herself down. Cardiac enzymes were cycled which were negative x3. EKG which was done showed a normal sinus rhythm. 2-D echo was obtained which have a year for 50-55% with no wall motion abnormalities. Patient remained asymptomatic she'll be discharged home in stable and improved condition and will be called by [facility] cardiology to be scheduled for outpatient stress test. Patient will be discharged in stable and improved condition.  #5 tobacco abuse  Tobacco cessation.  #6 diarrhea  During the hospitalization patient did complain of some diarrhea. C. difficile PCR which was done was negative. Patient diarrhea resolved by day of discharge.      Procedures: 2-D echo 03/12/2012  MRI of the head and C-spine 03/11/2012  CT head 03/10/2012  Chest x-ray 03/11/2012  Trans-vaginal ultrasound 03/11/2012  Status post 2 units of packed red blood cells 03/10/2012     Consultations:  None  Discharge Exam: Filed Vitals:   03/12/12 1802 03/12/12 2242 03/13/12 0200 03/13/12 0600  BP: 121/69 125/68 128/70 120/63  Pulse: 66 55 49 53  Temp: 97.3 F (36.3 C) 98.3 F (36.8 C) 98.9 F (37.2 C) 98 F (36.7 C)  TempSrc: Oral Oral    Resp: 20 20 18 18   Height:      Weight:      SpO2: 100% 100% 100% 99%    General: NAD Cardiovascular: RRR Respiratory: CTAB  Discharge Instructions      Discharge Orders    Future Orders Please Complete By Expires   Diet general      Increase activity slowly      Discharge instructions      Comments:   Follow up with HILL,GERALD K, MD in 1 week Follow up with OBGYN as outpatient for further evaluation of fibroids [place] cardiology will call with appointment time for outpatient stress test.       Medication List     As of 03/13/2012 10:57 AM    TAKE these medications         ferrous sulfate 325 (65 FE) MG tablet   Take 1 tablet (325 mg  total) by mouth 3 (three) times daily with meals.         Follow-up Information    Follow up with [facility] K, MD. Schedule an appointment as soon as possible for a visit in 1 week.   Contact information:   [address] [place] [address] [phone]       Please follow up. (follow up with OBGYN as outpatient.)       Follow up with [facility] [address] (Cardiology will call you with appointment for outpatient stress test.)    Contact information:   [address] [place] [address]           The results of significant diagnostics from this hospitalization (including imaging, microbiology, ancillary and laboratory) are listed below for reference.    Significant Diagnostic Studies: Ct Head Wo Contrast  03/10/2012  *RADIOLOGY REPORT*  Clinical Data: Lightheadedness, left sided numbness  CT HEAD WITHOUT CONTRAST  Technique:  Contiguous axial images were obtained from the base of the  skull through the vertex without contrast.  Comparison: 09/22/2004  Findings: Early mineralization in bilateral basal ganglia. There is no evidence of acute intracranial hemorrhage, brain edema, mass lesion, acute infarction,   mass effect, or midline shift. Acute infarct may be inapparent on noncontrast CT.  No other intra-axial abnormalities are seen, and the ventricles and sulci are within normal limits in size and symmetry.   No abnormal extra-axial fluid collections or masses are identified.  No significant calvarial abnormality.  IMPRESSION: 1. Negative for bleed or other acute intracranial process.   Original Report Authenticated By: D. Andria Rhein, MD    Mri Brain Without Contrast  03/11/2012  *RADIOLOGY REPORT*  Clinical Data: 46 year old female with headache, left side numbness, hypertension, lightheadedness.  MRI HEAD WITHOUT CONTRAST  Technique:  Multiplanar, multiecho pulse sequences of the brain and surrounding structures were obtained according to standard  protocol without intravenous contrast.  Comparison: Head CTs 03/10/2012 and earlier.  Findings: Normal cerebral volume. No restricted diffusion to suggest acute infarction.  No midline shift, mass effect, evidence of mass lesion, ventriculomegaly, extra-axial collection or acute intracranial hemorrhage.  Cervicomedullary junction and pituitary are within normal limits.  Major intracranial vascular flow voids are preserved. Wallace Cullens and white matter signal is within normal limits throughout the brain.  Negative visualized cervical spine.  Visualized orbit soft tissues are within normal limits.  Minimal paranasal sinus mucosal thickening.  Mastoids are clear.  Negative scalp soft tissues. Visualized bone marrow signal is within normal limits.  IMPRESSION: Normal noncontrast MRI appearance of the brain.   Original Report Authenticated By: Erskine Speed, M.D.    Mr Cervical Spine Wo Contrast  03/11/2012  *RADIOLOGY REPORT*  Clinical Data: 46 year old female with neck pain.  Left side numbness.  The examination had to be discontinued prior to completion due to patient claustrophobia/inability to complete.  MRI CERVICAL SPINE WITHOUT CONTRAST  Technique:  Multiplanar and multiecho pulse sequences of the cervical spine, to include the craniocervical junction and cervicothoracic junction, were obtained according to standard protocol without intravenous contrast.  Comparison: Brain MRI from the same day reported separately. Cervical spine radiographs 04/13/2007.  Findings: Sagittal imaging only was obtained.  The sagittal T2- weighted imaging is degraded by motion.  Stable mild reversal of cervical lordosis. No marrow edema or evidence of acute osseous abnormality.  Visualized paraspinal soft tissues are within normal limits.  Cervicomedullary junction is within normal limits.  Probable prominent central spinal canal, most apparent at C6-C7. No abnormal decreased T1 signal in the visible spinal cord.  C2-C3:  Negative.  C3-C4:   Mild uncovertebral hypertrophy.  No definite stenosis.  C4-C5:  Anterior eccentric disc bulge.  No stenosis.  C5-C6:  Small central disc protrusion.  Narrowing of the ventral CSF space but no definite spinal stenosis.  No foraminal stenosis is evident.  C6-C7:  Small central disc protrusion narrowing ventral CSF space. No definite spinal stenosis.  Bilateral uncovertebral hypertrophy. Mild left greater than right C7 foraminal stenosis.  C7-T1:  Negative.  Visualized upper thoracic levels are negative.  IMPRESSION: 1. The examination had to be discontinued prior to completion such that only sagittal imaging was obtained.  2.  C5-C6 and C6-C7 disc degeneration.  Evidence of left C7 foraminal stenosis at the latter.  No definite spinal stenosis.   Original Report Authenticated By: Erskine Speed, M.D.    US Transvaginal Non-ob  03/11/2012  *RADIOLOGY REPORT*  Clinical Data: Menorrhagia.  LMP 02/27/2012.  TRANSABDOMINAL AND TRANSVAGINAL ULTRASOUND OF PELVIS  Technique:  Both transabdominal and transvaginal ultrasound examinations of the pelvis were performed.  Transabdominal technique was performed for global imaging of the pelvis including uterus, ovaries, adnexal regions, and pelvic cul-de-sac.  It was necessary to proceed with endovaginal exam following the transabdominal exam to visualize the endometrium and myometrium.  Comparison:  CT on 03/22/2007  Findings: Uterus:  11.3 x 6.7 x 6.9 cm.  Diffusely heterogeneous echotexture of the uterine myometrium is seen, however no distinct fibroids are identified.  Endometrium: Indistinct endometrial- myometrial junction.  Double layer endometrial thickness measures approximately 15 mm transvaginally.  A focal echogenic lesion is seen in the fundal portion of the endometrial cavity measuring approximately 7 x 10 mm, suspicious for endometrial polyp.  Right ovary: 2.1 x 1.3 x 1.9 cm.  Normal appearance.  Left ovary: 2.3 x 1.9 x 2.2 cm.  Normal appearance.  Other Findings:   Tiny amount of free fluid in pelvic cul-de-sac.  IMPRESSION:  1.  Heterogeneous uterine myometrium, without distinct fibroids. 2.  7 x 10 mm focal lesion in the endometrial cavity, suspicious for endometrial polyp.  Consider further evaluation with sonohysterogram for confirmation prior to hysteroscopy. Endometrial sampling should also be considered if patient is at high risk for endometrial carcinoma. (Ref:  Radiological Reasoning: Algorithmic Workup of Abnormal Vaginal Bleeding with Endovaginal Sonography and Sonohysterography. AJR 2008; 161:W96-04) 3.  Normal appearance of both ovaries.  No adnexal mass identified.   Original Report Authenticated By: Myles Rosenthal, M.D.    US Pelvis Complete  03/11/2012  *RADIOLOGY REPORT*  Clinical Data: Menorrhagia.  LMP 02/27/2012.  TRANSABDOMINAL AND TRANSVAGINAL ULTRASOUND OF PELVIS  Technique:  Both transabdominal and transvaginal ultrasound examinations of the pelvis were performed.  Transabdominal technique was performed for global imaging of the pelvis including uterus, ovaries, adnexal regions, and pelvic cul-de-sac.  It was necessary to proceed with endovaginal exam following the transabdominal exam to visualize the endometrium and myometrium.  Comparison:  CT on 03/22/2007  Findings: Uterus:  11.3 x 6.7 x 6.9 cm.  Diffusely heterogeneous echotexture of the uterine myometrium is seen, however no distinct fibroids are identified.  Endometrium: Indistinct endometrial- myometrial junction.  Double layer endometrial thickness measures approximately 15 mm transvaginally.  A focal echogenic lesion is seen in the fundal portion of the endometrial cavity measuring approximately 7 x 10 mm, suspicious for endometrial polyp.  Right ovary: 2.1 x 1.3 x 1.9 cm.  Normal appearance.  Left ovary: 2.3 x 1.9 x 2.2 cm.  Normal appearance.  Other Findings:  Tiny amount of free fluid in pelvic cul-de-sac.  IMPRESSION:  1.  Heterogeneous uterine myometrium, without distinct fibroids. 2.  7  x 10 mm focal lesion in the endometrial cavity, suspicious for endometrial polyp.  Consider further evaluation with sonohysterogram for confirmation prior to hysteroscopy. Endometrial sampling should also be considered if patient is at high risk for endometrial carcinoma. (Ref:  Radiological Reasoning: Algorithmic Workup of Abnormal Vaginal Bleeding with Endovaginal Sonography and Sonohysterography. AJR 2008; 540:J81-19) 3.  Normal appearance of both ovaries.  No adnexal mass identified.   Original Report Authenticated By: Myles Rosenthal, M.D.    Dg Chest Port 1 View  03/11/2012  *RADIOLOGY REPORT*  Clinical Data: Shortness of breath, chest pain, dizziness, question infiltrate  PORTABLE CHEST - 1 VIEW  Comparison: Portable exam 0139 hours compared to 07/15/2011  Findings: Upper-normal size of cardiac silhouette. Mediastinal contours and pulmonary vascularity normal. Lungs clear. No pleural effusion or pneumothorax. Osseous structures unremarkable.  IMPRESSION: No acute abnormalities.  Original Report Authenticated By: Ulyses Southward, M.D.     Microbiology: Recent Results (from the past 240 hour(s))  URINE CULTURE     Status: Normal   Collection Time   03/10/12  9:45 PM      Component Value Range Status Comment   Specimen Description URINE, RANDOM   Final    Special Requests NONE   Final    Culture  Setup Time 03/11/2012 06:51   Final    Colony Count >=100,000 COLONIES/ML   Final    Culture     Final    Value: Multiple bacterial morphotypes present, none predominant. Suggest appropriate recollection if clinically indicated.   Report Status 03/12/2012 FINAL   Final   CLOSTRIDIUM DIFFICILE BY PCR     Status: Normal   Collection Time   03/12/12  2:15 PM      Component Value Range Status Comment   C difficile by pcr NEGATIVE  NEGATIVE Final      Labs: Basic Metabolic Panel:  Lab 03/13/12 8295 03/12/12 0545 03/11/12 0624 03/10/12 1724  NA 138 138 139 140  K 3.4* 3.6 3.4* 3.0*  CL 105 105 106 103    CO2 25 26 24 27   GLUCOSE 88 91 112* 79  BUN 10 5* 5* 7  CREATININE 0.97 0.79 0.69 0.78  CALCIUM 8.7 8.7 8.2* 8.9  MG -- -- -- --  PHOS -- -- -- --   Liver Function Tests:  Lab 03/11/12 0624 03/10/12 1724  AST 19 17  ALT 11 10  ALKPHOS 61 70  BILITOT 0.7 0.8  PROT 6.1 6.9  ALBUMIN 3.3* 4.0   No results found for this basename: LIPASE:5,AMYLASE:5 in the last 168 hours No results found for this basename: AMMONIA:5 in the last 168 hours CBC:  Lab 03/13/12 0245 03/12/12 0545 03/11/12 0624 03/10/12 1724  WBC 9.1 8.8 8.3 10.7*  NEUTROABS -- -- 5.6 7.3  HGB 9.2* 9.4* 6.9* 7.5*  HCT 31.5* 32.0* 25.1* 27.0*  MCV 69.8* 69.6* 67.5* 66.3*  PLT 176 214 214 287   Cardiac Enzymes:  Lab 03/13/12 0306 03/12/12 2041 03/12/12 1528  CKTOTAL 81 93 93  CKMB 1.5 1.4 1.6  CKMBINDEX -- -- --  TROPONINI <0.30 <0.30 <0.30   BNP: BNP (last 3 results) No results found for this basename: PROBNP:3 in the last 8760 hours CBG:  Lab 03/11/12 0218 03/10/12 1637  GLUCAP 104* 80       Signed:  Ramla Hase  [facility] 03/13/2012, 10:57 AM

## 2012-03-13 NOTE — Progress Notes (Signed)
Pt given D/C instructions with Rx, verbal understanding given by Pt. Pt D/C'd home via wheelchair @ 1230 per MD order. Rema Fendt, RN

## 2012-03-26 ENCOUNTER — Emergency Department (INDEPENDENT_AMBULATORY_CARE_PROVIDER_SITE_OTHER)
Admission: EM | Admit: 2012-03-26 | Discharge: 2012-03-26 | Disposition: A | Discharge status: Home / Self Care | Payer: Self-pay | Source: Home / Self Care | Attending: Family Medicine | Admitting: Family Medicine

## 2012-03-26 DIAGNOSIS — N944 Primary dysmenorrhea: Secondary | ICD-10-CM

## 2012-03-26 DIAGNOSIS — N946 Dysmenorrhea, unspecified: Secondary | ICD-10-CM

## 2012-03-26 MED ORDER — KETOROLAC TROMETHAMINE 10 MG PO TABS
10.0000 mg | ORAL_TABLET | Freq: Four times a day (QID) | ORAL | Status: DC | PRN
Start: 2012-03-26 — End: 2013-03-11

## 2012-03-26 MED ORDER — KETOROLAC TROMETHAMINE 30 MG/ML IJ SOLN
INTRAMUSCULAR | Status: AC
Start: 2012-03-26 — End: 2012-03-26
  Filled 2012-03-26: qty 2

## 2012-03-26 MED ORDER — KETOROLAC TROMETHAMINE 60 MG/2ML IM SOLN
60.0000 mg | Freq: Once | INTRAMUSCULAR | Status: AC
  Administered 2012-03-26: 60 mg via INTRAMUSCULAR

## 2012-03-26 NOTE — ED Provider Notes (Signed)
History     CSN: [id number]  Arrival date & time 03/26/12  1954   First MD Initiated Contact with Patient 03/26/12 2104      No chief complaint on file.   (Consider location/radiation/quality/duration/timing/severity/associated sxs/prior treatment) Patient is a 46 y.o. female presenting with female genitourinary complaint. The history is provided by the patient.  Female GU Problem Primary symptoms include pelvic pain and vaginal bleeding. There has been no fever. This is a chronic problem. The current episode started 12 to 24 hours ago (onset this am with onset of menses, recent hosp and d/c plans for hyst on 12/10, can't take the pain.). The problem has been gradually worsening. The symptoms occur during menstruation. LMP: today. The patient's menstrual history has been irregular.    Past Medical History  Diagnosis Date  . Hypertension   . HTN (hypertension) 03/12/2012    Past Surgical History  Procedure Date  . Cholecystectomy     Family History  Problem Relation Age of Onset  . Breast cancer Maternal Aunt     History  Substance Use Topics  . Smoking status: Current Every Day Smoker  . Smokeless tobacco: Not on file  . Alcohol Use: No    OB History    Grav Para Term Preterm Abortions TAB SAB Ect Mult Living                  Review of Systems  Constitutional: Negative.   Gastrointestinal: Negative.   Genitourinary: Positive for vaginal bleeding, menstrual problem and pelvic pain. Negative for vaginal discharge and vaginal pain.  Musculoskeletal: Negative.     Allergies  Review of patient's allergies indicates no known allergies.  Home Medications   Current Outpatient Rx  Name  Route  Sig  Dispense  Refill  . FERROUS SULFATE 325 (65 FE) MG PO TABS   Oral   Take 1 tablet (325 mg total) by mouth 3 (three) times daily with meals.   90 tablet   0   . KETOROLAC TROMETHAMINE 10 MG PO TABS   Oral   Take 1 tablet (10 mg total) by mouth every 6 (six) hours  as needed for pain.   20 tablet   0     BP 137/73  Pulse 66  Temp 97.9 F (36.6 C) (Oral)  Resp 20  SpO2 100%  LMP 02/27/2012  Physical Exam  Nursing note and vitals reviewed. Constitutional: She is oriented to person, place, and time. She appears well-developed and well-nourished. She appears distressed.  Abdominal: Soft. Bowel sounds are normal. She exhibits no distension and no mass. There is tenderness in the suprapubic area. There is no rebound, no guarding and no CVA tenderness.  Neurological: She is alert and oriented to person, place, and time.  Skin: Skin is warm and dry.    ED Course  Procedures (including critical care time)  Labs Reviewed - No data to display No results found.   1. Primary dysmenorrhea       MDM          Linna Hoff, MD 03/26/12 2125

## 2012-06-11 ENCOUNTER — Emergency Department (HOSPITAL_COMMUNITY)
Admit: 2012-06-11 | Discharge: 2012-06-11 | Disposition: A | Discharge status: Home / Self Care | Payer: Self-pay | Attending: Family Medicine | Admitting: Family Medicine

## 2012-06-11 ENCOUNTER — Encounter (HOSPITAL_COMMUNITY): Payer: Self-pay | Admitting: Emergency Medicine

## 2012-06-11 ENCOUNTER — Emergency Department (INDEPENDENT_AMBULATORY_CARE_PROVIDER_SITE_OTHER)
Admission: EM | Admit: 2012-06-11 | Discharge: 2012-06-11 | Disposition: A | Discharge status: Home / Self Care | Payer: Self-pay | Source: Home / Self Care

## 2012-06-11 DIAGNOSIS — S9306XA Dislocation of unspecified ankle joint, initial encounter: Secondary | ICD-10-CM

## 2012-06-11 DIAGNOSIS — S82899A Other fracture of unspecified lower leg, initial encounter for closed fracture: Secondary | ICD-10-CM

## 2012-06-11 DIAGNOSIS — S9304XA Dislocation of right ankle joint, initial encounter: Secondary | ICD-10-CM

## 2012-06-11 DIAGNOSIS — S82301A Unspecified fracture of lower end of right tibia, initial encounter for closed fracture: Secondary | ICD-10-CM

## 2012-06-11 DIAGNOSIS — W19XXXA Unspecified fall, initial encounter: Secondary | ICD-10-CM | POA: Insufficient documentation

## 2012-06-11 MED ORDER — HYDROCODONE-ACETAMINOPHEN 5-325 MG PO TABS
ORAL_TABLET | ORAL | Status: AC
  Filled 2012-06-11: qty 1

## 2012-06-11 MED ORDER — HYDROCODONE-ACETAMINOPHEN 10-325 MG PO TABS: 1.0000 | ORAL_TABLET | Freq: Four times a day (QID) | ORAL | Status: DC | PRN

## 2012-06-11 MED ORDER — HYDROCODONE-ACETAMINOPHEN 5-325 MG PO TABS
1.0000 | ORAL_TABLET | Freq: Once | ORAL | Status: AC
  Administered 2012-06-11: 1 via ORAL

## 2012-06-11 MED ORDER — KETOROLAC TROMETHAMINE 60 MG/2ML IM SOLN: 60.0000 mg | Freq: Once | INTRAMUSCULAR | Status: DC

## 2012-06-11 MED ORDER — KETOROLAC TROMETHAMINE 60 MG/2ML IM SOLN
INTRAMUSCULAR | Status: AC
  Filled 2012-06-11: qty 2

## 2012-06-11 NOTE — ED Notes (Signed)
Patient casting procedure almost complete, patient not ready to leave

## 2012-06-11 NOTE — ED Notes (Signed)
Patient reports twisting ankle on black ice.  Incident occurred 3 days ago.  Has been using ice packs, heat packs.  Right ankle is swollen, bruising visible.  pedial pulses 2 plus.

## 2012-06-11 NOTE — ED Notes (Signed)
Spoke to ortho tech, aware of splint request.  Coming to cast patient

## 2012-06-11 NOTE — Progress Notes (Signed)
Orthopedic Tech Progress Note Patient Details:  Kimberly Jefferson [birth year] [id number]  Ortho Devices Type of Ortho Device: Stirrup splint;Post (short) splint Splint Material: Plaster Ortho Device/Splint Location: right leg Ortho Device/Splint Interventions: Application   [name] 06/11/2012, 3:24 PM

## 2012-06-11 NOTE — ED Notes (Signed)
Patient not ready to discharge

## 2012-06-11 NOTE — ED Notes (Signed)
Supplied ice pack

## 2012-06-11 NOTE — ED Notes (Signed)
Offered sprite to family

## 2012-06-11 NOTE — Progress Notes (Signed)
Orthopedic Tech Progress Note Patient Details:  [name] December 02, 1965 [id number]  Patient ID: Kimberly Jefferson, female   DOB: 1966/04/22, 47 y.o.   MRN: [id number] Viewed order from doctor's order list  Nikki Dom 06/11/2012, 3:25 PM

## 2012-06-11 NOTE — ED Notes (Signed)
Ortho tech in treatment room applying splint ?

## 2012-06-11 NOTE — ED Notes (Signed)
Crutches placed in RM with Pt

## 2012-06-11 NOTE — ED Notes (Signed)
Patient transferred to [facility] for xray.  Transferred by shuttle/spouse with patient

## 2012-06-11 NOTE — ED Notes (Signed)
Patient remains out of department

## 2012-06-11 NOTE — ED Notes (Signed)
Family at bedside.  Offered refreshments to patient's spouse, declined

## 2012-06-11 NOTE — ED Provider Notes (Signed)
Medical screening examination/treatment/procedure(s) were performed by non-physician practitioner and as supervising physician I was immediately available for consultation/collaboration.  Sura Canul   Lucah Petta, MD 06/11/12 1548 

## 2012-06-11 NOTE — ED Notes (Signed)
Patient returned from radiology at [facility].

## 2012-06-11 NOTE — ED Provider Notes (Signed)
History     CSN: [id number]  Arrival date & time 06/11/12  1049   First MD Initiated Contact with Patient 06/11/12 1145      Chief Complaint  Patient presents with  . Ankle Pain    (Consider location/radiation/quality/duration/timing/severity/associated sxs/prior treatment) HPI Comments: 47 year old female fell 3 days ago on the ice and injured her right ankle. She states that she twisted in some way and experienced severe acute pain at the time. She has been keeping it elevated and has not been bearing weight. She has also been applying ice. She states the swelling has improved only modestly. She is unable to bear weight on the right lower extremity. She denies injury to the foot, knee, hip, back or other areas.   Past Medical History  Diagnosis Date  . Hypertension   . HTN (hypertension) 03/12/2012    Past Surgical History  Procedure Laterality Date  . Cholecystectomy      Family History  Problem Relation Age of Onset  . Breast cancer Maternal Aunt     History  Substance Use Topics  . Smoking status: Current Every Day Smoker  . Smokeless tobacco: Not on file  . Alcohol Use: No    OB History   Grav Para Term Preterm Abortions TAB SAB Ect Mult Living                  Review of Systems  Constitutional: Negative for fever, chills and activity change.  HENT: Negative.   Respiratory: Negative.   Gastrointestinal: Negative.   Musculoskeletal: Positive for joint swelling and gait problem. Negative for back pain.       As per HPI  Skin: Negative for color change, pallor and rash.  Neurological: Negative.   Psychiatric/Behavioral: Negative.     Allergies  Review of patient's allergies indicates no known allergies.  Home Medications   Current Outpatient Rx  Name  Route  Sig  Dispense  Refill  . ferrous sulfate 325 (65 FE) MG tablet   Oral   Take 1 tablet (325 mg total) by mouth 3 (three) times daily with meals.   90 tablet   0   .  HYDROcodone-acetaminophen (NORCO) 10-325 MG per tablet   Oral   Take 1 tablet by mouth every 6 (six) hours as needed for pain.   30 tablet   0   . ketorolac (TORADOL) 10 MG tablet   Oral   Take 1 tablet (10 mg total) by mouth every 6 (six) hours as needed for pain.   20 tablet   0     BP 119/70  Pulse 73  Temp(Src) 98.3 F (36.8 C) (Oral)  Resp 18  SpO2 99%  LMP 05/30/2012  Physical Exam  Constitutional: She is oriented to person, place, and time. She appears well-developed and well-nourished. No distress.  HENT:  Head: Normocephalic and atraumatic.  Eyes: EOM are normal. Pupils are equal, round, and reactive to light.  Neck: Normal range of motion. Neck supple.  Cardiovascular: Normal rate.   Pulmonary/Chest: Effort normal. No respiratory distress.  Musculoskeletal: She exhibits edema and tenderness.  Tenderness and moderate swelling involving the entire ankle and extending superiorly into the lower most aspect of the tib-fib. Swelling has extended into the foot although there is no tenderness. Distal sensory and neurovascular is intact. She is able to wiggle her toes and provide range of motion with dorsiflexion, plantar flexion, inversion, eversion but to a limited range.  Neurological: She is alert and oriented to  person, place, and time. No cranial nerve deficit.  Skin: Skin is warm and dry.  Psychiatric: She has a normal mood and affect.    ED Course  Procedures (including critical care time)  Labs Reviewed - No data to display Dg Ankle Complete Right  06/11/2012  *RADIOLOGY REPORT*  Clinical Data: Fall, ankle injury, swelling.  RIGHT ANKLE - COMPLETE 3+ VIEW  Comparison: None  Findings: Diffuse soft tissue swelling.  There is a minimally displaced oblique fracture through the distal right fibula.  No tibial fracture noted.  Question widening of the medial ankle mortise.  IMPRESSION: Oblique minimally-displaced distal fibular fracture.  Question widening of the medial  ankle mortise.   Original Report Authenticated By: Charlett Nose, M.D.      1. Closed fracture of distal end of fibula with tibia, right, initial encounter   2. Ankle dislocation, right, initial encounter       MDM  Initially Toradol 60 mg was ordered and the patient agreed to take however at the time just before administration she declined to have the medicine IM. She is requesting a "pill" He Toradol is discontinued and Norco 5 mg one tablet is ordered for pain. Another ordered post x ray X-ray machine is down at this time so she will be taken to the emergency department for the x-ray.  Dr. Ranell Patrick is the orthopedist was contacted and we discussed the treatment plan. Is requesting the application of a posterior and stirrup splint applied to the ankle/lower extremity. She is to maintain elevation, no weightbearing and use of crutches. She is to call his office today to obtain an appointment for Friday morning. For pain, Norco 10/325 one every 4-6 hours when necessary pain #30        Hayden Rasmussen, NP 06/11/12 1430

## 2012-06-16 ENCOUNTER — Encounter (HOSPITAL_COMMUNITY): Payer: Self-pay | Admitting: Pharmacy Technician

## 2012-06-17 ENCOUNTER — Encounter (HOSPITAL_COMMUNITY): Payer: Self-pay | Admitting: *Deleted

## 2012-06-17 MED ORDER — CEFAZOLIN SODIUM-DEXTROSE 2-3 GM-% IV SOLR
2.0000 g | INTRAVENOUS | Status: AC
  Administered 2012-06-18: 2 g via INTRAVENOUS
  Filled 2012-06-17: qty 50

## 2012-06-17 NOTE — H&P (Signed)
  Kimberly Jefferson is an 47 y.o. female.    Chief Complaint: right ankle pain s/p injury  HPI: Pt is a 47 y.o. female complaining of right ankle pain s/p fall and diagnosis of fibula fracture.  Risks, benefits and expectations were discussed with the patient. Patient understand the risks, benefits and expectations and wishes to proceed with surgery.   PCP:  Evlyn Courier, MD  D/C Plans: home  PMH: Past Medical History  Diagnosis Date  . Heart murmur     "nothing to worry about"  . Hypertension     one time  . HTN (hypertension) 03/12/2012  . Pneumonia     "had it once a year for a few years"  last time was 3- 4 years ago.  Marland Kitchen History of blood transfusion     heavy periods  . Dysmenorrhea     PSH: Past Surgical History  Procedure Laterality Date  . Cholecystectomy      Social History:  reports that she has been smoking.  She does not have any smokeless tobacco history on file. She reports that she does not drink alcohol or use illicit drugs.  Allergies:  No Known Allergies  Medications: No current facility-administered medications for this encounter.   Current Outpatient Prescriptions  Medication Sig Dispense Refill  . ferrous sulfate 325 (65 FE) MG tablet Take 1 tablet (325 mg total) by mouth 3 (three) times daily with meals.  90 tablet  0  . HYDROcodone-acetaminophen (NORCO) 10-325 MG per tablet Take 1 tablet by mouth every 6 (six) hours as needed for pain.  30 tablet  0  . ketorolac (TORADOL) 10 MG tablet Take 1 tablet (10 mg total) by mouth every 6 (six) hours as needed for pain.  20 tablet  0    No results found for this or any previous visit (from the past 48 hour(s)). No results found.  ROS: Pain in the right ankle Inability to bear weight on right ankle  Physical Exam: Alert and oriented 47 y.o. female in no acute distress Cranial nerves 2-12 intact Cervical spine: full rom with no tenderness, nv intact distally Chest: active breath sounds bilaterally, no  wheeze rhonchi or rales Heart: regular rate and rhythm, no murmur Abd: non tender non distended with active bowel sounds Hip is stable with rom  Right ankle currently in splint, nv intact distally Minimal edema No sign of open injury  Assessment/Plan Assessment: right ankle fracture  Plan: Patient will undergo a right ankle ORIF by Dr. Ranell Patrick at [facility]. Risks benefits and expectations were discussed with the patient. Patient understand risks, benefits and expectations and wishes to proceed.

## 2012-06-18 ENCOUNTER — Ambulatory Visit (HOSPITAL_COMMUNITY): Payer: Self-pay

## 2012-06-18 ENCOUNTER — Encounter (HOSPITAL_COMMUNITY): Payer: Self-pay | Admitting: *Deleted

## 2012-06-18 ENCOUNTER — Ambulatory Visit (HOSPITAL_COMMUNITY): Payer: Self-pay | Admitting: Certified Registered Nurse Anesthetist

## 2012-06-18 ENCOUNTER — Encounter (HOSPITAL_COMMUNITY): Payer: Self-pay | Admitting: Certified Registered Nurse Anesthetist

## 2012-06-18 ENCOUNTER — Encounter (HOSPITAL_COMMUNITY)
Admission: RE | Disposition: A | Discharge status: Home / Self Care | Payer: Self-pay | Source: Ambulatory Visit | Attending: Orthopedic Surgery

## 2012-06-18 ENCOUNTER — Ambulatory Visit (HOSPITAL_COMMUNITY)
Admission: RE | Admit: 2012-06-18 | Discharge: 2012-06-18 | Disposition: A | Discharge status: Home / Self Care | Payer: Self-pay | Source: Ambulatory Visit | Attending: Orthopedic Surgery | Admitting: Orthopedic Surgery

## 2012-06-18 DIAGNOSIS — I10 Essential (primary) hypertension: Secondary | ICD-10-CM | POA: Insufficient documentation

## 2012-06-18 DIAGNOSIS — X500XXA Overexertion from strenuous movement or load, initial encounter: Secondary | ICD-10-CM | POA: Insufficient documentation

## 2012-06-18 DIAGNOSIS — S82891A Other fracture of right lower leg, initial encounter for closed fracture: Secondary | ICD-10-CM

## 2012-06-18 DIAGNOSIS — S82899A Other fracture of unspecified lower leg, initial encounter for closed fracture: Secondary | ICD-10-CM | POA: Insufficient documentation

## 2012-06-18 DIAGNOSIS — N946 Dysmenorrhea, unspecified: Secondary | ICD-10-CM | POA: Insufficient documentation

## 2012-06-18 DIAGNOSIS — Z79899 Other long term (current) drug therapy: Secondary | ICD-10-CM | POA: Insufficient documentation

## 2012-06-18 HISTORY — DX: Pneumonia, unspecified organism: J18.9

## 2012-06-18 HISTORY — PX: ORIF ANKLE FRACTURE: SHX5408

## 2012-06-18 HISTORY — DX: Personal history of other medical treatment: Z92.89

## 2012-06-18 HISTORY — DX: Cardiac murmur, unspecified: R01.1

## 2012-06-18 HISTORY — DX: Dysmenorrhea, unspecified: N94.6

## 2012-06-18 LAB — CBC
Hemoglobin: 12 g/dL (ref 12.0–15.0)
MCH: 26.5 pg (ref 26.0–34.0)
MCHC: 33.1 g/dL (ref 30.0–36.0)

## 2012-06-18 LAB — BASIC METABOLIC PANEL
BUN: 9 mg/dL (ref 6–23)
GFR calc non Af Amer: >90 mL/min (ref >90)
Glucose, Bld: 86 mg/dL (ref 70–99)
Potassium: 3.7 mEq/L (ref 3.5–5.1)

## 2012-06-18 LAB — SURGICAL PCR SCREEN: MRSA, PCR: NEGATIVE

## 2012-06-18 SURGERY — OPEN REDUCTION INTERNAL FIXATION (ORIF) ANKLE FRACTURE
Anesthesia: General | Site: Ankle | Laterality: Right | Wound class: Clean

## 2012-06-18 MED ORDER — OXYCODONE-ACETAMINOPHEN 5-325 MG PO TABS: 1.0000 | ORAL_TABLET | ORAL | Status: DC | PRN

## 2012-06-18 MED ORDER — CHLORHEXIDINE GLUCONATE 4 % EX LIQD: 60.0000 mL | Freq: Once | CUTANEOUS | Status: DC

## 2012-06-18 MED ORDER — BUPIVACAINE-EPINEPHRINE PF 0.5-1:200000 % IJ SOLN
INTRAMUSCULAR | Status: DC | PRN
  Administered 2012-06-18: 30 mL

## 2012-06-18 MED ORDER — HYDROMORPHONE HCL PF 1 MG/ML IJ SOLN: 0.2500 mg | INTRAMUSCULAR | Status: DC | PRN

## 2012-06-18 MED ORDER — FENTANYL CITRATE 0.05 MG/ML IJ SOLN
INTRAMUSCULAR | Status: DC | PRN
  Administered 2012-06-18 (×2): 100 ug via INTRAVENOUS
  Administered 2012-06-18: 50 ug via INTRAVENOUS

## 2012-06-18 MED ORDER — METHOCARBAMOL 500 MG PO TABS: 500.0000 mg | ORAL_TABLET | Freq: Three times a day (TID) | ORAL | Status: DC | PRN

## 2012-06-18 MED ORDER — DEXAMETHASONE SODIUM PHOSPHATE 4 MG/ML IJ SOLN
INTRAMUSCULAR | Status: DC | PRN
  Administered 2012-06-18: 4 mg via INTRAVENOUS

## 2012-06-18 MED ORDER — LACTATED RINGERS IV SOLN
INTRAVENOUS | Status: DC | PRN
  Administered 2012-06-18 (×2): via INTRAVENOUS

## 2012-06-18 MED ORDER — MUPIROCIN 2 % EX OINT
TOPICAL_OINTMENT | CUTANEOUS | Status: AC
  Administered 2012-06-18: 1 via NASAL
  Filled 2012-06-18: qty 22

## 2012-06-18 MED ORDER — MIDAZOLAM HCL 5 MG/5ML IJ SOLN
INTRAMUSCULAR | Status: DC | PRN
  Administered 2012-06-18: 2 mg via INTRAVENOUS

## 2012-06-18 MED ORDER — HYDROMORPHONE HCL PF 1 MG/ML IJ SOLN
INTRAMUSCULAR | Status: DC | PRN
  Administered 2012-06-18: 0.5 mg via INTRAVENOUS

## 2012-06-18 MED ORDER — PROPOFOL 10 MG/ML IV BOLUS
INTRAVENOUS | Status: DC | PRN
Start: 2012-06-18 — End: 2012-06-18
  Administered 2012-06-18: 130 mg via INTRAVENOUS

## 2012-06-18 MED ORDER — ROPIVACAINE HCL 5 MG/ML IJ SOLN
INTRAMUSCULAR | Status: DC | PRN
  Administered 2012-06-18: 10 mL via EPIDURAL

## 2012-06-18 MED ORDER — SUCCINYLCHOLINE CHLORIDE 20 MG/ML IJ SOLN
INTRAMUSCULAR | Status: DC | PRN
  Administered 2012-06-18: 100 mg via INTRAVENOUS

## 2012-06-18 MED ORDER — MUPIROCIN 2 % EX OINT
TOPICAL_OINTMENT | Freq: Two times a day (BID) | CUTANEOUS | Status: DC
  Filled 2012-06-18: qty 22

## 2012-06-18 MED ORDER — ONDANSETRON HCL 4 MG/2ML IJ SOLN
INTRAMUSCULAR | Status: DC | PRN
  Administered 2012-06-18: 4 mg via INTRAVENOUS

## 2012-06-18 MED ORDER — LACTATED RINGERS IV SOLN
INTRAVENOUS | Status: DC
  Administered 2012-06-18: 14:00:00 via INTRAVENOUS

## 2012-06-18 SURGICAL SUPPLY — 58 items
BANDAGE ELASTIC 4 VELCRO ST LF (GAUZE/BANDAGES/DRESSINGS) ×2 IMPLANT
BANDAGE ELASTIC 6 VELCRO ST LF (GAUZE/BANDAGES/DRESSINGS) ×2 IMPLANT
BANDAGE ESMARK 6X9 LF (GAUZE/BANDAGES/DRESSINGS) ×1 IMPLANT
BANDAGE GAUZE ELAST BULKY 4 IN (GAUZE/BANDAGES/DRESSINGS) ×2 IMPLANT
BIT DRILL 2.5X2.75 QC CALB (BIT) ×1 IMPLANT
BIT DRILL 3.5X5.5 QC CALB (BIT) ×1 IMPLANT
BIT DRILL CALIBRATED 2.7 (BIT) ×1 IMPLANT
BNDG CMPR 9X6 STRL LF SNTH (GAUZE/BANDAGES/DRESSINGS) ×1
BNDG ESMARK 6X9 LF (GAUZE/BANDAGES/DRESSINGS) ×2
CLOTH BEACON ORANGE TIMEOUT ST (SAFETY) ×2 IMPLANT
CUFF TOURNIQUET SINGLE 34IN LL (TOURNIQUET CUFF) ×1 IMPLANT
CUFF TOURNIQUET SINGLE 44IN (TOURNIQUET CUFF) IMPLANT
DECANTER SPIKE VIAL GLASS SM (MISCELLANEOUS) ×2 IMPLANT
DRAPE OEC MINIVIEW 54X84 (DRAPES) ×1 IMPLANT
DRAPE U-SHAPE 47X51 STRL (DRAPES) ×2 IMPLANT
DRSG ADAPTIC 3X8 NADH LF (GAUZE/BANDAGES/DRESSINGS) ×2 IMPLANT
DRSG PAD ABDOMINAL 8X10 ST (GAUZE/BANDAGES/DRESSINGS) ×2 IMPLANT
DURAPREP 26ML APPLICATOR (WOUND CARE) ×2 IMPLANT
ELECT REM PT RETURN 9FT ADLT (ELECTROSURGICAL) ×2
ELECTRODE REM PT RTRN 9FT ADLT (ELECTROSURGICAL) ×1 IMPLANT
FACESHIELD LNG OPTICON STERILE (SAFETY) ×2 IMPLANT
GAUZE SPONGE 4X4 16PLY XRAY LF (GAUZE/BANDAGES/DRESSINGS) ×1 IMPLANT
GLOVE BIOGEL PI ORTHO PRO 7.5 (GLOVE) ×1
GLOVE BIOGEL PI ORTHO PRO SZ8 (GLOVE) ×1
GLOVE ORTHO TXT STRL SZ7.5 (GLOVE) ×2 IMPLANT
GLOVE PI ORTHO PRO STRL 7.5 (GLOVE) ×1 IMPLANT
GLOVE PI ORTHO PRO STRL SZ8 (GLOVE) ×1 IMPLANT
GLOVE SURG ORTHO 8.5 STRL (GLOVE) ×2 IMPLANT
GOWN STRL NON-REIN LRG LVL3 (GOWN DISPOSABLE) ×6 IMPLANT
GOWN STRL REIN XL XLG (GOWN DISPOSABLE) ×4 IMPLANT
KIT BASIN OR (CUSTOM PROCEDURE TRAY) ×2 IMPLANT
KIT ROOM TURNOVER OR (KITS) ×2 IMPLANT
MANIFOLD NEPTUNE II (INSTRUMENTS) ×2 IMPLANT
NS IRRIG 1000ML POUR BTL (IV SOLUTION) ×2 IMPLANT
PACK ORTHO EXTREMITY (CUSTOM PROCEDURE TRAY) ×2 IMPLANT
PAD ARMBOARD 7.5X6 YLW CONV (MISCELLANEOUS) ×4 IMPLANT
PAD CAST 4YDX4 CTTN HI CHSV (CAST SUPPLIES) ×2 IMPLANT
PADDING CAST COTTON 4X4 STRL (CAST SUPPLIES) ×4
PLATE LOCK 3H 95 RT DIST FIB (Plate) ×1 IMPLANT
SCREW CORTICAL 3.5MM 22MM (Screw) ×1 IMPLANT
SCREW LOCK CORT STAR 3.5X10 (Screw) ×1 IMPLANT
SCREW LOCK CORT STAR 3.5X12 (Screw) ×1 IMPLANT
SCREW LOCK CORT STAR 3.5X14 (Screw) ×3 IMPLANT
SCREW NON LOCKING LP 3.5 14MM (Screw) ×3 IMPLANT
SCREW NON LOCKING LP 3.5 16MM (Screw) ×1 IMPLANT
SPONGE GAUZE 4X4 12PLY (GAUZE/BANDAGES/DRESSINGS) ×2 IMPLANT
SPONGE LAP 4X18 X RAY DECT (DISPOSABLE) ×4 IMPLANT
STAPLER VISISTAT 35W (STAPLE) ×2 IMPLANT
SUCTION FRAZIER TIP 10 FR DISP (SUCTIONS) ×2 IMPLANT
SUT ETHILON 3 0 FSL (SUTURE) ×4 IMPLANT
SUT VIC AB 2-0 CT1 27 (SUTURE) ×4
SUT VIC AB 2-0 CT1 TAPERPNT 27 (SUTURE) ×2 IMPLANT
SUT VIC AB 3-0 CT1 27 (SUTURE) ×2
SUT VIC AB 3-0 CT1 TAPERPNT 27 (SUTURE) ×1 IMPLANT
TOWEL OR 17X24 6PK STRL BLUE (TOWEL DISPOSABLE) ×2 IMPLANT
TOWEL OR 17X26 10 PK STRL BLUE (TOWEL DISPOSABLE) ×2 IMPLANT
TUBE CONNECTING 12X1/4 (SUCTIONS) ×2 IMPLANT
WATER STERILE IRR 1000ML POUR (IV SOLUTION) ×2 IMPLANT

## 2012-06-18 NOTE — Anesthesia Preprocedure Evaluation (Addendum)
Anesthesia Evaluation  Patient identified by MRN, date of birth, ID band Patient awake    Reviewed: Allergy & Precautions, H&P , NPO status , Patient's Chart, lab work & pertinent test results  History of Anesthesia Complications (+) PONV  Airway Mallampati: II TM Distance: >3 FB Neck ROM: Full    Dental  (+) Dental Advisory Given and Partial Upper   Pulmonary pneumonia -, Current Smoker,  breath sounds clear to auscultation        Cardiovascular hypertension, Rhythm:Regular Rate:Normal     Neuro/Psych    GI/Hepatic negative GI ROS, Neg liver ROS,   Endo/Other  negative endocrine ROS  Renal/GU negative Renal ROS     Musculoskeletal   Abdominal   Peds  Hematology negative hematology ROS (+)   Anesthesia Other Findings   Reproductive/Obstetrics                         Anesthesia Physical Anesthesia Plan  ASA: III  Anesthesia Plan: General   Post-op Pain Management:    Induction: Intravenous  Airway Management Planned: Oral ETT  Additional Equipment:   Intra-op Plan:   Post-operative Plan: Extubation in OR  Informed Consent: I have reviewed the patients History and Physical, chart, labs and discussed the procedure including the risks, benefits and alternatives for the proposed anesthesia with the patient or authorized representative who has indicated his/her understanding and acceptance.   Dental advisory given  Plan Discussed with: CRNA, Surgeon and Anesthesiologist  Anesthesia Plan Comments:         Anesthesia Quick Evaluation

## 2012-06-18 NOTE — Preoperative (Signed)
Beta Blockers   Reason not to administer Beta Blockers:Not Applicable 

## 2012-06-18 NOTE — Transfer of Care (Signed)
Immediate Anesthesia Transfer of Care Note  Patient: Kimberly Jefferson  Procedure(s) Performed: Procedure(s): OPEN REDUCTION INTERNAL FIXATION (ORIF) ANKLE FRACTURE (Right)  Patient Location: PACU  Anesthesia Type:General  Level of Consciousness: awake and alert   Airway & Oxygen Therapy: Patient Spontanous Breathing and Patient connected to nasal cannula oxygen  Post-op Assessment: Report given to PACU RN, Post -op Vital signs reviewed and stable and Patient moving all extremities  Post vital signs: Reviewed and stable  Complications: No apparent anesthesia complications

## 2012-06-18 NOTE — Anesthesia Postprocedure Evaluation (Signed)
  Anesthesia Post-op Note  Patient: Kimberly Jefferson  Procedure(s) Performed: Procedure(s): OPEN REDUCTION INTERNAL FIXATION (ORIF) ANKLE FRACTURE (Right)  Patient Location: PACU  Anesthesia Type:General  Level of Consciousness: awake  Airway and Oxygen Therapy: Patient Spontanous Breathing  Post-op Pain: mild  Post-op Assessment: Post-op Vital signs reviewed  Post-op Vital Signs: Reviewed  Complications: No apparent anesthesia complications

## 2012-06-18 NOTE — Interval H&P Note (Signed)
History and Physical Interval Note:  06/18/2012 2:32 PM  Kimberly Jefferson  has presented today for surgery, with the diagnosis of RIGHT ANKLE FRACTURE  The various methods of treatment have been discussed with the patient and family. After consideration of risks, benefits and other options for treatment, the patient has consented to  Procedure(s): OPEN REDUCTION INTERNAL FIXATION (ORIF) ANKLE FRACTURE (Right) as a surgical intervention .  The patient's history has been reviewed, patient examined, no change in status, stable for surgery.  I have reviewed the patient's chart and labs.  Questions were answered to the patient's satisfaction.     Janaisa Birkland,STEVEN R

## 2012-06-18 NOTE — Brief Op Note (Signed)
06/18/2012  4:31 PM  PATIENT:  Kimberly Jefferson  47 y.o. female  PRE-OPERATIVE DIAGNOSIS:  RIGHT ANKLE FRACTURE, displaced  POST-OPERATIVE DIAGNOSIS:  RIGHT ANKLE FRACTURE, displaced with interposed deltoid ligament  PROCEDURE:  Procedure(s): OPEN REDUCTION INTERNAL FIXATION (ORIF) ANKLE FRACTURE (Right), removal of deltoid ligament from joint medially  SURGEON:  Surgeon(s) and Role:    * Verlee Rossetti, MD - Primary  PHYSICIAN ASSISTANT:   ASSISTANTS: Clovis Pu, OST   ANESTHESIA:   regional and general  EBL:  Total I/O In: 1100 [I.V.:1100] Out: -   BLOOD ADMINISTERED:none  DRAINS: none   LOCAL MEDICATIONS USED:  NONE  SPECIMEN:  No Specimen  DISPOSITION OF SPECIMEN:  N/A  COUNTS:  YES  TOURNIQUET:   Total Tourniquet Time Documented: area (laterality) - 54 minutes Total: area (laterality) - 54 minutes   DICTATION: .Other Dictation: Dictation Number [id number]  PLAN OF CARE: Discharge to home after PACU  PATIENT DISPOSITION:  PACU - hemodynamically stable.   Delay start of Pharmacological VTE agent (>24hrs) due to surgical blood loss or risk of bleeding: not applicable

## 2012-06-18 NOTE — Anesthesia Procedure Notes (Addendum)
Anesthesia Regional Block:  Popliteal block  Pre-Anesthetic Checklist: ,, timeout performed, Correct Patient, Correct Site, Correct Laterality, Correct Procedure, Correct Position, site marked, Risks and benefits discussed, pre-op evaluation, post-op pain management  Laterality: Right  Prep: Maximum Sterile Barrier Precautions used and chloraprep       Needles:  Injection technique: Single-shot  Needle Type: Echogenic Stimulator Needle          Additional Needles:  Procedures: ultrasound guided (picture in chart) and nerve stimulator Popliteal block  Nerve Stimulator or Paresthesia:  Response: Peroneal,  Response: Tibial,   Additional Responses:   Narrative:  Start time: 06/18/2012 2:45 PM End time: 06/18/2012 2:58 PM Injection made incrementally with aspirations every 5 mL. Anesthesiologist: Sampson Goon, MD  Additional Notes: 2% Lidocaine skin wheel. Saphenous block with 10cc of 0.5% Ropivicaine plain.  Popliteal block Procedure Name: Intubation Date/Time: 06/18/2012 3:10 PM Performed by: Orvilla Fus A Pre-anesthesia Checklist: Patient identified, Timeout performed, Emergency Drugs available, Suction available and Patient being monitored Patient Re-evaluated:Patient Re-evaluated prior to inductionOxygen Delivery Method: Circle system utilized Preoxygenation: Pre-oxygenation with 100% oxygen Intubation Type: IV induction Ventilation: Mask ventilation without difficulty Laryngoscope Size: Mac and 3 Grade View: Grade I Tube type: Oral Number of attempts: 1 Airway Equipment and Method: Stylet Placement Confirmation: ETT inserted through vocal cords under direct vision,  breath sounds checked- equal and bilateral and positive ETCO2 Secured at: 20 cm Tube secured with: Tape Dental Injury: Teeth and Oropharynx as per pre-operative assessment

## 2012-06-18 NOTE — Interval H&P Note (Signed)
History and Physical Interval Note:  06/18/2012 2:32 PM  Kimberly Jefferson  has presented today for surgery, with the diagnosis of RIGHT ANKLE FRACTURE  The various methods of treatment have been discussed with the patient and family. After consideration of risks, benefits and other options for treatment, the patient has consented to  Procedure(s): OPEN REDUCTION INTERNAL FIXATION (ORIF) ANKLE FRACTURE (Right) as a surgical intervention .  The patient's history has been reviewed, patient examined, no change in status, stable for surgery.  I have reviewed the patient's chart and labs.  Questions were answered to the patient's satisfaction.     Jobe Mutch,STEVEN R   

## 2012-06-19 ENCOUNTER — Encounter (HOSPITAL_COMMUNITY): Payer: Self-pay | Admitting: Orthopedic Surgery

## 2012-06-19 NOTE — Op Note (Signed)
NAMEALEXZA, NORBECK NO.:  [id number]  MEDICAL RECORD NO.:  [id number]  LOCATION:  [facility]                         FACILITY:  [facility]  PHYSICIAN:  [name]. Ranell Patrick, M.D. DATE OF BIRTH:  23-Jul-1965  DATE OF PROCEDURE:  06/18/2012 DATE OF DISCHARGE:  06/18/2012                              OPERATIVE REPORT   PREOPERATIVE DIAGNOSIS:  Displaced right ankle fracture with displaced mortise.  POSTOPERATIVE DIAGNOSIS:  Displaced right ankle fracture with displaced mortise with interposed deltoid ligament medially.  PROCEDURE PERFORMED:  Open reduction and internal fixation of right ankle unstable ankle fracture with interposed deltoid ligament with removal of deltoid ligament from ankle mortise medially.  ATTENDING SURGEON:  [name]. Ranell Patrick, MD  ASSISTANT:  Eston Esters, scrub tech.  ANESTHESIA:  The popliteal block anesthesia plus general anesthesia was used.  ESTIMATED BLOOD LOSS:  Minimal.  FLUID REPLACEMENT: 1200 mL of crystalloids.  INSTRUMENT COUNTS:  Correct.  COMPLICATIONS:  There were no complications.  TOURNIQUET TIME:  45 minutes at 350 mmHg.  INDICATIONS:  The patient is a 47 year old female with a history of a fall, injuring her right ankle.  The patient twisted her ankle and had a displaced ankle fracture with a widened mortise.  She presented to the [facility], was placed in a short-leg splint, elevated, and she is brought back to the operating room theater with decreased swelling, ready for surgery to restore the ankle mortise instability to her ankle joint.  Informed consent was obtained.  DESCRIPTION OF PROCEDURE:  After adequate level of anesthesia was achieved, the patient was positioned supine on the operating room table. Radiolucent table was utilized.  A mini C-arm was used.  Nonsterile tourniquet was placed on right proximal thigh.  The right leg was sterilely prepped and draped in usual manner.  Time-out called.   Leg elevated and exsanguinated using Esmarch bandage.  Tourniquet was elevated to 350 mmHg.  We did a first attempted reduction under anesthesia and could not get the medial clear space and closed down on the ankle mortise.  Despite of reduction maneuver that is when I had made a small incision overlying the anterior medial ankle mortise.  This is a curvilinear incision, and a 10 blade just through skin Metzenbaum scissors down through the subcutaneous tissues careful to avoid injury to the saphenous vein.  We identified the joint.  The deltoid ligament was flipped in side.  We flipped that out using a Therapist, nutritional.  We thoroughly irrigated the anteromedial joint area and then once we did that, the ankle mortise reduced easily.  We went ahead then and made a longitudinal skin incision over the distal fibula.  We identified the fracture site.  We removed some soft tissue from the fracture site, irrigated the anterolateral ankle joint.  We reduced the fracture anatomically with a towel clip.  We then went ahead and placed our lag screw anterior-posterior, proximal to distal with AO technique, this is a 22 mm 3.5 screw.  We then were able to use an anatomic plate from the [employer] trauma set for the distal fibula with 5 locking screws distally and 3 nonlocked compression screws proximally with excellent stability to the  fracture.  We thoroughly irrigated.  We were careful to protect the peroneal tendons posteriorly.  We then repaired the subcutaneous tissues with 2-0 Vicryl followed by staples for the both incisions.  A sterile compressive bandage and short-leg splint were applied.  The patient tolerated the procedure well.     [name]. Ranell Patrick, M.D.     SRN/MEDQ  D:  06/18/2012  T:  06/19/2012  Job:  [id number]

## 2012-06-20 ENCOUNTER — Encounter (HOSPITAL_COMMUNITY): Payer: Self-pay | Admitting: *Deleted

## 2012-06-20 ENCOUNTER — Emergency Department (HOSPITAL_COMMUNITY): Payer: Self-pay

## 2012-06-20 ENCOUNTER — Emergency Department (HOSPITAL_COMMUNITY)
Admission: EM | Admit: 2012-06-20 | Discharge: 2012-06-20 | Disposition: A | Discharge status: Home / Self Care | Payer: Self-pay | Attending: Emergency Medicine | Admitting: Emergency Medicine

## 2012-06-20 DIAGNOSIS — Z79899 Other long term (current) drug therapy: Secondary | ICD-10-CM | POA: Insufficient documentation

## 2012-06-20 DIAGNOSIS — K5900 Constipation, unspecified: Secondary | ICD-10-CM | POA: Insufficient documentation

## 2012-06-20 DIAGNOSIS — A599 Trichomoniasis, unspecified: Secondary | ICD-10-CM | POA: Insufficient documentation

## 2012-06-20 DIAGNOSIS — Z3202 Encounter for pregnancy test, result negative: Secondary | ICD-10-CM | POA: Insufficient documentation

## 2012-06-20 DIAGNOSIS — Z8701 Personal history of pneumonia (recurrent): Secondary | ICD-10-CM | POA: Insufficient documentation

## 2012-06-20 DIAGNOSIS — R079 Chest pain, unspecified: Secondary | ICD-10-CM | POA: Insufficient documentation

## 2012-06-20 DIAGNOSIS — R109 Unspecified abdominal pain: Secondary | ICD-10-CM

## 2012-06-20 DIAGNOSIS — Z9889 Other specified postprocedural states: Secondary | ICD-10-CM | POA: Insufficient documentation

## 2012-06-20 DIAGNOSIS — Z9089 Acquired absence of other organs: Secondary | ICD-10-CM | POA: Insufficient documentation

## 2012-06-20 DIAGNOSIS — R011 Cardiac murmur, unspecified: Secondary | ICD-10-CM | POA: Insufficient documentation

## 2012-06-20 DIAGNOSIS — I10 Essential (primary) hypertension: Secondary | ICD-10-CM | POA: Insufficient documentation

## 2012-06-20 DIAGNOSIS — Z8742 Personal history of other diseases of the female genital tract: Secondary | ICD-10-CM | POA: Insufficient documentation

## 2012-06-20 DIAGNOSIS — F17200 Nicotine dependence, unspecified, uncomplicated: Secondary | ICD-10-CM | POA: Insufficient documentation

## 2012-06-20 DIAGNOSIS — R1032 Left lower quadrant pain: Secondary | ICD-10-CM | POA: Insufficient documentation

## 2012-06-20 DIAGNOSIS — M79609 Pain in unspecified limb: Secondary | ICD-10-CM | POA: Insufficient documentation

## 2012-06-20 LAB — COMPREHENSIVE METABOLIC PANEL
ALT: 7 U/L (ref 0–35)
AST: 21 U/L (ref 0–37)
Alkaline Phosphatase: 73 U/L (ref 39–117)
CO2: 26 mEq/L (ref 19–32)
Chloride: 101 mEq/L (ref 96–112)
GFR calc Af Amer: >90 mL/min (ref >90)
GFR calc non Af Amer: >90 mL/min (ref >90)
Glucose, Bld: 107 mg/dL — ABNORMAL HIGH (ref 70–99)
Potassium: 3.3 mEq/L — ABNORMAL LOW (ref 3.5–5.1)
Sodium: 137 mEq/L (ref 135–145)
Total Bilirubin: 0.6 mg/dL (ref 0.3–1.2)

## 2012-06-20 LAB — CBC WITH DIFFERENTIAL/PLATELET
Basophils Absolute: 0 10*3/uL (ref 0.0–0.1)
HCT: 32 % — ABNORMAL LOW (ref 36.0–46.0)
Lymphocytes Relative: 21 % (ref 12–46)
Lymphs Abs: 2.4 10*3/uL (ref 0.7–4.0)
MCV: 80.2 fL (ref 78.0–100.0)
Neutro Abs: 7.9 10*3/uL — ABNORMAL HIGH (ref 1.7–7.7)
Platelets: 243 10*3/uL (ref 150–400)
RBC: 3.99 MIL/uL (ref 3.87–5.11)
RDW: 17.1 % — ABNORMAL HIGH (ref 11.5–15.5)
WBC: 11.7 10*3/uL — ABNORMAL HIGH (ref 4.0–10.5)

## 2012-06-20 LAB — URINALYSIS, ROUTINE W REFLEX MICROSCOPIC
Glucose, UA: NEGATIVE mg/dL
Ketones, ur: NEGATIVE mg/dL
Protein, ur: 30 mg/dL — AB
Urobilinogen, UA: 0.2 mg/dL (ref 0.0–1.0)

## 2012-06-20 LAB — URINE MICROSCOPIC-ADD ON

## 2012-06-20 LAB — POCT I-STAT TROPONIN I: Troponin i, poc: 0 ng/mL (ref 0.00–0.08)

## 2012-06-20 MED ORDER — ONDANSETRON HCL 4 MG/2ML IJ SOLN
4.0000 mg | Freq: Once | INTRAMUSCULAR | Status: AC
  Administered 2012-06-20: 4 mg via INTRAVENOUS
  Filled 2012-06-20: qty 2

## 2012-06-20 MED ORDER — METRONIDAZOLE 500 MG PO TABS: 500.0000 mg | ORAL_TABLET | Freq: Two times a day (BID) | ORAL | Status: DC

## 2012-06-20 MED ORDER — FENTANYL CITRATE 0.05 MG/ML IJ SOLN
100.0000 ug | Freq: Once | INTRAMUSCULAR | Status: AC
  Administered 2012-06-20: 100 ug via INTRAVENOUS
  Filled 2012-06-20: qty 2

## 2012-06-20 MED ORDER — MAGNESIUM CITRATE PO SOLN
0.5000 | Freq: Once | ORAL | Status: AC
  Administered 2012-06-20: 0.5 via ORAL
  Filled 2012-06-20: qty 296

## 2012-06-20 MED ORDER — SODIUM CHLORIDE 0.9 % IV BOLUS (SEPSIS)
1000.0000 mL | Freq: Once | INTRAVENOUS | Status: AC
  Administered 2012-06-20: 1000 mL via INTRAVENOUS

## 2012-06-20 MED ORDER — OXYCODONE-ACETAMINOPHEN 5-325 MG PO TABS
2.0000 | ORAL_TABLET | Freq: Once | ORAL | Status: AC
  Administered 2012-06-20: 2 via ORAL
  Filled 2012-06-20: qty 2

## 2012-06-20 MED ORDER — POLYETHYLENE GLYCOL 3350 17 GM/SCOOP PO POWD: 17.0000 g | Freq: Two times a day (BID) | ORAL | Status: DC

## 2012-06-20 MED ORDER — HYDROMORPHONE HCL PF 1 MG/ML IJ SOLN
1.0000 mg | Freq: Once | INTRAMUSCULAR | Status: AC
  Administered 2012-06-20: 1 mg via INTRAVENOUS
  Filled 2012-06-20: qty 1

## 2012-06-20 NOTE — ED Notes (Signed)
POCT pregnancy Negative 

## 2012-06-20 NOTE — ED Notes (Signed)
Per EMS:  Pt woke up this am with acute onset of chest pain, abdominal pain, and SOB.   BP 140/80, HR 74, RR 20, 100% O2.  Pt had recent surgery on her R foot.  Most pain was initially in chest but it's more in the abdomen now.  Resp even and slightly labored.  C&O x4.

## 2012-06-20 NOTE — ED Provider Notes (Signed)
History     CSN: [id number]  Arrival date & time 06/20/12  0624   First MD Initiated Contact with Patient 06/20/12 [phone]      CC: chest pain/abdominal pain  (Consider location/radiation/quality/duration/timing/severity/associated sxs/prior treatment) HPI Comments: Patient with h/o anemia, HTN, cholecystectomy, R foot surgery 2 days ago -- presents with c/o acute onset CP and LLQ pain that awoke her from sleep approximately 45 minutes PTA. No h/o ACS, stress test, cath. Was told to get a stress test done after previous admission 11/13 but this has not occurred. Chest pain has resolved but severe LLQ persists. No fever, cold sx, N/V/D, SOB, pleuritic CP, urinary symptoms. Patient is on her period and has h/o severe abdominal cramps but states this feels different. Onset acute. Course constant. Nothing makes sx better or worse. ASA PTA.   The history is provided by the patient.    Past Medical History  Diagnosis Date  . Heart murmur     "nothing to worry about"  . Hypertension     one time  . HTN (hypertension) 03/12/2012  . Pneumonia     "had it once a year for a few years"  last time was 3- 4 years ago.  Marland Kitchen History of blood transfusion     heavy periods  . Dysmenorrhea     Past Surgical History  Procedure Laterality Date  . Cholecystectomy    . Orif ankle fracture Right 06/18/2012    Procedure: OPEN REDUCTION INTERNAL FIXATION (ORIF) ANKLE FRACTURE;  Surgeon: Verlee Rossetti, MD;  Location: [facility];  Service: Orthopedics;  Laterality: Right;    Family History  Problem Relation Age of Onset  . Breast cancer Maternal Aunt     History  Substance Use Topics  . Smoking status: Current Every Day Smoker -- 0.01 packs/day for 20 years  . Smokeless tobacco: Not on file  . Alcohol Use: No    OB History   Grav Para Term Preterm Abortions TAB SAB Ect Mult Living                  Review of Systems  Constitutional: Negative for fever and diaphoresis.  HENT: Negative for neck  pain.   Eyes: Negative for redness.  Respiratory: Negative for cough and shortness of breath.   Cardiovascular: Positive for chest pain. Negative for palpitations and leg swelling.  Gastrointestinal: Positive for abdominal pain. Negative for nausea and vomiting.  Genitourinary: Negative for dysuria.  Musculoskeletal: Positive for arthralgias. Negative for back pain.  Skin: Negative for rash.  Neurological: Negative for syncope and light-headedness.    Allergies  Review of patient's allergies indicates no known allergies.  Home Medications   Current Outpatient Rx  Name  Route  Sig  Dispense  Refill  . ferrous sulfate 325 (65 FE) MG tablet   Oral   Take 1 tablet (325 mg total) by mouth 3 (three) times daily with meals.   90 tablet   0   . HYDROcodone-acetaminophen (NORCO) 10-325 MG per tablet   Oral   Take 1 tablet by mouth every 6 (six) hours as needed for pain.   30 tablet   0   . ketorolac (TORADOL) 10 MG tablet   Oral   Take 1 tablet (10 mg total) by mouth every 6 (six) hours as needed for pain.   20 tablet   0   . methocarbamol (ROBAXIN) 500 MG tablet   Oral   Take 1 tablet (500 mg total) by mouth 3 (  three) times daily as needed.   60 tablet   1   . oxyCODONE-acetaminophen (ROXICET) 5-325 MG per tablet   Oral   Take 1-2 tablets by mouth every 4 (four) hours as needed for pain.   60 tablet   0     LMP 05/30/2012  Physical Exam  Nursing note and vitals reviewed. Constitutional: She appears well-developed and well-nourished.  HENT:  Head: Normocephalic and atraumatic.  Eyes: Conjunctivae are normal. Right eye exhibits no discharge. Left eye exhibits no discharge.  Neck: Normal range of motion. Neck supple.  Cardiovascular: Normal rate, regular rhythm and normal heart sounds.   Pulmonary/Chest: Effort normal and breath sounds normal. She exhibits no tenderness.  Abdominal: Soft. She exhibits no distension. There is tenderness (moderate) in the left lower  quadrant. There is no rigidity, no rebound, no guarding, no CVA tenderness, no tenderness at McBurney's point and negative Murphy's sign.  Musculoskeletal:  Splint on right leg, patient can wiggle toes, cap refill normal bilaterally, toes are warm; no left leg swelling.   Neurological: She is alert.  Skin: Skin is warm and dry.  Psychiatric: She has a normal mood and affect.    ED Course  Procedures (including critical care time)  Labs Reviewed - No data to display Dg Chest Port 1v Same Day  06/18/2012  *RADIOLOGY REPORT*  Clinical Data: Fall.  Preoperative for ankle fracture.  PORTABLE CHEST - 1 VIEW SAME DAY  Comparison: 03/11/2012  Findings: Portable AP view of the chest demonstrates upper normal heart size.  The lungs appear clear.  No pleural effusion or discrete thoracic fracture identified.  IMPRESSION:  1.  No significant abnormality identified.   Original Report Authenticated By: Gaylyn Rong, M.D.      1. Abdominal pain   2. Constipation   3. Trichomoniasis     6:33 AM Patient seen and examined. Work-up initiated. Patient has already received ASA this AM. CP at this time is mostly resolved. Now mostly LLQ pain.   Vital signs reviewed and are as follows: Filed Vitals:   06/20/12 0715  BP: 118/68  Pulse: 62  Resp: 15    Date: 06/20/2012  Rate: 59  Rhythm: sinus bradycardia  QRS Axis: normal  Intervals: normal  ST/T Wave abnormalities: nonspecific T wave changes  Conduction Disutrbances:none  Narrative Interpretation:   Old EKG Reviewed: unchanged 03/12/12  8:32 AM Re-exam: patient feeling better. Informed of results to this point. Awaiting UA.   12:10 PM There was a delay in obtaining urine. Patient was seen by Dr. Anitra Lauth and agrees this is likely secondary to constipation. Abdomen remains soft and nontender during ED stay.  Patient informed of UA results.  While in the ED, the patient complains of worsening, sharp, right lower extremity pain. She  continues to be able to move toes. She has normal capillary refill in the toes are warm. Sensation is intact. IV and oral pain medication ordered prior to discharge. Patient to followup with orthopedic surgeon as planned.  The patient was urged to return to the Emergency Department immediately with worsening of current symptoms, worsening abdominal pain, persistent vomiting, blood noted in stools, fever, or any other concerns. The patient verbalized understanding.     MDM  Chest pain: resolved PTA, work-up reassuring. Do not suspect ACS. Do not feel symptoms are c/w PE -- no tachycardia, SOB, pleuritic CP.   Abd pain: work-up reassuring, pain controlled in ED, suspect 2/2 constipation demonstrated on x-ray, caused by narcotics use 2/2 recent  surgery. Afebrile, abd soft, NT in ED at time of d/c home. Do not suspect emergent etiology.   Leg pain: c/w recent surgery, patient has home pain medications. Toes well perfused, warm to touch, sensation intact.          Renne Crigler, [place] 06/20/12 1225

## 2012-06-21 NOTE — ED Provider Notes (Signed)
Medical screening examination/treatment/procedure(s) were performed by non-physician practitioner and as supervising physician I was immediately available for consultation/collaboration.   Chandra Feger, MD 06/21/12 0016 

## 2012-11-10 ENCOUNTER — Other Ambulatory Visit (HOSPITAL_COMMUNITY)
Admission: RE | Admit: 2012-11-10 | Discharge: 2012-11-10 | Disposition: A | Discharge status: Home / Self Care | Payer: No Typology Code available for payment source | Source: Ambulatory Visit | Attending: Emergency Medicine | Admitting: Emergency Medicine

## 2012-11-10 ENCOUNTER — Emergency Department (HOSPITAL_COMMUNITY)
Admission: EM | Admit: 2012-11-10 | Discharge: 2012-11-10 | Disposition: A | Discharge status: Home / Self Care | Payer: No Typology Code available for payment source | Source: Home / Self Care

## 2012-11-10 ENCOUNTER — Encounter (HOSPITAL_COMMUNITY): Payer: Self-pay | Admitting: Emergency Medicine

## 2012-11-10 ENCOUNTER — Emergency Department (INDEPENDENT_AMBULATORY_CARE_PROVIDER_SITE_OTHER): Payer: No Typology Code available for payment source

## 2012-11-10 DIAGNOSIS — N760 Acute vaginitis: Secondary | ICD-10-CM | POA: Insufficient documentation

## 2012-11-10 DIAGNOSIS — J069 Acute upper respiratory infection, unspecified: Secondary | ICD-10-CM

## 2012-11-10 DIAGNOSIS — N946 Dysmenorrhea, unspecified: Secondary | ICD-10-CM

## 2012-11-10 DIAGNOSIS — Z113 Encounter for screening for infections with a predominantly sexual mode of transmission: Secondary | ICD-10-CM | POA: Insufficient documentation

## 2012-11-10 DIAGNOSIS — N898 Other specified noninflammatory disorders of vagina: Secondary | ICD-10-CM

## 2012-11-10 LAB — POCT URINALYSIS DIP (DEVICE)
Nitrite: NEGATIVE
Protein, ur: NEGATIVE mg/dL
Specific Gravity, Urine: 1.01 (ref 1.005–1.030)
Urobilinogen, UA: 0.2 mg/dL (ref 0.0–1.0)

## 2012-11-10 LAB — POCT RAPID STREP A: Streptococcus, Group A Screen (Direct): NEGATIVE

## 2012-11-10 LAB — POCT PREGNANCY, URINE: Preg Test, Ur: NEGATIVE

## 2012-11-10 MED ORDER — HYDROCOD POLST-CHLORPHEN POLST 10-8 MG/5ML PO LQCR: 5.0000 mL | Freq: Two times a day (BID) | ORAL | Status: DC | PRN

## 2012-11-10 MED ORDER — CIPROFLOXACIN HCL 500 MG PO TABS
ORAL_TABLET | ORAL | Status: AC
  Filled 2012-11-10: qty 2

## 2012-11-10 MED ORDER — DM-GUAIFENESIN ER 60-1200 MG PO TB12: 1.0000 | ORAL_TABLET | Freq: Two times a day (BID) | ORAL | Status: DC

## 2012-11-10 MED ORDER — METHYLPREDNISOLONE 4 MG PO KIT: PACK | ORAL | Status: DC

## 2012-11-10 NOTE — ED Provider Notes (Signed)
History    CSN: [id number] Arrival date & time 11/10/12  4098  None    Chief Complaint  Patient presents with  . URI   (Consider location/radiation/quality/duration/timing/severity/associated sxs/prior Treatment) HPI Comments: 47 year old female presents c/o productive cough, nasal congestion, sore throat, subjective fever/chills.  This has been going on for a few days, not getting any better with OTCs.  She has been coughing so much today that she was throwing up from coughing.  The cough has been productive of sputum.  She says she has had bronchitis before but she did not feel as bad as she feels now and wants to make sure she doesn't have pneumonia.    Also, she c/o brownish vaginal discharge with foul odor along with infrequent, intermittent stabbing lower abdominal pain for the past 3 months since getting her IUD placed.  She does not think she could have an STD and states she has not been sexually active for years.    Patient is a 47 y.o. female presenting with URI.  URI Presenting symptoms: congestion, cough, fever and sore throat   Presenting symptoms: no ear pain and no rhinorrhea   Associated symptoms: no arthralgias, no myalgias and no wheezing    Past Medical History  Diagnosis Date  . Heart murmur     "nothing to worry about"  . Hypertension     one time  . HTN (hypertension) 03/12/2012  . Pneumonia     "had it once a year for a few years"  last time was 3- 4 years ago.  Marland Kitchen History of blood transfusion     heavy periods  . Dysmenorrhea    Past Surgical History  Procedure Laterality Date  . Cholecystectomy    . Orif ankle fracture Right 06/18/2012    Procedure: OPEN REDUCTION INTERNAL FIXATION (ORIF) ANKLE FRACTURE;  Surgeon: Verlee Rossetti, MD;  Location: [facility] OR;  Service: Orthopedics;  Laterality: Right;   Family History  Problem Relation Age of Onset  . Breast cancer Maternal Aunt    History  Substance Use Topics  . Smoking status: Current Every Day  Smoker -- 0.01 packs/day for 20 years  . Smokeless tobacco: Not on file  . Alcohol Use: No   OB History   Grav Para Term Preterm Abortions TAB SAB Ect Mult Living                 Review of Systems  Constitutional: Positive for fever and chills.  HENT: Positive for congestion and sore throat. Negative for ear pain, rhinorrhea and sinus pressure.   Eyes: Negative for visual disturbance.  Respiratory: Positive for cough and shortness of breath. Negative for chest tightness and wheezing.   Cardiovascular: Negative for chest pain, palpitations and leg swelling.  Gastrointestinal: Positive for abdominal pain. Negative for nausea and vomiting.  Endocrine: Negative for polydipsia and polyuria.  Genitourinary: Positive for vaginal discharge. Negative for dysuria, urgency, frequency, hematuria, flank pain, decreased urine volume, vaginal bleeding, difficulty urinating, genital sores, vaginal pain and dyspareunia.  Musculoskeletal: Negative for myalgias and arthralgias.  Skin: Negative for rash.  Neurological: Negative for dizziness, weakness and light-headedness.    Allergies  Review of patient's allergies indicates no known allergies.  Home Medications   Current Outpatient Rx  Name  Route  Sig  Dispense  Refill  . chlorpheniramine-HYDROcodone (TUSSIONEX PENNKINETIC ER) 10-8 MG/5ML LQCR   Oral   Take 5 mLs by mouth every 12 (twelve) hours as needed.   115 mL  0   . Dextromethorphan-Guaifenesin 60-1200 MG per 12 hr tablet   Oral   Take 1 tablet by mouth every 12 (twelve) hours.   30 tablet   1   . ferrous sulfate 325 (65 FE) MG tablet   Oral   Take 1 tablet (325 mg total) by mouth 3 (three) times daily with meals.   90 tablet   0   . HYDROcodone-acetaminophen (NORCO) 10-325 MG per tablet   Oral   Take 1 tablet by mouth every 6 (six) hours as needed for pain.   30 tablet   0   . ketorolac (TORADOL) 10 MG tablet   Oral   Take 1 tablet (10 mg total) by mouth every 6 (six)  hours as needed for pain.   20 tablet   0   . methocarbamol (ROBAXIN) 500 MG tablet   Oral   Take 1 tablet (500 mg total) by mouth 3 (three) times daily as needed.   60 tablet   1   . methylPREDNISolone (MEDROL DOSEPAK) 4 MG tablet      Use as directed   21 tablet   0   . metroNIDAZOLE (FLAGYL) 500 MG tablet   Oral   Take 1 tablet (500 mg total) by mouth 2 (two) times daily.   14 tablet   0   . oxyCODONE-acetaminophen (ROXICET) 5-325 MG per tablet   Oral   Take 1-2 tablets by mouth every 4 (four) hours as needed for pain.   60 tablet   0   . polyethylene glycol powder (GLYCOLAX/MIRALAX) powder   Oral   Take 17 g by mouth 2 (two) times daily.   255 g   0    BP 149/87  Pulse 67  Temp(Src) 98.1 F (36.7 C) (Oral)  Resp 18  SpO2 100%  LMP 10/11/2012 Physical Exam  Nursing note and vitals reviewed. Constitutional: She is oriented to person, place, and time. Vital signs are normal. She appears well-developed and well-nourished. No distress.  HENT:  Head: Atraumatic.  Eyes: EOM are normal. Pupils are equal, round, and reactive to light.  Cardiovascular: Normal rate, regular rhythm and normal heart sounds.  Exam reveals no gallop and no friction rub.   No murmur heard. Pulmonary/Chest: Effort normal and breath sounds normal. No respiratory distress. She has no wheezes. She has no rales.  Abdominal: Soft. There is no hepatosplenomegaly. There is tenderness in the suprapubic area and left lower quadrant. There is no rigidity, no rebound, no guarding, no CVA tenderness, no tenderness at McBurney's point and negative Murphy's sign.  Genitourinary: Vagina normal and uterus normal. No vaginal discharge found.  Neurological: She is alert and oriented to person, place, and time. She has normal strength.  Skin: Skin is warm and dry. She is not diaphoretic.  Psychiatric: She has a normal mood and affect. Her behavior is normal. Judgment normal.    ED Course  Procedures  (including critical care time) Labs Reviewed  WET PREP, GENITAL  POCT URINALYSIS DIP (DEVICE)  POCT PREGNANCY, URINE  POCT RAPID STREP A ([facility]ARE ONLY)  CERVICOVAGINAL ANCILLARY ONLY   Dg Chest 2 View  11/10/2012   *RADIOLOGY REPORT*  Clinical Data: Cough and shortness of breath.  CHEST - 2 VIEW  Comparison: Chest x-ray 06/20/2012.  Findings: Lung volumes are normal.  No consolidative airspace disease.  No pleural effusions.  No pneumothorax.  No pulmonary nodule or mass noted.  Pulmonary vasculature and the cardiomediastinal silhouette are within normal  limits.  IMPRESSION: 1. No radiographic evidence of acute cardiopulmonary disease.   Original Report Authenticated By: Trudie Reed, M.D.   1. URI (upper respiratory infection)   2. Vaginal discharge     MDM  Treat as URI.  She has f/u on 8/10 with GYN regarding vaginal discharge, keep f/u for that.  There is no discharge or obvious cause.  This is probably related to the IUD but I doubt the cause is infectious at this time.  Advised to limit douching be she has been doing that a lot to try to get rid of the discharge.     Meds ordered this encounter  Medications  . chlorpheniramine-HYDROcodone (TUSSIONEX PENNKINETIC ER) 10-8 MG/5ML LQCR    Sig: Take 5 mLs by mouth every 12 (twelve) hours as needed.    Dispense:  115 mL    Refill:  0  . methylPREDNISolone (MEDROL DOSEPAK) 4 MG tablet    Sig: Use as directed    Dispense:  21 tablet    Refill:  0  . Dextromethorphan-Guaifenesin 60-1200 MG per 12 hr tablet    Sig: Take 1 tablet by mouth every 12 (twelve) hours.    Dispense:  30 tablet    Refill:  1     Graylon Good, PA-C 11/10/12 1009

## 2012-11-10 NOTE — ED Notes (Signed)
Patient states she has a sore throat with coughing. Patient states she just feel bad.  Patient states she also has a vagina discharge with odor.  Patient states she has a mesh implanted and since then she has had a discharge.

## 2012-11-11 ENCOUNTER — Telehealth (HOSPITAL_COMMUNITY): Payer: Self-pay | Admitting: *Deleted

## 2012-11-11 MED ORDER — METRONIDAZOLE 500 MG PO TABS: 500.0000 mg | ORAL_TABLET | Freq: Two times a day (BID) | ORAL | Status: DC

## 2012-11-11 NOTE — ED Notes (Signed)
Pt. called back.  Pt. verified x 2 and given results.  Pt. told she needs Flagyl for bacterial vaginosis.   Pt. instructed to no alcohol while taking this medication.  Pt. told the Rx. Is at the [facility] on [address]. Pt. voiced understanding. Vassie Moselle 11/11/2012

## 2012-11-12 ENCOUNTER — Telehealth (HOSPITAL_COMMUNITY): Payer: Self-pay | Admitting: *Deleted

## 2012-11-12 LAB — CULTURE, GROUP A STREP

## 2012-11-12 NOTE — ED Notes (Signed)
7/22  GC/Chlamydia neg., Affirm: Candida and Trich neg., Gardnerella pos.  Throat culture: neg for strep.  [name] PA e-prescribed Flagyl to [facility] on [address].  I called pt. and left a message to call. Kimberly Jefferson 11/12/2012

## 2012-11-12 NOTE — ED Provider Notes (Signed)
Medical screening examination/treatment/procedure(s) were performed by a resident physician or non-physician practitioner and as the supervising physician I was immediately available for consultation/collaboration.  Rylen Swindler, MD   [employer] [facility] Kensli Bowley, MD 11/12/12 1041 

## 2013-03-11 ENCOUNTER — Emergency Department (HOSPITAL_COMMUNITY)
Admission: EM | Admit: 2013-03-11 | Discharge: 2013-03-11 | Disposition: A | Discharge status: Home / Self Care | Payer: Self-pay | Attending: Emergency Medicine | Admitting: Emergency Medicine

## 2013-03-11 ENCOUNTER — Encounter (HOSPITAL_COMMUNITY): Payer: Self-pay | Admitting: Emergency Medicine

## 2013-03-11 ENCOUNTER — Emergency Department (HOSPITAL_COMMUNITY): Payer: Self-pay

## 2013-03-11 ENCOUNTER — Other Ambulatory Visit: Payer: Self-pay

## 2013-03-11 DIAGNOSIS — Z79899 Other long term (current) drug therapy: Secondary | ICD-10-CM | POA: Insufficient documentation

## 2013-03-11 DIAGNOSIS — R011 Cardiac murmur, unspecified: Secondary | ICD-10-CM | POA: Insufficient documentation

## 2013-03-11 DIAGNOSIS — Z8701 Personal history of pneumonia (recurrent): Secondary | ICD-10-CM | POA: Insufficient documentation

## 2013-03-11 DIAGNOSIS — Z8742 Personal history of other diseases of the female genital tract: Secondary | ICD-10-CM | POA: Insufficient documentation

## 2013-03-11 DIAGNOSIS — Z3202 Encounter for pregnancy test, result negative: Secondary | ICD-10-CM | POA: Insufficient documentation

## 2013-03-11 DIAGNOSIS — R109 Unspecified abdominal pain: Secondary | ICD-10-CM | POA: Insufficient documentation

## 2013-03-11 DIAGNOSIS — I10 Essential (primary) hypertension: Secondary | ICD-10-CM | POA: Insufficient documentation

## 2013-03-11 DIAGNOSIS — F17200 Nicotine dependence, unspecified, uncomplicated: Secondary | ICD-10-CM | POA: Insufficient documentation

## 2013-03-11 LAB — URINE MICROSCOPIC-ADD ON

## 2013-03-11 LAB — BASIC METABOLIC PANEL
Calcium: 8.7 mg/dL (ref 8.4–10.5)
GFR calc Af Amer: >90 mL/min (ref >90)
GFR calc non Af Amer: 84 mL/min — ABNORMAL LOW (ref >90)
Potassium: 3.9 mEq/L (ref 3.5–5.1)
Sodium: 138 mEq/L (ref 135–145)

## 2013-03-11 LAB — URINALYSIS, ROUTINE W REFLEX MICROSCOPIC
Bilirubin Urine: NEGATIVE
Ketones, ur: NEGATIVE mg/dL
Specific Gravity, Urine: 1.03 (ref 1.005–1.030)
Urobilinogen, UA: 1 mg/dL (ref 0.0–1.0)

## 2013-03-11 LAB — POCT I-STAT TROPONIN I: Troponin i, poc: 0 ng/mL (ref 0.00–0.08)

## 2013-03-11 LAB — PRO B NATRIURETIC PEPTIDE: Pro B Natriuretic peptide (BNP): 35.4 pg/mL (ref 0–125)

## 2013-03-11 LAB — CBC
MCH: 27.8 pg (ref 26.0–34.0)
MCHC: 33.5 g/dL (ref 30.0–36.0)
Platelets: 218 10*3/uL (ref 150–400)
RDW: 17.6 % — ABNORMAL HIGH (ref 11.5–15.5)

## 2013-03-11 LAB — POCT PREGNANCY, URINE: Preg Test, Ur: NEGATIVE

## 2013-03-11 MED ORDER — HYDROMORPHONE HCL PF 1 MG/ML IJ SOLN
1.0000 mg | Freq: Once | INTRAMUSCULAR | Status: AC
  Administered 2013-03-11: 1 mg via INTRAVENOUS
  Filled 2013-03-11: qty 1

## 2013-03-11 MED ORDER — MORPHINE SULFATE 4 MG/ML IJ SOLN
4.0000 mg | Freq: Once | INTRAMUSCULAR | Status: AC
  Administered 2013-03-11: 4 mg via INTRAVENOUS
  Filled 2013-03-11: qty 1

## 2013-03-11 MED ORDER — ASPIRIN 81 MG PO CHEW
324.0000 mg | CHEWABLE_TABLET | Freq: Once | ORAL | Status: AC
  Administered 2013-03-11: 324 mg via ORAL
  Filled 2013-03-11: qty 4

## 2013-03-11 MED ORDER — ONDANSETRON HCL 4 MG/2ML IJ SOLN
4.0000 mg | Freq: Once | INTRAMUSCULAR | Status: AC
  Administered 2013-03-11: 4 mg via INTRAVENOUS
  Filled 2013-03-11: qty 2

## 2013-03-11 NOTE — ED Notes (Signed)
Pt. reports left chest pain radiating to left axilla with SOB and nausea onset 3 days ago . Pt. also reported low abdominal pain for 3 days .

## 2013-03-11 NOTE — ED Provider Notes (Signed)
CSN: [id number]     Arrival date & time 03/11/13  4696 History   First MD Initiated Contact with Patient 03/11/13 [phone]     Chief Complaint  Patient presents with  . Chest Pain   (Consider location/radiation/quality/duration/timing/severity/associated sxs/prior Treatment) Patient is a 47 y.o. female presenting with chest pain. The history is provided by the patient.  Chest Pain She has been having pain in her left anterior chest as well as across her lower abdomen for the last 3 days. Pain has been gradually getting worse and she currently rates pain at 9/10. Nothing makes it better nothing makes it worse. She denies fever, chills, sweats. There's been nausea but no vomiting. She denies dyspnea and diaphoresis. She has had urinary urgency, frequency, tenesmus, and dysuria and she has also had diarrhea. She's tried taking over-the-counter medications such as ibuprofen with no relief. She does smoke but denies history of hypertension, diabetes, hyperlipidemia.  Past Medical History  Diagnosis Date  . Heart murmur     "nothing to worry about"  . Hypertension     one time  . HTN (hypertension) 03/12/2012  . Pneumonia     "had it once a year for a few years"  last time was 3- 4 years ago.  Marland Kitchen History of blood transfusion     heavy periods  . Dysmenorrhea    Past Surgical History  Procedure Laterality Date  . Cholecystectomy    . Orif ankle fracture Right 06/18/2012    Procedure: OPEN REDUCTION INTERNAL FIXATION (ORIF) ANKLE FRACTURE;  Surgeon: Verlee Rossetti, MD;  Location: [facility] OR;  Service: Orthopedics;  Laterality: Right;   Family History  Problem Relation Age of Onset  . Breast cancer Maternal Aunt    History  Substance Use Topics  . Smoking status: Current Every Day Smoker -- 0.01 packs/day for 20 years  . Smokeless tobacco: Not on file  . Alcohol Use: No   OB History   Grav Para Term Preterm Abortions TAB SAB Ect Mult Living                 Review of Systems   Cardiovascular: Positive for chest pain.  All other systems reviewed and are negative.    Allergies  Review of patient's allergies indicates no known allergies.  Home Medications   Current Outpatient Rx  Name  Route  Sig  Dispense  Refill  . chlorpheniramine-HYDROcodone (TUSSIONEX PENNKINETIC ER) 10-8 MG/5ML LQCR   Oral   Take 5 mLs by mouth every 12 (twelve) hours as needed.   115 mL   0   . Dextromethorphan-Guaifenesin 60-1200 MG per 12 hr tablet   Oral   Take 1 tablet by mouth every 12 (twelve) hours.   30 tablet   1   . ferrous sulfate 325 (65 FE) MG tablet   Oral   Take 1 tablet (325 mg total) by mouth 3 (three) times daily with meals.   90 tablet   0   . HYDROcodone-acetaminophen (NORCO) 10-325 MG per tablet   Oral   Take 1 tablet by mouth every 6 (six) hours as needed for pain.   30 tablet   0   . ketorolac (TORADOL) 10 MG tablet   Oral   Take 1 tablet (10 mg total) by mouth every 6 (six) hours as needed for pain.   20 tablet   0   . methocarbamol (ROBAXIN) 500 MG tablet   Oral   Take 1 tablet (500 mg total) by  mouth 3 (three) times daily as needed.   60 tablet   1   . methylPREDNISolone (MEDROL DOSEPAK) 4 MG tablet      Use as directed   21 tablet   0   . metroNIDAZOLE (FLAGYL) 500 MG tablet   Oral   Take 1 tablet (500 mg total) by mouth 2 (two) times daily.   14 tablet   0   . oxyCODONE-acetaminophen (ROXICET) 5-325 MG per tablet   Oral   Take 1-2 tablets by mouth every 4 (four) hours as needed for pain.   60 tablet   0   . polyethylene glycol powder (GLYCOLAX/MIRALAX) powder   Oral   Take 17 g by mouth 2 (two) times daily.   255 g   0    BP 141/87  Pulse 84  Temp(Src) 98.3 F (36.8 C) (Oral)  Resp 14  SpO2 98%  LMP 03/07/2013 Physical Exam  Nursing note and vitals reviewed.  47 year old female, who appears uncomfortable, but is in no acute distress. Vital signs are significant for borderline hypertension with blood  pressure 141/87. Oxygen saturation is 98%, which is normal. Head is normocephalic and atraumatic. PERRLA, EOMI. Oropharynx is clear. Neck is nontender and supple without adenopathy or JVD. Back is nontender in the midline. There is moderate left CVA tenderness. Lungs are clear without rales, wheezes, or rhonchi. Chest is nontender. Heart has regular rate and rhythm without murmur. Abdomen is soft, flat, with moderate tenderness across the suprapubic area extending into the left lower and midabdomen. There is no rebound or guarding. There are no masses or hepatosplenomegaly and peristalsis is hypoactive. Extremities have no cyanosis or edema, full range of motion is present. Skin is warm and dry without rash. Neurologic: Mental status is normal, cranial nerves are intact, there are no motor or sensory deficits.  ED Course  Procedures (including critical care time) Labs Review Results for orders placed during the hospital encounter of 03/11/13  CBC      Result Value Range   WBC 11.3 (*) 4.0 - 10.5 K/uL   RBC 4.78  3.87 - 5.11 MIL/uL   Hemoglobin 13.3  12.0 - 15.0 g/dL   HCT 47.8  29.5 - 62.1 %   MCV 83.1  78.0 - 100.0 fL   MCH 27.8  26.0 - 34.0 pg   MCHC 33.5  30.0 - 36.0 g/dL   RDW 30.8 (*) 65.7 - 84.6 %   Platelets 218  150 - 400 K/uL  BASIC METABOLIC PANEL      Result Value Range   Sodium 138  135 - 145 mEq/L   Potassium 3.9  3.5 - 5.1 mEq/L   Chloride 105  96 - 112 mEq/L   CO2 22  19 - 32 mEq/L   Glucose, Bld 103 (*) 70 - 99 mg/dL   BUN 9  6 - 23 mg/dL   Creatinine, Ser 9.62  0.50 - 1.10 mg/dL   Calcium 8.7  8.4 - 95.2 mg/dL   GFR calc non Af Amer 84 (*) >90 mL/min   GFR calc Af Amer >90  >90 mL/min  PRO B NATRIURETIC PEPTIDE      Result Value Range   Pro B Natriuretic peptide (BNP) 35.4  0 - 125 pg/mL  URINALYSIS, ROUTINE W REFLEX MICROSCOPIC      Result Value Range   Color, Urine AMBER (*) YELLOW   APPearance CLOUDY (*) CLEAR   Specific Gravity, Urine 1.030  1.005 -  1.030  pH 6.0  5.0 - 8.0   Glucose, UA NEGATIVE  NEGATIVE mg/dL   Hgb urine dipstick LARGE (*) NEGATIVE   Bilirubin Urine NEGATIVE  NEGATIVE   Ketones, ur NEGATIVE  NEGATIVE mg/dL   Protein, ur NEGATIVE  NEGATIVE mg/dL   Urobilinogen, UA 1.0  0.0 - 1.0 mg/dL   Nitrite NEGATIVE  NEGATIVE   Leukocytes, UA SMALL (*) NEGATIVE  URINE MICROSCOPIC-ADD ON      Result Value Range   Squamous Epithelial / LPF RARE  RARE   WBC, UA 0-2  <3 WBC/hpf   RBC / HPF TOO NUMEROUS TO COUNT  <3 RBC/hpf   Bacteria, UA RARE  RARE  POCT I-STAT TROPONIN I      Result Value Range   Troponin i, poc 0.00  0.00 - 0.08 ng/mL   Comment 3           POCT PREGNANCY, URINE      Result Value Range   Preg Test, Ur NEGATIVE  NEGATIVE   Imaging Review Ct Abdomen Pelvis Wo Contrast  03/11/2013   CLINICAL DATA:  Chest pain on the left, radiating to the axilla. Low abdominal pain with nausea.  EXAM: CT ABDOMEN AND PELVIS WITHOUT CONTRAST  TECHNIQUE: Multidetector CT imaging of the abdomen and pelvis was performed following the standard protocol without intravenous contrast.  COMPARISON:  03/22/2007.  FINDINGS: BODY WALL: Unremarkable.  LOWER CHEST: Previously seen enlarged anterior pericardial lymph node not imaged currently.  ABDOMEN/PELVIS:  Liver: No focal abnormality.  Biliary: Cholecystectomy.  Pancreas: Unremarkable.  Spleen: Unremarkable.  Adrenals: Unremarkable.  Kidneys and ureters: No hydronephrosis or stone.  Bladder: Unremarkable.  Reproductive: The uterus is enlarged and globular appearing. On pelvic sonography 03/11/2012 there are no discrete fibroids, suggesting this represents adenomyosis. There is an IUD, which is not oriented as expected, with the lower stem directed towards, and possibly perforating the anterior uterine segment. The location of the fundic endometrial canal is uncertain, but the IUD appears somewhat low.  Bowel: No obstruction. Normal appendix.  Retroperitoneum: No mass or adenopathy.  Peritoneum:  No free fluid or gas.  Vascular: Duplicated IVC.  Aortoiliac atherosclerosis.  OSSEOUS: No acute abnormalities. Focally advanced degenerative disc disease at L5-S1.  IMPRESSION: 1. Unexpected orientation of an IUD, appearing low in the endometrial cavity and possibly perforating the anterior lower uterine segment. No serosal perforation. 2. Enlarged uterus, likely adenomyosis given no fibroids seen on sonography 03/11/2012.   Electronically Signed   By: Tiburcio Pea M.D.   On: 03/11/2013 07:17   Dg Chest 2 View  03/11/2013   CLINICAL DATA:  Chest pain, abdominal pain.  EXAM: CHEST  2 VIEW  COMPARISON:  Chest radiograph November 10, 2012  FINDINGS: Cardiomediastinal silhouette is unremarkable. The lungs are clear without pleural effusions or focal consolidations. Pulmonary vasculature is unremarkable. Trachea projects midline and there is no pneumothorax. Soft tissue planes and included osseous structures are nonsuspicious. Surgical clips in the right upper abdomen likely represent cholecystectomy.  IMPRESSION: No active cardiopulmonary disease.   Electronically Signed   By: Awilda Metro   On: 03/11/2013 05:49   Images viewed by me.   Date: 03/11/2013  Rate: 74  Rhythm: normal sinus rhythm and sinus arrhythmia  QRS Axis: normal  Intervals: normal  ST/T Wave abnormalities: ST depressions inferiorly  Conduction Disutrbances:none  Narrative Interpretation: Normal sinus rhythm with sinus arrhythmia, slight ST depression in inferior leads. When compared with ECG of 06/20/2012, and ST depression in inferior leads is now  present.  Old EKG Reviewed: changes noted   MDM   1. Abdominal pain    Chest pain and abdominal pain of uncertain cause. Urinary symptoms are certainly suggestive of urinary tract infection. Screening labs have been obtained. She's given aspirin and morphine.  She feels much better after above noted treatment. Urinalysis shows hematuria but no significant pyuria or bacteria. At  this point, symptoms seem most likely to be from a kidney stone. She will be sent for CT of abdomen and pelvis. Old records of been reviewed and she did have a prior ED visits for both abdominal and chest pain. She had a CT scan of her abdomen and pelvis in 2008, but that was with IV contrast. No calculi were seen at that time.  CT shows no evidence of renal calculi, but does show an abnormal orientation of IUD with possible alteration of the lower uterine segment. That would account for her her abdominal pain. He case is discussed with Dr. Arlyce Dice, who is a gynecologist who put in the IUD, who requested she be sent to his office to be evaluated today.  Dione Booze, MD 03/11/13 0800

## 2013-03-11 NOTE — ED Notes (Signed)
Pt d/c'd from IV, monitor, continuous pulse oximetry and blood pressure cuff; pt getting dressed to be discharged home; wheelchair at door; family at bedside

## 2013-04-20 ENCOUNTER — Encounter (HOSPITAL_COMMUNITY): Admission: RE | Payer: Self-pay | Source: Ambulatory Visit

## 2013-04-20 ENCOUNTER — Ambulatory Visit (HOSPITAL_COMMUNITY)
Admission: RE | Admit: 2013-04-20 | Payer: No Typology Code available for payment source | Source: Ambulatory Visit | Admitting: Obstetrics & Gynecology

## 2013-04-20 SURGERY — NOVASURE ABLATION
Anesthesia: Choice

## 2013-04-29 ENCOUNTER — Encounter (HOSPITAL_COMMUNITY): Payer: Self-pay | Admitting: Pharmacist

## 2013-04-29 ENCOUNTER — Encounter (HOSPITAL_COMMUNITY): Payer: Self-pay | Admitting: *Deleted

## 2013-05-05 ENCOUNTER — Ambulatory Visit (HOSPITAL_COMMUNITY)
Admission: RE | Admit: 2013-05-05 | Payer: BC Managed Care – PPO | Source: Ambulatory Visit | Admitting: Obstetrics & Gynecology

## 2013-05-05 HISTORY — DX: Anxiety disorder, unspecified: F41.9

## 2013-05-05 SURGERY — NOVASURE ABLATION
Anesthesia: Choice

## 2013-09-27 ENCOUNTER — Emergency Department (INDEPENDENT_AMBULATORY_CARE_PROVIDER_SITE_OTHER)
Admission: EM | Admit: 2013-09-27 | Discharge: 2013-09-27 | Disposition: A | Discharge status: Home / Self Care | Payer: BC Managed Care – PPO | Source: Home / Self Care

## 2013-09-27 ENCOUNTER — Encounter (HOSPITAL_COMMUNITY): Payer: Self-pay | Admitting: Emergency Medicine

## 2013-09-27 DIAGNOSIS — R112 Nausea with vomiting, unspecified: Secondary | ICD-10-CM

## 2013-09-27 DIAGNOSIS — A084 Viral intestinal infection, unspecified: Secondary | ICD-10-CM

## 2013-09-27 DIAGNOSIS — A088 Other specified intestinal infections: Secondary | ICD-10-CM

## 2013-09-27 DIAGNOSIS — R82998 Other abnormal findings in urine: Secondary | ICD-10-CM

## 2013-09-27 DIAGNOSIS — R319 Hematuria, unspecified: Secondary | ICD-10-CM

## 2013-09-27 LAB — POCT URINALYSIS DIP (DEVICE)
Bilirubin Urine: NEGATIVE
GLUCOSE, UA: NEGATIVE mg/dL
KETONES UR: NEGATIVE mg/dL
Nitrite: NEGATIVE
Protein, ur: 30 mg/dL — AB
SPECIFIC GRAVITY, URINE: 1.02 (ref 1.005–1.030)
UROBILINOGEN UA: 0.2 mg/dL (ref 0.0–1.0)
pH: 6.5 (ref 5.0–8.0)

## 2013-09-27 MED ORDER — SODIUM CHLORIDE 0.9 % IV SOLN
Freq: Once | INTRAVENOUS | Status: AC
  Administered 2013-09-27: 18:00:00 via INTRAVENOUS

## 2013-09-27 MED ORDER — CEPHALEXIN 500 MG PO CAPS: 500.0000 mg | ORAL_CAPSULE | Freq: Four times a day (QID) | ORAL | Status: DC

## 2013-09-27 MED ORDER — ONDANSETRON HCL 4 MG PO TABS: 4.0000 mg | ORAL_TABLET | Freq: Four times a day (QID) | ORAL | Status: DC

## 2013-09-27 MED ORDER — ONDANSETRON 4 MG PO TBDP
4.0000 mg | ORAL_TABLET | Freq: Once | ORAL | Status: AC
  Administered 2013-09-27: 4 mg via ORAL

## 2013-09-27 MED ORDER — ACETAMINOPHEN 325 MG PO TABS
650.0000 mg | ORAL_TABLET | Freq: Once | ORAL | Status: AC
  Administered 2013-09-27: 650 mg via ORAL

## 2013-09-27 MED ORDER — ACETAMINOPHEN 325 MG PO TABS
ORAL_TABLET | ORAL | Status: AC
  Filled 2013-09-27: qty 2

## 2013-09-27 MED ORDER — ONDANSETRON 4 MG PO TBDP
ORAL_TABLET | ORAL | Status: AC
  Filled 2013-09-27: qty 1

## 2013-09-27 NOTE — Discharge Instructions (Signed)
Nausea and Vomiting Nausea is a sick feeling that often comes before throwing up (vomiting). Vomiting is a reflex where stomach contents come out of your mouth. Vomiting can cause severe loss of body fluids (dehydration). Children and elderly adults can become dehydrated quickly, especially if they also have diarrhea. Nausea and vomiting are symptoms of a condition or disease. It is important to find the cause of your symptoms. CAUSES   Direct irritation of the stomach lining. This irritation can result from increased acid production (gastroesophageal reflux disease), infection, food poisoning, taking certain medicines (such as nonsteroidal anti-inflammatory drugs), alcohol use, or tobacco use.  Signals from the brain.These signals could be caused by a headache, heat exposure, an inner ear disturbance, increased pressure in the brain from injury, infection, a tumor, or a concussion, pain, emotional stimulus, or metabolic problems.  An obstruction in the gastrointestinal tract (bowel obstruction).  Illnesses such as diabetes, hepatitis, gallbladder problems, appendicitis, kidney problems, cancer, sepsis, atypical symptoms of a heart attack, or eating disorders.  Medical treatments such as chemotherapy and radiation.  Receiving medicine that makes you sleep (general anesthetic) during surgery. DIAGNOSIS Your caregiver may ask for tests to be done if the problems do not improve after a few days. Tests may also be done if symptoms are severe or if the reason for the nausea and vomiting is not clear. Tests may include:  Urine tests.  Blood tests.  Stool tests.  Cultures (to look for evidence of infection).  X-rays or other imaging studies. Test results can help your caregiver make decisions about treatment or the need for additional tests. TREATMENT You need to stay well hydrated. Drink frequently but in small amounts.You may wish to drink water, sports drinks, clear broth, or eat frozen  ice pops or gelatin dessert to help stay hydrated.When you eat, eating slowly may help prevent nausea.There are also some antinausea medicines that may help prevent nausea. HOME CARE INSTRUCTIONS   Take all medicine as directed by your caregiver.  If you do not have an appetite, do not force yourself to eat. However, you must continue to drink fluids.  If you have an appetite, eat a normal diet unless your caregiver tells you differently.  Eat a variety of complex carbohydrates (rice, wheat, potatoes, bread), lean meats, yogurt, fruits, and vegetables.  Avoid high-fat foods because they are more difficult to digest.  Drink enough water and fluids to keep your urine clear or pale yellow.  If you are dehydrated, ask your caregiver for specific rehydration instructions. Signs of dehydration may include:  Severe thirst.  Dry lips and mouth.  Dizziness.  Dark urine.  Decreasing urine frequency and amount.  Confusion.  Rapid breathing or pulse. SEEK IMMEDIATE MEDICAL CARE IF:   You have blood or brown flecks (like coffee grounds) in your vomit.  You have black or bloody stools.  You have a severe headache or stiff neck.  You are confused.  You have severe abdominal pain.  You have chest pain or trouble breathing.  You do not urinate at least once every 8 hours.  You develop cold or clammy skin.  You continue to vomit for longer than 24 to 48 hours.  You have a fever. MAKE SURE YOU:   Understand these instructions.  Will watch your condition.  Will get help right away if you are not doing well or get worse. Document Released: 04/09/2005 Document Revised: 07/02/2011 Document Reviewed: 09/06/2010 [facility] Patient Information 2014 [place], [place].  Viral Gastroenteritis Viral gastroenteritis  is also known as stomach flu. This condition affects the stomach and intestinal tract. It can cause sudden diarrhea and vomiting. The illness typically lasts 3 to 8 days.  Most people develop an immune response that eventually gets rid of the virus. While this natural response develops, the virus can make you quite ill. CAUSES  Many different viruses can cause gastroenteritis, such as rotavirus or noroviruses. You can catch one of these viruses by consuming contaminated food or water. You may also catch a virus by sharing utensils or other personal items with an infected person or by touching a contaminated surface. SYMPTOMS  The most common symptoms are diarrhea and vomiting. These problems can cause a severe loss of body fluids (dehydration) and a body salt (electrolyte) imbalance. Other symptoms may include:  Fever.  Headache.  Fatigue.  Abdominal pain. DIAGNOSIS  Your caregiver can usually diagnose viral gastroenteritis based on your symptoms and a physical exam. A stool sample may also be taken to test for the presence of viruses or other infections. TREATMENT  This illness typically goes away on its own. Treatments are aimed at rehydration. The most serious cases of viral gastroenteritis involve vomiting so severely that you are not able to keep fluids down. In these cases, fluids must be given through an intravenous line (IV). HOME CARE INSTRUCTIONS   Drink enough fluids to keep your urine clear or pale yellow. Drink small amounts of fluids frequently and increase the amounts as tolerated.  Ask your caregiver for specific rehydration instructions.  Avoid:  Foods high in sugar.  Alcohol.  Carbonated drinks.  Tobacco.  Juice.  Caffeine drinks.  Extremely hot or cold fluids.  Fatty, greasy foods.  Too much intake of anything at one time.  Dairy products until 24 to 48 hours after diarrhea stops.  You may consume probiotics. Probiotics are active cultures of beneficial bacteria. They may lessen the amount and number of diarrheal stools in adults. Probiotics can be found in yogurt with active cultures and in supplements.  Wash your  hands well to avoid spreading the virus.  Only take over-the-counter or prescription medicines for pain, discomfort, or fever as directed by your caregiver. Do not give aspirin to children. Antidiarrheal medicines are not recommended.  Ask your caregiver if you should continue to take your regular prescribed and over-the-counter medicines.  Keep all follow-up appointments as directed by your caregiver. SEEK IMMEDIATE MEDICAL CARE IF:   You are unable to keep fluids down.  You do not urinate at least once every 6 to 8 hours.  You develop shortness of breath.  You notice blood in your stool or vomit. This may look like coffee grounds.  You have abdominal pain that increases or is concentrated in one small area (localized).  You have persistent vomiting or diarrhea.  You have a fever.  The patient is a child younger than 3 months, and he or she has a fever.  The patient is a child older than 3 months, and he or she has a fever and persistent symptoms.  The patient is a child older than 3 months, and he or she has a fever and symptoms suddenly get worse.  The patient is a baby, and he or she has no tears when crying. MAKE SURE YOU:   Understand these instructions.  Will watch your condition.  Will get help right away if you are not doing well or get worse. Document Released: 04/09/2005 Document Revised: 07/02/2011 Document Reviewed: 01/24/2011 [employer] Patient Information  2014 [employer]. ° °

## 2013-09-27 NOTE — ED Notes (Signed)
Rx x 2 called in to [facility] , [address] at patient request. Spoke directly w pharmacy staff. IV d/c'd at release, catheter intact

## 2013-09-27 NOTE — ED Provider Notes (Signed)
Medical screening examination/treatment/procedure(s) were performed by a resident physician or non-physician practitioner and as the supervising physician I was immediately available for consultation/collaboration.  Lynne Leader, MD    Gregor Hams, MD 09/27/13 [phone]

## 2013-09-27 NOTE — ED Provider Notes (Signed)
CSN: [id number]     Arrival date & time 09/27/13  1536 History   First MD Initiated Contact with Patient 09/27/13 1709     Chief Complaint  Patient presents with  . GI Problem   (Consider location/radiation/quality/duration/timing/severity/associated sxs/prior Treatment) HPI Comments: 48 year old female presents with nausea and vomiting associated with diarrhea beginning today. She vomited 3 times today and is unable to keep fluids down. She has had 4 episodes of diarrhea stools described as loose and watery. There is some question as to whether she has had a fever at home. He is complaining of a headache as well as myalgias. Denies abdominal pain.   Past Medical History  Diagnosis Date  . History of blood transfusion  2013    heavy periods, MC 2 units transfuesd  . Dysmenorrhea   . SVD (spontaneous vaginal delivery)     x 2  . Hypertension     one time  . HTN (hypertension) 03/12/2012    one time no med  . Heart murmur     "nothing to worry about" dx child, no problems ever  . Anxiety     no meds  . Pneumonia      hx "had it once a year for a few years"  last time was 3- 4 years ago.   Past Surgical History  Procedure Laterality Date  . Cholecystectomy    . Orif ankle fracture Right 06/18/2012    Procedure: OPEN REDUCTION INTERNAL FIXATION (ORIF) ANKLE FRACTURE;  Surgeon: Augustin Schooling, MD;  Location: [facility];  Service: Orthopedics;  Laterality: Right;  . Cesarean section      x 1  . Tubal ligation     Family History  Problem Relation Age of Onset  . Breast cancer Maternal Aunt    History  Substance Use Topics  . Smoking status: Current Every Day Smoker -- 1.00 packs/day for 20 years    Types: Cigarettes  . Smokeless tobacco: Never Used  . Alcohol Use: No   OB History   Grav Para Term Preterm Abortions TAB SAB Ect Mult Living                 Review of Systems  Constitutional: Positive for fever, activity change and appetite change.  Respiratory: Negative for  cough, choking and shortness of breath.   Cardiovascular: Negative.   Gastrointestinal: Positive for nausea, vomiting and diarrhea. Negative for abdominal pain, constipation and blood in stool.  Genitourinary: Negative.  Negative for dysuria, urgency, frequency, vaginal bleeding and vaginal discharge.  Musculoskeletal: Positive for myalgias. Negative for arthralgias.  Skin: Negative.   Neurological: Positive for headaches. Negative for speech difficulty.    Allergies  Review of patient's allergies indicates no known allergies.  Home Medications   Prior to Admission medications   Medication Sig Start Date End Date Taking? Authorizing Provider  Calcium Carbonate-Vitamin D ([facility] CALCIUM/VIT D SOFT CHEWS PO) Take 2 tablets by mouth daily.    Historical Provider, MD  cephALEXin (KEFLEX) 500 MG capsule Take 1 capsule (500 mg total) by mouth 4 (four) times daily. 09/27/13   Janne Napoleon, NP  ondansetron (ZOFRAN) 4 MG tablet Take 1 tablet (4 mg total) by mouth every 6 (six) hours. Prn n and v. 09/27/13   Janne Napoleon, NP  oxyCODONE-acetaminophen (PERCOCET/ROXICET) 5-325 MG per tablet Take 1-2 tablets by mouth every 6 (six) hours as needed (for menstrual pain).    Historical Provider, MD   BP 128/61  Pulse 88  Temp(Src) 100.6  F (38.1 C) (Oral)  Resp 20  SpO2 95% Physical Exam  Nursing note and vitals reviewed. Constitutional: She is oriented to person, place, and time. She appears well-developed and well-nourished. No distress.  Appears mildly ill.  HENT:  Head: Normocephalic and atraumatic.  Mouth/Throat: Oropharynx is clear and moist. No oropharyngeal exudate.  Eyes: Conjunctivae and EOM are normal.  Neck: Normal range of motion. Neck supple.  Cardiovascular: Normal rate, regular rhythm and normal heart sounds.   No murmur heard. Pulmonary/Chest: Effort normal and breath sounds normal. No respiratory distress. She has no wheezes. She has no rales.  Abdominal: Soft. Bowel sounds are normal.  She exhibits no distension and no mass. There is no rebound and no guarding.  Mild tenderness in the left mid abdomen. A repeat examination approximately 1 minute later reveals no tenderness to the left abdomen rather mild tenderness just above the suprapubic area.  Musculoskeletal: She exhibits no edema.  Mild tenderness to the right trapezius ridge and insertion points to the right lateral neck.  Lymphadenopathy:    She has no cervical adenopathy.  Neurological: She is alert and oriented to person, place, and time. She exhibits normal muscle tone.  Skin: Skin is warm and dry.    ED Course  Procedures (including critical care time) Labs Review Labs Reviewed  POCT URINALYSIS DIP (DEVICE) - Abnormal; Notable for the following:    Hgb urine dipstick MODERATE (*)    Protein, ur 30 (*)    Leukocytes, UA SMALL (*)    All other components within normal limits  URINE CULTURE    Imaging Review No results found.  Results for orders placed during the hospital encounter of 09/27/13  POCT URINALYSIS DIP (DEVICE)      Result Value Ref Range   Glucose, UA NEGATIVE  NEGATIVE mg/dL   Bilirubin Urine NEGATIVE  NEGATIVE   Ketones, ur NEGATIVE  NEGATIVE mg/dL   Specific Gravity, Urine 1.020  1.005 - 1.030   Hgb urine dipstick MODERATE (*) NEGATIVE   pH 6.5  5.0 - 8.0   Protein, ur 30 (*) NEGATIVE mg/dL   Urobilinogen, UA 0.2  0.0 - 1.0 mg/dL   Nitrite NEGATIVE  NEGATIVE   Leukocytes, UA SMALL (*) NEGATIVE    MDM   1. Viral gastroenteritis   2. Nausea and vomiting in adult   3. Urine WBC increased   4. Hematuria       1620: feeling some better. C/O bones aching all over. No vomiting or diarrhea.  IV NS is running and had her zofran 4 mg PO.  There is a small WBC and bld in urine. Although not c/o of specific urinary sx's will tx with keflex. zofran for N,V.  Tylenol for fever and aches. If worse, new sx's problems go to the ED promptly.  IV, medication management and observation  for 90 minutes.  Janne Napoleon, NP 09/27/13 1845

## 2013-09-27 NOTE — ED Notes (Signed)
C/o n/v/d, unable to hold down any food or fluids all day, 3-4 episodes loose watery stools

## 2013-09-28 LAB — URINE CULTURE: Colony Count: 70000

## 2013-12-02 ENCOUNTER — Emergency Department (HOSPITAL_COMMUNITY)
Admission: EM | Admit: 2013-12-02 | Discharge: 2013-12-02 | Disposition: A | Discharge status: Home / Self Care | Payer: Self-pay | Attending: Emergency Medicine | Admitting: Emergency Medicine

## 2013-12-02 ENCOUNTER — Emergency Department (INDEPENDENT_AMBULATORY_CARE_PROVIDER_SITE_OTHER)
Admission: EM | Admit: 2013-12-02 | Discharge: 2013-12-02 | Disposition: A | Discharge status: Nursing Home (Includes ICF) | Payer: Self-pay | Source: Home / Self Care

## 2013-12-02 ENCOUNTER — Encounter (HOSPITAL_COMMUNITY): Payer: Self-pay | Admitting: Emergency Medicine

## 2013-12-02 DIAGNOSIS — T63441A Toxic effect of venom of bees, accidental (unintentional), initial encounter: Secondary | ICD-10-CM

## 2013-12-02 DIAGNOSIS — T6391XA Toxic effect of contact with unspecified venomous animal, accidental (unintentional), initial encounter: Secondary | ICD-10-CM

## 2013-12-02 DIAGNOSIS — Z8701 Personal history of pneumonia (recurrent): Secondary | ICD-10-CM | POA: Insufficient documentation

## 2013-12-02 DIAGNOSIS — T63461A Toxic effect of venom of wasps, accidental (unintentional), initial encounter: Secondary | ICD-10-CM

## 2013-12-02 DIAGNOSIS — I10 Essential (primary) hypertension: Secondary | ICD-10-CM | POA: Insufficient documentation

## 2013-12-02 DIAGNOSIS — T7840XA Allergy, unspecified, initial encounter: Secondary | ICD-10-CM

## 2013-12-02 DIAGNOSIS — Y9389 Activity, other specified: Secondary | ICD-10-CM | POA: Insufficient documentation

## 2013-12-02 DIAGNOSIS — R011 Cardiac murmur, unspecified: Secondary | ICD-10-CM | POA: Insufficient documentation

## 2013-12-02 DIAGNOSIS — Y9289 Other specified places as the place of occurrence of the external cause: Secondary | ICD-10-CM | POA: Insufficient documentation

## 2013-12-02 DIAGNOSIS — F17200 Nicotine dependence, unspecified, uncomplicated: Secondary | ICD-10-CM | POA: Insufficient documentation

## 2013-12-02 DIAGNOSIS — Z8659 Personal history of other mental and behavioral disorders: Secondary | ICD-10-CM | POA: Insufficient documentation

## 2013-12-02 MED ORDER — DIPHENHYDRAMINE HCL 50 MG/ML IJ SOLN
INTRAMUSCULAR | Status: AC
  Filled 2013-12-02: qty 1

## 2013-12-02 MED ORDER — FAMOTIDINE 20 MG PO TABS
ORAL_TABLET | ORAL | Status: AC
  Filled 2013-12-02: qty 1

## 2013-12-02 MED ORDER — EPINEPHRINE 0.3 MG/0.3ML IJ SOAJ: 0.3000 mg | Freq: Once | INTRAMUSCULAR | Status: DC | PRN

## 2013-12-02 MED ORDER — METHYLPREDNISOLONE SODIUM SUCC 125 MG IJ SOLR
INTRAMUSCULAR | Status: AC
  Filled 2013-12-02: qty 2

## 2013-12-02 MED ORDER — FAMOTIDINE IN NACL 20-0.9 MG/50ML-% IV SOLN: 20.0000 mg | Freq: Once | INTRAVENOUS | Status: DC

## 2013-12-02 MED ORDER — EPINEPHRINE HCL 1 MG/ML IJ SOLN
INTRAMUSCULAR | Status: AC
  Filled 2013-12-02: qty 1

## 2013-12-02 MED ORDER — EPINEPHRINE HCL 1 MG/ML IJ SOLN
0.6000 mg | Freq: Once | INTRAMUSCULAR | Status: AC
  Administered 2013-12-02: 0.6 mg via INTRAMUSCULAR

## 2013-12-02 MED ORDER — FAMOTIDINE 20 MG PO TABS
20.0000 mg | ORAL_TABLET | Freq: Once | ORAL | Status: AC
  Administered 2013-12-02: 20 mg via ORAL

## 2013-12-02 MED ORDER — DIPHENHYDRAMINE HCL 50 MG/ML IJ SOLN
50.0000 mg | Freq: Once | INTRAMUSCULAR | Status: AC
  Administered 2013-12-02: 50 mg via INTRAVENOUS

## 2013-12-02 MED ORDER — PREDNISONE 20 MG PO TABS: ORAL_TABLET | ORAL | Status: DC

## 2013-12-02 MED ORDER — METHYLPREDNISOLONE SODIUM SUCC 125 MG IJ SOLR
125.0000 mg | Freq: Once | INTRAMUSCULAR | Status: AC
  Administered 2013-12-02: 125 mg via INTRAVENOUS

## 2013-12-02 NOTE — ED Notes (Signed)
Pt reports yellow jacket sting about 30 minutes ago Facial swelling, dyspnea, jittery Dr. Georgina Snell is the room w/pt.

## 2013-12-02 NOTE — ED Notes (Signed)
Pt presents from UC. Pt received .6 epi. 125 solumederal and 20 pepcid and 50 mg benadryl after sting from yellow jacket.

## 2013-12-02 NOTE — ED Notes (Signed)
Pt  Stung  By  A  Yellow jacket  About  30  mins  Ago  Facial  Swelling  Itching  And  Tightness  In throat                    Anxious

## 2013-12-02 NOTE — ED Provider Notes (Signed)
Kimberly Jefferson is a 48 y.o. female who presents to Urgent Care today for bee sting. Patient was stung in the arm and posterior neck today about 30 mins prior to presentation. She notes whole body itching and trouble breathing. She feels that her throat is closing. She denies any vomiting but does note some nausea. She denies any history of bee sing. No fevers or chills.    Past Medical History  Diagnosis Date  . History of blood transfusion  2013    heavy periods, MC 2 units transfuesd  . Dysmenorrhea   . SVD (spontaneous vaginal delivery)     x 2  . Hypertension     one time  . HTN (hypertension) 03/12/2012    one time no med  . Heart murmur     "nothing to worry about" dx child, no problems ever  . Anxiety     no meds  . Pneumonia      hx "had it once a year for a few years"  last time was 3- 4 years ago.   History  Substance Use Topics  . Smoking status: Current Every Day Smoker -- 1.00 packs/day for 20 years    Types: Cigarettes  . Smokeless tobacco: Never Used  . Alcohol Use: No   ROS as above Medications: No current facility-administered medications for this encounter.   Current Outpatient Prescriptions  Medication Sig Dispense Refill  . Calcium Carbonate-Vitamin D ([facility] CALCIUM/VIT D SOFT CHEWS PO) Take 2 tablets by mouth daily.      . cephALEXin (KEFLEX) 500 MG capsule Take 1 capsule (500 mg total) by mouth 4 (four) times daily.  28 capsule  0  . ondansetron (ZOFRAN) 4 MG tablet Take 1 tablet (4 mg total) by mouth every 6 (six) hours. Prn n and v.  12 tablet  0  . oxyCODONE-acetaminophen (PERCOCET/ROXICET) 5-325 MG per tablet Take 1-2 tablets by mouth every 6 (six) hours as needed (for menstrual pain).        Exam:  BP 150/76  Pulse 76  Temp(Src) 98.6 F (37 C) (Oral)  Resp 20  SpO2 100%  LMP 12/02/2013 Gen: Well NAD severely itchy.  HEENT: EOMI,  MMM No tongue or lips swelling.  Lungs: Normal work of breathing. CTABL Heart: Tachy but regular no MRG Abd:  NABS, Soft. Nondistended, Nontender Exts: Brisk capillary refill, warm and well perfused.    Patient was given 0.6mg  IM epinephrine and 50mg  IV benadryl and 125mg  IV Solumedrol and 20mg  oral pepcid.   No results found for this or any previous visit (from the past 24 hour(s)). No results found.  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 Stabilized in the urgent care and transferred to the emergency department for further observation and evaluation and management  Discussed warning signs or symptoms. Please see discharge instructions. Patient expresses understanding.   This note was created using Systems analyst. Any transcription errors are unintended.    Gregor Hams, MD 12/02/13 [id number]

## 2013-12-02 NOTE — ED Notes (Signed)
Iv  Ns   tko  Nasal  o2  At  2  l  /  Min  Cardiac monitor

## 2013-12-02 NOTE — Discharge Instructions (Signed)
Allergies °Allergies may happen from anything your body is sensitive to. This may be food, medicines, pollens, chemicals, and nearly anything around you in everyday life that produces allergens. An allergen is anything that causes an allergy producing substance. Heredity is often a factor in causing these problems. This means you may have some of the same allergies as your parents. °Food allergies happen in all age groups. Food allergies are some of the most severe and life threatening. Some common food allergies are cow's milk, seafood, eggs, nuts, wheat, and soybeans. °SYMPTOMS  °· Swelling around the mouth. °· An itchy red rash or hives. °· Vomiting or diarrhea. °· Difficulty breathing. °SEVERE ALLERGIC REACTIONS ARE LIFE-THREATENING. °This reaction is called anaphylaxis. It can cause the mouth and throat to swell and cause difficulty with breathing and swallowing. In severe reactions only a trace amount of food (for example, peanut oil in a salad) may cause death within seconds. °Seasonal allergies occur in all age groups. These are seasonal because they usually occur during the same season every year. They may be a reaction to molds, grass pollens, or tree pollens. Other causes of problems are house dust mite allergens, pet dander, and mold spores. The symptoms often consist of nasal congestion, a runny itchy nose associated with sneezing, and tearing itchy eyes. There is often an associated itching of the mouth and ears. The problems happen when you come in contact with pollens and other allergens. Allergens are the particles in the air that the body reacts to with an allergic reaction. This causes you to release allergic antibodies. Through a chain of events, these eventually cause you to release histamine into the blood stream. Although it is meant to be protective to the body, it is this release that causes your discomfort. This is why you were given anti-histamines to feel better.  If you are unable to  pinpoint the offending allergen, it may be determined by skin or blood testing. Allergies cannot be cured but can be controlled with medicine. °Hay fever is a collection of all or some of the seasonal allergy problems. It may often be treated with simple over-the-counter medicine such as diphenhydramine. Take medicine as directed. Do not drink alcohol or drive while taking this medicine. Check with your caregiver or package insert for child dosages. °If these medicines are not effective, there are many new medicines your caregiver can prescribe. Stronger medicine such as nasal spray, eye drops, and corticosteroids may be used if the first things you try do not work well. Other treatments such as immunotherapy or desensitizing injections can be used if all else fails. Follow up with your caregiver if problems continue. These seasonal allergies are usually not life threatening. They are generally more of a nuisance that can often be handled using medicine. °HOME CARE INSTRUCTIONS  °· If unsure what causes a reaction, keep a diary of foods eaten and symptoms that follow. Avoid foods that cause reactions. °· If hives or rash are present: °¨ Take medicine as directed. °¨ You may use an over-the-counter antihistamine (diphenhydramine) for hives and itching as needed. °¨ Apply cold compresses (cloths) to the skin or take baths in cool water. Avoid hot baths or showers. Heat will make a rash and itching worse. °· If you are severely allergic: °¨ Following a treatment for a severe reaction, hospitalization is often required for closer follow-up. °¨ Wear a medic-alert bracelet or necklace stating the allergy. °¨ You and your family must learn how to give adrenaline or use   an anaphylaxis kit.  If you have had a severe reaction, always carry your anaphylaxis kit or EpiPen with you. Use this medicine as directed by your caregiver if a severe reaction is occurring. Failure to do so could have a fatal outcome. SEEK MEDICAL  CARE IF:  You suspect a food allergy. Symptoms generally happen within 30 minutes of eating a food.  Your symptoms have not gone away within 2 days or are getting worse.  You develop new symptoms.  You want to retest yourself or your child with a food or drink you think causes an allergic reaction. Never do this if an anaphylactic reaction to that food or drink has happened before. Only do this under the care of a caregiver. SEEK IMMEDIATE MEDICAL CARE IF:   You have difficulty breathing, are wheezing, or have a tight feeling in your chest or throat.  You have a swollen mouth, or you have hives, swelling, or itching all over your body.  You have had a severe reaction that has responded to your anaphylaxis kit or an EpiPen. These reactions may return when the medicine has worn off. These reactions should be considered life threatening. MAKE SURE YOU:   Understand these instructions.  Will watch your condition.  Will get help right away if you are not doing well or get worse. Document Released: 07/03/2002 Document Revised: 08/04/2012 Document Reviewed: 12/08/2007 [facility] Patient Information 2015 [place], [place]. This information is not intended to replace advice given to you by your health care provider. Make sure you discuss any questions you have with your health care provider.   Emergency Department Resource Guide 1) Find a Doctor and Pay Out of Pocket Although you won't have to find out who is covered by your insurance plan, it is a good idea to ask around and get recommendations. You will then need to call the office and see if the doctor you have chosen will accept you as a new patient and what types of options they offer for patients who are self-pay. Some doctors offer discounts or will set up payment plans for their patients who do not have insurance, but you will need to ask so you aren't surprised when you get to your appointment.  2) Contact Your Local Health  Department Not all health departments have doctors that can see patients for sick visits, but many do, so it is worth a call to see if yours does. If you don't know where your local health department is, you can check in your phone book. The CDC also has a tool to help you locate your state's health department, and many state websites also have listings of all of their local health departments.  3) Find a [facility] If your illness is not likely to be very severe or complicated, you may want to try a walk in clinic. These are popping up all over the country in pharmacies, drugstores, and shopping centers. They're usually staffed by nurse practitioners or physician assistants that have been trained to treat common illnesses and complaints. They're usually fairly quick and inexpensive. However, if you have serious medical issues or chronic medical problems, these are probably not your best option.  No Primary Care Doctor: - Call Health Connect at  ([phone] - they can help you locate a primary care doctor that  accepts your insurance, provides certain services, etc. - Physician Referral Service- [phone]  Chronic Pain Problems: Organization         Address  Phone  Notes  °[facility]  (336) 297-2271 Patients need to be referred by their primary care doctor.  ° °Medication Assistance: °Organization         Address  Phone   Notes  °[place] [facility] [address] °[place], [place] [address] (336) 641-8030 --Must be a resident of [place] °-- Must have NO insurance coverage whatsoever (no Medicaid/ Medicare, etc.) °-- The pt. MUST have a primary care doctor that directs their care regularly and follows them in the community °  °[facility]  (866) 331-1348   °[employer]  (888) 892-1162   ° °Agencies that provide inexpensive medical care: °Organization         Address  Phone   Notes  °[facility]  (336) 832-8035   °[facility]    (336) 832-7272   °[facility] [address] °[place], [place] [address] (336) 832-4777   °[facility] [address], °[place] (336) 271-4999   °[facility]    (336) 373-0678   °[facility]    (336) 272-1050   °[facility] ° [address], [place] Phone:  (336) 832-4444, Fax:  (336) 832-4440 Hours of Operation:  9 am - 6 pm, M-F.  Also accepts Medicaid/Medicare and self-pay.  °[facility] ° [address], [place] Phone: (336) 832-3150, Fax: (336) [phone]. Hours of Operation:  8:30 am - 5:30 pm, M-F.  Also accepts Medicaid and self-pay.  °[facility] [place] [address], [place] Phone: (336) 878-6027   °[facility] [address], [place], [place] (336)723-1848, Ext. 123 Mondays & Thursdays: 7-9 AM.  First 15 patients are seen on a first come, first serve basis. °  ° °Medicaid-accepting [place] Providers: ° °Organization         Address  Phone   Notes  °[facility] [address], [place] (336) 641-2100 Also accepts self-pay patients.  °[facility] [address], [place] ° (336) 856-9996   °[facility] [address], [place] (336) 288-8857   °[facility] [address] [place] [address], [place] (336) 299-7000   °Veita Bland [address], [place]  ° (336) [phone] Only accepts [place] Access Medicaid patients after they have their name applied to their card.  ° °Self-Pay (no insurance) in [place]: ° °Organization         Address  Phone   Notes  °Sickle Cell Patients, [facility] [address], [place] (336) 832-1970   °[facility] [address], [place] (336) 832-4400   °[facility] ° [address],  [place] (336) 992-4800   °[facility]/Dr. Osei-Bonsu ° [address] [place] [address], [place] or [address], [place] (336) [phone] Phone number for both [place] and [place] locations is the same.  °[facility] [address], [place] (336) 299-0000   °[facility] [address] [place] [address], [place] or [address] (336) 852-7530 °(336) 878-2260   °[facility] [address],  (336) 350-1642, phone; (336) 294-5005, fax Sees patients 1st and 3rd Saturday of every month.  Must not qualify for public or private insurance (  i.e. Medicaid, Medicare, [place] Health Choice, Veterans' Benefits) • Household income should be no more than 200% of the poverty level •The clinic cannot treat you if you are pregnant or think you are pregnant • Sexually transmitted diseases are not treated at the clinic.  ° ° °Dental Care: °Organization         Address  Phone  Notes  °[facility] [address], [place] (336) 641-6152 Accepts children up to age 21 who are enrolled in Medicaid or [place] Health Choice; pregnant women with a Medicaid card; and children who have applied for Medicaid or [place] Health Choice, but were declined, whose parents can pay a reduced fee at time of service.  °[facility] [place]  [address], [place] (336) 641-7733 Accepts children up to age 21 who are enrolled in Medicaid or [place] Health Choice; pregnant women with a Medicaid card; and children who have applied for Medicaid or [place] Health Choice, but were declined, whose parents can pay a reduced fee at time of service.  °[facility] ° [address], [place] (336) 641-4533 Patients are seen by appointment only. Walk-ins are not accepted. [facility] will see patients 18 years of age and older. °Monday - Tuesday (8am-5pm) °Most Wednesdays  (8:30-5pm) °$30 per visit, cash only  °[facility] ° [address], [place] (336) 641-4533 Patients are seen by appointment only. Walk-ins are not accepted. [facility] will see patients 18 years of age and older. °One Wednesday Evening (Monthly: Volunteer Based).  $30 per visit, cash only  °[facility]  (919) 537-3737 for adults; Children under age 4, call Graduate Pediatric Dentistry at ([phone]) [phone]. Children aged 4-14, please call ([phone]) [phone] to request a pediatric application. ° Dental services are provided in all areas of dental care including fillings, crowns and bridges, complete and partial dentures, implants, gum treatment, root canals, and extractions. Preventive care is also provided. Treatment is provided to both adults and children. °Patients are selected via a lottery and there is often a waiting list. °  °[facility] [address], ° ° (336) 763-8833 [email or web address] °  °[facility] [address], [place], [place] (336)723-1848, Ext. 123 Second and Fourth Thursday of each month, opens at 6:30 AM; Clinic ends at 9 AM.  Patients are seen on a first-come first-served basis, and a limited number are seen during each clinic.  ° °[facility] ° [address], [place], [place] (336) 723-7904   Eligibility Requirements °You must have lived in [place], or [place] counties for at least the last three months. °  You cannot be eligible for state or federal sponsored healthcare insurance, including Veterans Administration, Medicaid, or Medicare. °  You generally cannot be eligible for healthcare insurance through your employer.  °  How to apply: °Eligibility screenings are held every Tuesday and Wednesday afternoon from 1:00 pm until 4:00 pm. You do not need an appointment for the interview!  °[facility] [address], [place],  336-631-2330   °[place]  [facility]  336-342-8273   °[place]  336-703-3100   °[facility]  336-570-6415   ° °Behavioral Health Resources in the Community: °Intensive Outpatient Programs °Organization         Address  Phone  Notes  °[place]   [facility] [address], [place], [place] 336-878-6098   °[facility] [address], [place], [place] 336-832-9800   °[facility] [address], [place], [place] ° 336-882-2125   °[facility] [address],  °[place], [place] 1-800-853-5163 or 336-641-4981   °Substance Abuse Resources °Organization         Address  Phone  Notes  °[facility]  336-882-2125   °[facility]  336-784-9470   °[facility]  336-285-9073   °[facility]  336-845-3988   °Residential & Outpatient Substance Abuse Program  1-800-659-3381   °Psychological Services °Organization         Address  Phone  Notes  ° Health  336- 832-9600   °[facility]  336- 378-7881   °[facility] [address], [place] 1-800-853-5163 or 336-641-4981   ° °Mobile Crisis Teams °Organization         Address  Phone  Notes  °[facility], Mobile Crisis Care Unit  1-877-626-1772   °[facility]ive °[facility] ° [address] [place], [place] 336-834-9664   °Sharon DeEsch [address] °[place] 336-554-5454   ° °Self-Help/Support Groups °Organization         Address  Phone             Notes  °[facility] [place] - variety of support groups  336- 373-1402 Call for more information  °Narcotics Anonymous (NA), [facility] [address], °[place]  2 meetings at this location  ° °Residential Treatment Programs °Organization         Address  Phone  Notes  °[facility] [address],    °[place]  1-866-801-8205   °[facility] ° [address], [place], [place]  704-293-8524   °[facility] [address], [place] 336-845-3988 Admissions: 8am-3pm M-F  °[facility] [address]    °[place], [place] 336-841-1104   °[facility] [address], [place], [place] 336-379-7146   °[facility] [address]  °[place], [place] 336-285-9073   °[facility] - Intensive Outpatient [address], [place], [place] 336-852-3033   °[facility] [address]  °[place], [place] 1-877-615-2722 or 336-784-9470   °[facility] [address] [place], [place] 336-227-7417 Accepts Medicaid  °[facility] [address]  °[place] 1-800-659-3381 Substance Abuse/Addiction Treatment  ° °[place] Behavioral Health Resources °Organization         Address  Phone  Notes  °[facility]  (888) 581-9988   °Julie Brannon, PhD [address] [place], [place]   (336) 349-5553 or (336) 951-0000   °[facility]   [address] °[place], [place] (336) 349-4454   °[facility] [address], [place], [place] (336) 342-8316 Insurance/Medicaid/sponsorship through [facility]  °[facility] [address]                                    [place], [place] (336) 342-8316 Therapy/tele-psych/case  °[facility] [address]  ° [place], [place] (336) 349-2233    °Dr. Arfeen  (336) 349-4544   °[facility] [place]  [employer] [place] [facility] 1)   [address], [place] °2) [address], [place] °3)  [address], [place] (336) 349-3220 °(336) 342-7768 ° °(336) 342-8140   °[place] [facility] (336) 342-1394 or (336) 342-3537 (After Hours)    ° ° ° °

## 2013-12-02 NOTE — ED Notes (Signed)
PA at bedside.

## 2013-12-02 NOTE — ED Provider Notes (Signed)
CSN: [id number]     Arrival date & time 12/02/13  1138 History   First MD Initiated Contact with Patient 12/02/13 1139     Chief Complaint  Patient presents with  . Allergic Reaction     (Consider location/radiation/quality/duration/timing/severity/associated sxs/prior Treatment) HPI Kimberly Jefferson is a 48 y.o. female who presents today from Urgent Care facilty after bee sting x2 from yellow jacket. Stings to posterior neck and lateral L elbow at approx 10:30am. She said she was stung once before a year and a half ago with no reaction. This time she said she immediately felt her face begin to swell and began having difficulty breathing. She denied any tongue swelling and felt like her breathing difficulty was due to anxiety. At urgent care she received:  .6 epi. 125 solumederal, 20 pepcid, 50 mg benadryl.  In the ED she reports all symptoms have resolved, she is breathing clearly, no swelling to the face, No paraesthesias, no pruritis.  Vitals stable- O2 sat 100% She denies fevers, chills, chest pain, N/V, D/C   Past Medical History  Diagnosis Date  . History of blood transfusion  2013    heavy periods, MC 2 units transfuesd  . Dysmenorrhea   . SVD (spontaneous vaginal delivery)     x 2  . Hypertension     one time  . HTN (hypertension) 03/12/2012    one time no med  . Heart murmur     "nothing to worry about" dx child, no problems ever  . Anxiety     no meds  . Pneumonia      hx "had it once a year for a few years"  last time was 3- 4 years ago.   Past Surgical History  Procedure Laterality Date  . Cholecystectomy    . Orif ankle fracture Right 06/18/2012    Procedure: OPEN REDUCTION INTERNAL FIXATION (ORIF) ANKLE FRACTURE;  Surgeon: Augustin Schooling, MD;  [place]  Service: Orthopedics;  Laterality: Right;  . Cesarean section      x 1  . Tubal ligation     Family History  Problem Relation Age of Onset  . Breast cancer Maternal Aunt    History  Substance Use  Topics  . Smoking status: Current Every Day Smoker -- 1.00 packs/day for 20 years    Types: Cigarettes  . Smokeless tobacco: Never Used  . Alcohol Use: No   OB History   Grav Para Term Preterm Abortions TAB SAB Ect Mult Living                 Review of Systems  Constitutional: Negative for fever and diaphoresis.  HENT: Negative for drooling.   Eyes: Negative for visual disturbance.  Respiratory: Negative for chest tightness and shortness of breath.   Cardiovascular: Negative for chest pain.  Gastrointestinal: Negative for abdominal pain.  Musculoskeletal: Negative for myalgias.  Skin:       Stings to L lateral elbow and posterior neck.  Allergic/Immunologic: Negative for environmental allergies and food allergies.  Neurological: Negative for weakness and numbness.      Allergies  Bee venom  Home Medications   Prior to Admission medications   Medication Sig Start Date End Date Taking? Authorizing Provider  Aspirin-Acetaminophen (GOODYS BODY PAIN PO) Take 1 packet by mouth every 6 (six) hours as needed (for pain).    Yes Historical Provider, MD  EPINEPHrine (EPIPEN 2-PAK) 0.3 mg/0.3 mL IJ SOAJ injection Inject 0.3 mLs (0.3 mg total) into  the muscle once as needed (for severe allergic reaction). CAll 911 immediately if you have to use this medicine 12/02/13   Verl Dicker, PA-C  predniSONE (DELTASONE) 20 MG tablet 3 tabs po day one, then 2 tabs daily x 4 days 12/02/13   Viona Gilmore Kashmir Lysaght, PA-C   BP 134/87  Pulse 74  Temp(Src) 97.9 F (36.6 C) (Oral)  Resp 16  SpO2 100%  LMP 12/02/2013 Physical Exam  Nursing note and vitals reviewed. Constitutional: She is oriented to person, place, and time. She appears well-developed and well-nourished.  HENT:  Head: Normocephalic and atraumatic.  Mouth/Throat: Oropharynx is clear and moist.  No evidence of periorbital swelling or generalized facial edema. Mallampati IV  Eyes: Pupils are equal, round, and reactive to light.  Right eye exhibits no discharge. Left eye exhibits no discharge.  Neck: Normal range of motion. Neck supple.  Cardiovascular: Normal rate, regular rhythm and normal heart sounds.   Pulmonary/Chest: Effort normal and breath sounds normal. No respiratory distress. She has no wheezes.  Abdominal: Soft. There is no tenderness.  Musculoskeletal: Normal range of motion.  Neurological: She is alert and oriented to person, place, and time.  Skin: Skin is warm and dry.  Very mild erythema to sting sites. No induration or local edema appreciated.    ED Course  Procedures (including critical care time) Labs Review Labs Reviewed - No data to display  Imaging Review No results found.   EKG Interpretation None      MDM  Pt resting comfortably in ED Vitals stable- afebrile-  O2 sat 100% Says her oropharynx has always been difficult for providers to visualize in the past. Will obs for 4 hrs since 11:00am DC with Pred taper, epi pen and referral to allergist Final diagnoses:  Allergic reaction, initial encounter   Meds given in ED:  Medications - No data to display  New Prescriptions   EPINEPHRINE (EPIPEN 2-PAK) 0.3 MG/0.3 ML IJ SOAJ INJECTION    Inject 0.3 mLs (0.3 mg total) into the muscle once as needed (for severe allergic reaction). CAll 911 immediately if you have to use this medicine   PREDNISONE (DELTASONE) 20 MG TABLET    3 tabs po day one, then 2 tabs daily x 4 days   Prior to patient discharge, I discussed and reviewed this case with Dr.McManus         Verl Dicker, PA-C 12/02/13 1511

## 2013-12-02 NOTE — Progress Notes (Signed)
ED CM received  Call from patient concerning not being able to afford an Epipen that was prescribed today in the ED. Explained to patient that I would not be able to assist her with Epipen. Patient reported that the EDP suggested to use benadryl if she was unable to afford an Epipen. Patient verbalized understanding. Also printed a Epipen Savings Card and texted copy to patient that may be able to assist with cost.  Pt verbalized appreciation for the assistance. No further CM needs identified.

## 2013-12-06 NOTE — ED Provider Notes (Signed)
Medical screening examination/treatment/procedure(s) were performed by non-physician practitioner and as supervising physician I was immediately available for consultation/collaboration.   EKG Interpretation None        Francine Graven, DO 12/06/13 1746

## 2014-02-04 ENCOUNTER — Emergency Department (INDEPENDENT_AMBULATORY_CARE_PROVIDER_SITE_OTHER)
Admission: EM | Admit: 2014-02-04 | Discharge: 2014-02-04 | Disposition: A | Discharge status: Home / Self Care | Payer: Self-pay | Source: Home / Self Care | Attending: Emergency Medicine | Admitting: Emergency Medicine

## 2014-02-04 ENCOUNTER — Encounter (HOSPITAL_COMMUNITY): Payer: Self-pay | Admitting: Emergency Medicine

## 2014-02-04 DIAGNOSIS — J40 Bronchitis, not specified as acute or chronic: Secondary | ICD-10-CM

## 2014-02-04 MED ORDER — HYDROCODONE-HOMATROPINE 5-1.5 MG/5ML PO SYRP: 5.0000 mL | ORAL_SOLUTION | Freq: Four times a day (QID) | ORAL | Status: DC | PRN

## 2014-02-04 MED ORDER — IPRATROPIUM BROMIDE 0.06 % NA SOLN: 2.0000 | NASAL | Status: DC | PRN

## 2014-02-04 NOTE — ED Provider Notes (Signed)
CSN: [id number]     Arrival date & time 02/04/14  1859 History   First MD Initiated Contact with Patient 02/04/14 1931     Chief Complaint  Patient presents with  . Cough  . URI   (Consider location/radiation/quality/duration/timing/severity/associated sxs/prior Treatment) HPI    48 year old female presents complaining of cough, chest congestion, body aches, headache, diarrhea. This started 2 days ago. She feels like all her symptoms are just coming from the bad coughing. She has tried taking over-the-counter medications without relief. She denies chest pain, shortness of breath, pleuritic chest pain, fever, chills, vomiting, abdominal pain. No recent travel or sick contacts. No significant past medical history or history of pneumonia. Also complains of excessive rhinorrhea  Past Medical History  Diagnosis Date  . History of blood transfusion  2013    heavy periods, MC 2 units transfuesd  . Dysmenorrhea   . SVD (spontaneous vaginal delivery)     x 2  . Hypertension     one time  . HTN (hypertension) 03/12/2012    one time no med  . Heart murmur     "nothing to worry about" dx child, no problems ever  . Anxiety     no meds  . Pneumonia      hx "had it once a year for a few years"  last time was 3- 4 years ago.   Past Surgical History  Procedure Laterality Date  . Orif ankle fracture Right 06/18/2012    Procedure: OPEN REDUCTION INTERNAL FIXATION (ORIF) ANKLE FRACTURE;  Surgeon: Augustin Schooling, MD;  Location: [facility];  Service: Orthopedics;  Laterality: Right;  . Cesarean section  1988    x 1  . Tubal ligation  1990  . Cholecystectomy  1990   Family History  Problem Relation Age of Onset  . Breast cancer Maternal Aunt    History  Substance Use Topics  . Smoking status: Current Every Day Smoker -- 1.00 packs/day for 20 years    Types: Cigarettes  . Smokeless tobacco: Never Used  . Alcohol Use: No   OB History   Grav Para Term Preterm Abortions TAB SAB Ect Mult Living                Review of Systems  Constitutional: Positive for fatigue. Negative for fever and chills.  HENT: Positive for congestion, sinus pressure and sore throat.   Respiratory: Positive for cough. Negative for chest tightness and shortness of breath.   Cardiovascular: Negative for chest pain.  Gastrointestinal: Positive for diarrhea. Negative for nausea, vomiting and abdominal pain.  All other systems reviewed and are negative.   Allergies  Bee venom  Home Medications   Prior to Admission medications   Medication Sig Start Date End Date Taking? Authorizing Provider  acetaminophen (TYLENOL) 325 MG tablet Take 325 mg by mouth every 6 (six) hours as needed.   Yes Historical Provider, MD  pseudoephedrine-acetaminophen (TYLENOL SINUS) 30-500 MG TABS Take 1 tablet by mouth every 4 (four) hours as needed.   Yes Historical Provider, MD  Pseudoephedrine-APAP-DM (DAYQUIL PO) Take by mouth.   Yes Historical Provider, MD  Aspirin-Acetaminophen (GOODYS BODY PAIN PO) Take 1 packet by mouth every 6 (six) hours as needed (for pain).     Historical Provider, MD  EPINEPHrine (EPIPEN 2-PAK) 0.3 mg/0.3 mL IJ SOAJ injection Inject 0.3 mLs (0.3 mg total) into the muscle once as needed (for severe allergic reaction). CAll 911 immediately if you have to use this medicine 12/02/13  Viona Gilmore Cartner, PA-C  HYDROcodone-homatropine (HYCODAN) 5-1.5 MG/5ML syrup Take 5 mLs by mouth every 6 (six) hours as needed for cough. 02/04/14   Freeman Caldron Farin Buhman, PA-C  ipratropium (ATROVENT) 0.06 % nasal spray Place 2 sprays into both nostrils every 4 (four) hours as needed for rhinitis. 02/04/14   Liam Graham, PA-C  predniSONE (DELTASONE) 20 MG tablet 3 tabs po day one, then 2 tabs daily x 4 days 12/02/13   Viona Gilmore Cartner, PA-C   BP 166/92  Pulse 89  Temp(Src) 98.8 F (37.1 C) (Oral)  Resp 16  SpO2 100%  LMP 01/27/2014 Physical Exam  Nursing note and vitals reviewed. Constitutional: She is oriented to person,  place, and time. Vital signs are normal. She appears well-developed and well-nourished. No distress.  HENT:  Head: Normocephalic and atraumatic.  Right Ear: Tympanic membrane, external ear and ear canal normal.  Left Ear: Tympanic membrane, external ear and ear canal normal.  Nose: Nose normal. Right sinus exhibits no maxillary sinus tenderness and no frontal sinus tenderness. Left sinus exhibits no maxillary sinus tenderness and no frontal sinus tenderness.  Mouth/Throat: Uvula is midline, oropharynx is clear and moist and mucous membranes are normal. No oropharyngeal exudate or posterior oropharyngeal erythema.  Eyes: Conjunctivae are normal. Right eye exhibits no discharge. Left eye exhibits no discharge.  Neck: Normal range of motion. Neck supple. No JVD present. No tracheal deviation present.  Cardiovascular: Normal rate, regular rhythm and normal heart sounds.   Pulmonary/Chest: Effort normal and breath sounds normal. No respiratory distress. She has no wheezes. She has no rales.  Abdominal: Soft. She exhibits no mass. There is no tenderness. There is no rebound and no guarding.  Lymphadenopathy:    She has no cervical adenopathy.  Neurological: She is alert and oriented to person, place, and time. She has normal strength. Coordination normal.  Skin: Skin is warm and dry. No rash noted. She is not diaphoretic.  Psychiatric: She has a normal mood and affect. Judgment normal.    ED Course  Procedures (including critical care time) Labs Review Labs Reviewed - No data to display  Imaging Review No results found.   MDM   1. Bronchitis    Treat symptomatically with cough suppressant and Atrovent for the rhinorrhea. Discussed expected course of illness, possibility of a prolonged cough. Return precautions discussed with patient.   Discharge Medication List as of 02/04/2014  8:08 PM    START taking these medications   Details  HYDROcodone-homatropine (HYCODAN) 5-1.5 MG/5ML syrup  Take 5 mLs by mouth every 6 (six) hours as needed for cough., Starting 02/04/2014, Until Discontinued, Print    ipratropium (ATROVENT) 0.06 % nasal spray Place 2 sprays into both nostrils every 4 (four) hours as needed for rhinitis., Starting 02/04/2014, Until Discontinued, Print          Liam Graham, PA-C 02/05/14 [phone]

## 2014-02-04 NOTE — Discharge Instructions (Signed)
Upper Respiratory Infection, Adult An upper respiratory infection (URI) is also sometimes known as the common cold. The upper respiratory tract includes the nose, sinuses, throat, trachea, and bronchi. Bronchi are the airways leading to the lungs. Most people improve within 1 week, but symptoms can last up to 2 weeks. A residual cough may last even longer.  CAUSES Many different viruses can infect the tissues lining the upper respiratory tract. The tissues become irritated and inflamed and often become very moist. Mucus production is also common. A cold is contagious. You can easily spread the virus to others by oral contact. This includes kissing, sharing a glass, coughing, or sneezing. Touching your mouth or nose and then touching a surface, which is then touched by another person, can also spread the virus. SYMPTOMS  Symptoms typically develop 1 to 3 days after you come in contact with a cold virus. Symptoms vary from person to person. They may include:  Runny nose.  Sneezing.  Nasal congestion.  Sinus irritation.  Sore throat.  Loss of voice (laryngitis).  Cough.  Fatigue.  Muscle aches.  Loss of appetite.  Headache.  Low-grade fever. DIAGNOSIS  You might diagnose your own cold based on familiar symptoms, since most people get a cold 2 to 3 times a year. Your caregiver can confirm this based on your exam. Most importantly, your caregiver can check that your symptoms are not due to another disease such as strep throat, sinusitis, pneumonia, asthma, or epiglottitis. Blood tests, throat tests, and X-rays are not necessary to diagnose a common cold, but they may sometimes be helpful in excluding other more serious diseases. Your caregiver will decide if any further tests are required. RISKS AND COMPLICATIONS  You may be at risk for a more severe case of the common cold if you smoke cigarettes, have chronic heart disease (such as heart failure) or lung disease (such as asthma), or if  you have a weakened immune system. The very young and very old are also at risk for more serious infections. Bacterial sinusitis, middle ear infections, and bacterial pneumonia can complicate the common cold. The common cold can worsen asthma and chronic obstructive pulmonary disease (COPD). Sometimes, these complications can require emergency medical care and may be life-threatening. PREVENTION  The best way to protect against getting a cold is to practice good hygiene. Avoid oral or hand contact with people with cold symptoms. Wash your hands often if contact occurs. There is no clear evidence that vitamin C, vitamin E, echinacea, or exercise reduces the chance of developing a cold. However, it is always recommended to get plenty of rest and practice good nutrition. TREATMENT  Treatment is directed at relieving symptoms. There is no cure. Antibiotics are not effective, because the infection is caused by a virus, not by bacteria. Treatment may include:  Increased fluid intake. Sports drinks offer valuable electrolytes, sugars, and fluids.  Breathing heated mist or steam (vaporizer or shower).  Eating chicken soup or other clear broths, and maintaining good nutrition.  Getting plenty of rest.  Using gargles or lozenges for comfort.  Controlling fevers with ibuprofen or acetaminophen as directed by your caregiver.  Increasing usage of your inhaler if you have asthma. Zinc gel and zinc lozenges, taken in the first 24 hours of the common cold, can shorten the duration and lessen the severity of symptoms. Pain medicines may help with fever, muscle aches, and throat pain. A variety of non-prescription medicines are available to treat congestion and runny nose. Your caregiver   can make recommendations and may suggest nasal or lung inhalers for other symptoms.  HOME CARE INSTRUCTIONS   Only take over-the-counter or prescription medicines for pain, discomfort, or fever as directed by your  caregiver.  Use a warm mist humidifier or inhale steam from a shower to increase air moisture. This may keep secretions moist and make it easier to breathe.  Drink enough water and fluids to keep your urine clear or pale yellow.  Rest as needed.  Return to work when your temperature has returned to normal or as your caregiver advises. You may need to stay home longer to avoid infecting others. You can also use a face mask and careful hand washing to prevent spread of the virus. SEEK MEDICAL CARE IF:   After the first few days, you feel you are getting worse rather than better.  You need your caregiver's advice about medicines to control symptoms.  You develop chills, worsening shortness of breath, or brown or red sputum. These may be signs of pneumonia.  You develop yellow or brown nasal discharge or pain in the face, especially when you bend forward. These may be signs of sinusitis.  You develop a fever, swollen neck glands, pain with swallowing, or white areas in the back of your throat. These may be signs of strep throat. SEEK IMMEDIATE MEDICAL CARE IF:   You have a fever.  You develop severe or persistent headache, ear pain, sinus pain, or chest pain.  You develop wheezing, a prolonged cough, cough up blood, or have a change in your usual mucus (if you have chronic lung disease).  You develop sore muscles or a stiff neck. Document Released: 10/03/2000 Document Revised: 07/02/2011 Document Reviewed: 07/15/2013 Exit[employer] Patient Information 2015 [employer]. This information is not intended to replace advice given to you by your health care provider. Make sure you discuss any questions you have with your health care provider.  

## 2014-02-04 NOTE — ED Notes (Signed)
C/o cough, chest congestion, aching all over, headache, hot flashes and diarrhea onset 2 days ago.  Diarrhea x 2 yesterday and one today.

## 2014-02-07 NOTE — ED Provider Notes (Signed)
Medical screening examination/treatment/procedure(s) were performed by resident physician or non-physician practitioner and as supervising physician I was immediately available for consultation/collaboration.  Maryruth Eve, MD     Melony Overly, MD 02/07/14 [phone]

## 2014-08-13 ENCOUNTER — Emergency Department (INDEPENDENT_AMBULATORY_CARE_PROVIDER_SITE_OTHER)
Admission: EM | Admit: 2014-08-13 | Discharge: 2014-08-13 | Disposition: A | Discharge status: Home / Self Care | Payer: Self-pay | Source: Home / Self Care | Attending: Emergency Medicine | Admitting: Emergency Medicine

## 2014-08-13 ENCOUNTER — Emergency Department (INDEPENDENT_AMBULATORY_CARE_PROVIDER_SITE_OTHER): Payer: Self-pay

## 2014-08-13 ENCOUNTER — Encounter (HOSPITAL_COMMUNITY): Payer: Self-pay

## 2014-08-13 DIAGNOSIS — J40 Bronchitis, not specified as acute or chronic: Secondary | ICD-10-CM

## 2014-08-13 MED ORDER — HYDROCODONE-HOMATROPINE 5-1.5 MG/5ML PO SYRP: 5.0000 mL | ORAL_SOLUTION | Freq: Four times a day (QID) | ORAL | Status: DC | PRN

## 2014-08-13 MED ORDER — DOXYCYCLINE HYCLATE 100 MG PO CAPS: 100.0000 mg | ORAL_CAPSULE | Freq: Two times a day (BID) | ORAL | Status: DC

## 2014-08-13 MED ORDER — PREDNISONE 50 MG PO TABS: ORAL_TABLET | ORAL | Status: DC

## 2014-08-13 NOTE — ED Notes (Signed)
C/o onset yesterday of cough, congestion, loose stools. Took care of grandson who had similar syx the other day

## 2014-08-13 NOTE — Discharge Instructions (Signed)
You have bronchitis. Take prednisone and doxycycline as prescribed. Use the Hycodan cough syrup as needed for coughing. Do not drive while taking this medicine. Follow-up as needed.

## 2014-08-13 NOTE — ED Provider Notes (Signed)
CSN: [id number]     Arrival date & time 08/13/14  1155 History   First MD Initiated Contact with Patient 08/13/14 1224     Chief Complaint  Patient presents with  . Cough   (Consider location/radiation/quality/duration/timing/severity/associated sxs/prior Treatment) HPI  She is a 49 year old woman here for evaluation of cough. She states this started yesterday. She reports coughing so hard she will gag. She also reports a headache with the cough. She has a mild runny nose and has had some mild diarrhea. No nasal congestion, sore throat, fevers, shortness of breath, nausea, vomiting. Positive sick contact. She has tried several over-the-counter cough medicines without improvement.  Past Medical History  Diagnosis Date  . History of blood transfusion  2013    heavy periods, MC 2 units transfuesd  . Dysmenorrhea   . SVD (spontaneous vaginal delivery)     x 2  . Hypertension     one time  . HTN (hypertension) 03/12/2012    one time no med  . Heart murmur     "nothing to worry about" dx child, no problems ever  . Anxiety     no meds  . Pneumonia      hx "had it once a year for a few years"  last time was 3- 4 years ago.   Past Surgical History  Procedure Laterality Date  . Orif ankle fracture Right 06/18/2012    Procedure: OPEN REDUCTION INTERNAL FIXATION (ORIF) ANKLE FRACTURE;  Surgeon: Augustin Schooling, MD;  Location: [facility];  Service: Orthopedics;  Laterality: Right;  . Cesarean section  1988    x 1  . Tubal ligation  1990  . Cholecystectomy  1990   Family History  Problem Relation Age of Onset  . Breast cancer Maternal Aunt    History  Substance Use Topics  . Smoking status: Current Every Day Smoker -- 1.00 packs/day for 20 years    Types: Cigarettes  . Smokeless tobacco: Never Used  . Alcohol Use: No   OB History    No data available     Review of Systems  Constitutional: Negative for fever, chills and appetite change.  HENT: Positive for rhinorrhea. Negative for  congestion, ear pain and sore throat.   Respiratory: Positive for cough. Negative for shortness of breath.   Cardiovascular: Negative for chest pain.  Gastrointestinal: Negative for nausea, vomiting, abdominal pain and diarrhea.  Neurological: Positive for headaches (with cough).    Allergies  Bee venom  Home Medications   Prior to Admission medications   Medication Sig Start Date End Date Taking? Authorizing Provider  acetaminophen (TYLENOL) 325 MG tablet Take 325 mg by mouth every 6 (six) hours as needed.    Historical Provider, MD  Aspirin-Acetaminophen (GOODYS BODY PAIN PO) Take 1 packet by mouth every 6 (six) hours as needed (for pain).     Historical Provider, MD  doxycycline (VIBRAMYCIN) 100 MG capsule Take 1 capsule (100 mg total) by mouth 2 (two) times daily. 08/13/14   Melony Overly, MD  EPINEPHrine (EPIPEN 2-PAK) 0.3 mg/0.3 mL IJ SOAJ injection Inject 0.3 mLs (0.3 mg total) into the muscle once as needed (for severe allergic reaction). CAll 911 immediately if you have to use this medicine 12/02/13   Comer Locket, PA-C  HYDROcodone-homatropine [facility]) 5-1.5 MG/5ML syrup Take 5 mLs by mouth every 6 (six) hours as needed for cough. 08/13/14   Melony Overly, MD  predniSONE (DELTASONE) 50 MG tablet Take 1 pill daily for 5 days.  08/13/14   Melony Overly, MD  pseudoephedrine-acetaminophen (TYLENOL SINUS) 30-500 MG TABS Take 1 tablet by mouth every 4 (four) hours as needed.    Historical Provider, MD  Pseudoephedrine-APAP-DM (DAYQUIL PO) Take by mouth.    Historical Provider, MD   BP 104/69 mmHg  Pulse 89  Temp(Src) 98.7 F (37.1 C) (Oral)  Resp 16  SpO2 99%  LMP 08/13/2014 Physical Exam  Constitutional: She is oriented to person, place, and time. She appears well-developed and well-nourished. No distress.  HENT:  Nose: Nose normal.  Mouth/Throat: Oropharynx is clear and moist. No oropharyngeal exudate.  Eyes: Conjunctivae are normal.  Neck: Neck supple.  Cardiovascular: Normal  rate, regular rhythm and normal heart sounds.   No murmur heard. Pulmonary/Chest: Effort normal and breath sounds normal. No respiratory distress. She has no wheezes. She has no rales.  Lymphadenopathy:    She has no cervical adenopathy.  Neurological: She is alert and oriented to person, place, and time.    ED Course  Procedures (including critical care time) Labs Review Labs Reviewed - No data to display  Imaging Review Dg Chest 2 View  08/13/2014   CLINICAL DATA:  Cough and runny nose  EXAM: CHEST  2 VIEW  COMPARISON:  03/11/2014  FINDINGS: The heart size and mediastinal contours are within normal limits. Both lungs are clear. The visualized skeletal structures are unremarkable.  IMPRESSION: No active cardiopulmonary disease.   Electronically Signed   By: Inez Catalina M.D.   On: 08/13/2014 13:39     MDM   1. Bronchitis    We'll treat with prednisone and doxycycline. Hycodan for cough. Follow-up as needed.    Melony Overly, MD 08/13/14 1357

## 2014-08-19 ENCOUNTER — Emergency Department (HOSPITAL_COMMUNITY)
Admission: EM | Admit: 2014-08-19 | Discharge: 2014-08-19 | Disposition: A | Discharge status: Home / Self Care | Payer: Self-pay | Attending: Emergency Medicine | Admitting: Emergency Medicine

## 2014-08-19 ENCOUNTER — Emergency Department (HOSPITAL_COMMUNITY): Payer: Self-pay

## 2014-08-19 ENCOUNTER — Encounter (HOSPITAL_COMMUNITY): Payer: Self-pay | Admitting: Emergency Medicine

## 2014-08-19 DIAGNOSIS — Z7982 Long term (current) use of aspirin: Secondary | ICD-10-CM | POA: Insufficient documentation

## 2014-08-19 DIAGNOSIS — I10 Essential (primary) hypertension: Secondary | ICD-10-CM | POA: Insufficient documentation

## 2014-08-19 DIAGNOSIS — R52 Pain, unspecified: Secondary | ICD-10-CM | POA: Insufficient documentation

## 2014-08-19 DIAGNOSIS — R011 Cardiac murmur, unspecified: Secondary | ICD-10-CM | POA: Insufficient documentation

## 2014-08-19 DIAGNOSIS — J209 Acute bronchitis, unspecified: Secondary | ICD-10-CM | POA: Insufficient documentation

## 2014-08-19 DIAGNOSIS — Z8701 Personal history of pneumonia (recurrent): Secondary | ICD-10-CM | POA: Insufficient documentation

## 2014-08-19 DIAGNOSIS — Z8781 Personal history of (healed) traumatic fracture: Secondary | ICD-10-CM | POA: Insufficient documentation

## 2014-08-19 DIAGNOSIS — Z79899 Other long term (current) drug therapy: Secondary | ICD-10-CM | POA: Insufficient documentation

## 2014-08-19 DIAGNOSIS — J40 Bronchitis, not specified as acute or chronic: Secondary | ICD-10-CM

## 2014-08-19 DIAGNOSIS — F419 Anxiety disorder, unspecified: Secondary | ICD-10-CM | POA: Insufficient documentation

## 2014-08-19 DIAGNOSIS — Z720 Tobacco use: Secondary | ICD-10-CM | POA: Insufficient documentation

## 2014-08-19 DIAGNOSIS — Z792 Long term (current) use of antibiotics: Secondary | ICD-10-CM | POA: Insufficient documentation

## 2014-08-19 LAB — CBC WITH DIFFERENTIAL/PLATELET
Basophils Absolute: 0 10*3/uL (ref 0.0–0.1)
Basophils Relative: 0 % (ref 0–1)
Eosinophils Absolute: 0 10*3/uL (ref 0.0–0.7)
Eosinophils Relative: 0 % (ref 0–5)
HCT: 32.6 % — ABNORMAL LOW (ref 36.0–46.0)
Hemoglobin: 10.1 g/dL — ABNORMAL LOW (ref 12.0–15.0)
Lymphocytes Relative: 10 % — ABNORMAL LOW (ref 12–46)
Lymphs Abs: 1.7 10*3/uL (ref 0.7–4.0)
MCH: 23.7 pg — ABNORMAL LOW (ref 26.0–34.0)
MCHC: 31 g/dL (ref 30.0–36.0)
MCV: 76.5 fL — ABNORMAL LOW (ref 78.0–100.0)
Monocytes Absolute: 1.6 10*3/uL — ABNORMAL HIGH (ref 0.1–1.0)
Monocytes Relative: 9 % (ref 3–12)
Neutro Abs: 13.8 10*3/uL — ABNORMAL HIGH (ref 1.7–7.7)
Neutrophils Relative %: 81 % — ABNORMAL HIGH (ref 43–77)
Platelets: 247 10*3/uL (ref 150–400)
RBC: 4.26 MIL/uL (ref 3.87–5.11)
RDW: 17.7 % — ABNORMAL HIGH (ref 11.5–15.5)
WBC: 17.1 10*3/uL — ABNORMAL HIGH (ref 4.0–10.5)

## 2014-08-19 LAB — URINALYSIS, ROUTINE W REFLEX MICROSCOPIC
Bilirubin Urine: NEGATIVE
Glucose, UA: NEGATIVE mg/dL
Ketones, ur: NEGATIVE mg/dL
Nitrite: NEGATIVE
Protein, ur: NEGATIVE mg/dL
Specific Gravity, Urine: 1.01 (ref 1.005–1.030)
Urobilinogen, UA: 0.2 mg/dL (ref 0.0–1.0)
pH: 7.5 (ref 5.0–8.0)

## 2014-08-19 LAB — BASIC METABOLIC PANEL
Anion gap: 9 (ref 5–15)
BUN: 8 mg/dL (ref 6–23)
CO2: 28 mmol/L (ref 19–32)
Calcium: 9.4 mg/dL (ref 8.4–10.5)
Chloride: 96 mmol/L (ref 96–112)
Creatinine, Ser: 1.02 mg/dL (ref 0.50–1.10)
GFR calc Af Amer: 75 mL/min — ABNORMAL LOW (ref >90)
GFR calc non Af Amer: 64 mL/min — ABNORMAL LOW (ref >90)
Glucose, Bld: 133 mg/dL — ABNORMAL HIGH (ref 70–99)
Potassium: 4 mmol/L (ref 3.5–5.1)
Sodium: 133 mmol/L — ABNORMAL LOW (ref 135–145)

## 2014-08-19 LAB — URINE MICROSCOPIC-ADD ON

## 2014-08-19 MED ORDER — PROMETHAZINE-DM 6.25-15 MG/5ML PO SYRP: 5.0000 mL | ORAL_SOLUTION | Freq: Four times a day (QID) | ORAL | Status: DC | PRN

## 2014-08-19 MED ORDER — AEROCHAMBER PLUS FLO-VU MEDIUM MISC
1.0000 | Freq: Once | Status: AC
  Administered 2014-08-19: 1
  Filled 2014-08-19: qty 1

## 2014-08-19 MED ORDER — LEVOFLOXACIN 750 MG PO TABS: 750.0000 mg | ORAL_TABLET | Freq: Every day | ORAL | Status: DC

## 2014-08-19 MED ORDER — SODIUM CHLORIDE 0.9 % IV BOLUS (SEPSIS)
1000.0000 mL | Freq: Once | INTRAVENOUS | Status: AC
  Administered 2014-08-19: 1000 mL via INTRAVENOUS

## 2014-08-19 MED ORDER — PREDNISONE 50 MG PO TABS: 50.0000 mg | ORAL_TABLET | Freq: Every day | ORAL | Status: DC

## 2014-08-19 MED ORDER — MORPHINE SULFATE 4 MG/ML IJ SOLN
4.0000 mg | Freq: Once | INTRAMUSCULAR | Status: AC
  Administered 2014-08-19: 4 mg via INTRAVENOUS
  Filled 2014-08-19: qty 1

## 2014-08-19 MED ORDER — PREDNISONE 20 MG PO TABS
60.0000 mg | ORAL_TABLET | Freq: Once | ORAL | Status: AC
  Administered 2014-08-19: 60 mg via ORAL
  Filled 2014-08-19: qty 3

## 2014-08-19 MED ORDER — ALBUTEROL SULFATE HFA 108 (90 BASE) MCG/ACT IN AERS
2.0000 | INHALATION_SPRAY | RESPIRATORY_TRACT | Status: DC | PRN
  Administered 2014-08-19: 2 via RESPIRATORY_TRACT
  Filled 2014-08-19: qty 6.7

## 2014-08-19 MED ORDER — FENTANYL CITRATE (PF) 100 MCG/2ML IJ SOLN
100.0000 ug | Freq: Once | INTRAMUSCULAR | Status: AC
Start: 2014-08-19 — End: 2014-08-19
  Administered 2014-08-19: 100 ug via INTRAVENOUS
  Filled 2014-08-19: qty 2

## 2014-08-19 MED ORDER — GUAIFENESIN ER 1200 MG PO TB12: 1.0000 | ORAL_TABLET | Freq: Two times a day (BID) | ORAL | Status: DC

## 2014-08-19 MED ORDER — IPRATROPIUM-ALBUTEROL 0.5-2.5 (3) MG/3ML IN SOLN
3.0000 mL | Freq: Once | RESPIRATORY_TRACT | Status: AC
  Administered 2014-08-19: 3 mL via RESPIRATORY_TRACT
  Filled 2014-08-19: qty 3

## 2014-08-19 NOTE — ED Notes (Signed)
Patient transported to X-ray 

## 2014-08-19 NOTE — Discharge Instructions (Signed)
Return here as needed.  Follow-up with your primary care Dr. increase your fluid intake, rest as much as possible

## 2014-08-19 NOTE — ED Notes (Addendum)
Pt eas seen at [facility] and told she has bronchitis a week ago. Symptoms of body aches, coughing up phlegm, chest pain, shortness of breath worsening. Chest pain worse with deep breathing and coughing. Pt bought her own mask as to not make children sick.

## 2014-08-19 NOTE — ED Provider Notes (Signed)
CSN: [id number]     Arrival date & time 08/19/14  1014 History   First MD Initiated Contact with Patient 08/19/14 1022     Chief Complaint  Patient presents with  . Generalized Body Aches  . Cough  . Chest Pain  . Shortness of Breath     (Consider location/radiation/quality/duration/timing/severity/associated sxs/prior Treatment) HPI   Kimberly Jefferson is a 49 yo female who presents to the ED with worsening cough and chest wall pain for two days. She says she went to urgent care last week with cough and runny nose and was diagnosed with bronchitis. She has been taking the Doxycycline and hycodan syrup they prescribed, but feels worse now. She says her cough is productive of yellow-green phlegm. She has left sided chest pain that wraps around to her left back. She describes this as "when I breathe it's like my bones pop" and has worsened the last two days. She says it is worse with coughing and deep breathing. She has taken Aleve for the chest wall pain and says it helped some. She endorses subjective fever, chills, fatigue, generalized weakness, nausea and body aches. She also says her hands, feet and face were swollen and puffy yesterday, but had resolved when she woke up this morning. She denies SOB, dizziness, syncope, unexplained weight loss, abdominal pain, vomiting, diarrhea, constipation or urinary symptoms.   Past Medical History  Diagnosis Date  . History of blood transfusion  2013    heavy periods, MC 2 units transfuesd  . Dysmenorrhea   . SVD (spontaneous vaginal delivery)     x 2  . Hypertension     one time  . HTN (hypertension) 03/12/2012    one time no med  . Heart murmur     "nothing to worry about" dx child, no problems ever  . Anxiety     no meds  . Pneumonia      hx "had it once a year for a few years"  last time was 3- 4 years ago.   Past Surgical History  Procedure Laterality Date  . Orif ankle fracture Right 06/18/2012    Procedure: OPEN REDUCTION INTERNAL  FIXATION (ORIF) ANKLE FRACTURE;  Surgeon: Augustin Schooling, MD;  Location: [place];  Service: Orthopedics;  Laterality: Right;  . Cesarean section  1988    x 1  . Tubal ligation  1990  . Cholecystectomy  1990   Family History  Problem Relation Age of Onset  . Breast cancer Maternal Aunt    History  Substance Use Topics  . Smoking status: Current Every Day Smoker -- 1.00 packs/day for 20 years    Types: Cigarettes  . Smokeless tobacco: Never Used  . Alcohol Use: No   OB History    No data available     Review of Systems All other systems negative except as documented in the HPI. All pertinent positives and negatives as reviewed in the HPI.   Allergies  Bee venom  Home Medications   Prior to Admission medications   Medication Sig Start Date End Date Taking? Authorizing Provider  acetaminophen (TYLENOL) 325 MG tablet Take 325 mg by mouth every 6 (six) hours as needed.    Historical Provider, MD  Aspirin-Acetaminophen (GOODYS BODY PAIN PO) Take 1 packet by mouth every 6 (six) hours as needed (for pain).     Historical Provider, MD  doxycycline (VIBRAMYCIN) 100 MG capsule Take 1 capsule (100 mg total) by mouth 2 (two) times daily. 08/13/14  Melony Overly, MD  EPINEPHrine (EPIPEN 2-PAK) 0.3 mg/0.3 mL IJ SOAJ injection Inject 0.3 mLs (0.3 mg total) into the muscle once as needed (for severe allergic reaction). CAll 911 immediately if you have to use this medicine 12/02/13   Comer Locket, PA-C  HYDROcodone-homatropine [facility]) 5-1.5 MG/5ML syrup Take 5 mLs by mouth every 6 (six) hours as needed for cough. 08/13/14   Melony Overly, MD  predniSONE (DELTASONE) 50 MG tablet Take 1 pill daily for 5 days. 08/13/14   Melony Overly, MD  pseudoephedrine-acetaminophen (TYLENOL SINUS) 30-500 MG TABS Take 1 tablet by mouth every 4 (four) hours as needed.    Historical Provider, MD  Pseudoephedrine-APAP-DM (DAYQUIL PO) Take by mouth.    Historical Provider, MD   BP 130/78 mmHg  Pulse 78  Temp(Src)  100.3 F (37.9 C) (Oral)  Resp 27  Ht 5\' 7"  (1.702 m)  Wt 155 lb (70.308 kg)  BMI 24.27 kg/m2  SpO2 97%  LMP 08/13/2014 Physical Exam  Constitutional: She is oriented to person, place, and time. She appears well-developed and well-nourished.  HENT:  Head: Normocephalic and atraumatic.  Mouth/Throat: Oropharynx is clear and moist and mucous membranes are normal.  Eyes: Conjunctivae are normal. Pupils are equal, round, and reactive to light.  Neck: Normal range of motion. Neck supple. No thyromegaly present.  Cardiovascular: Normal rate, regular rhythm and normal heart sounds.   Pulmonary/Chest: Effort normal and breath sounds normal.  Tenderness to palpation of the L anterior chest and L upper back  Abdominal: Soft. Normal appearance and bowel sounds are normal. There is no hepatosplenomegaly. There is no tenderness. There is no rigidity and no guarding.  Musculoskeletal: She exhibits no edema.  Neurological: She is alert and oriented to person, place, and time. She exhibits normal muscle tone. Coordination normal.  Skin: Skin is warm, dry and intact. No rash noted. No erythema.  Psychiatric: She has a normal mood and affect. Her behavior is normal.  Nursing note and vitals reviewed.   ED Course  Procedures (including critical care time) Labs Review Labs Reviewed  CBC WITH DIFFERENTIAL/PLATELET  BASIC METABOLIC PANEL  URINALYSIS, ROUTINE W REFLEX MICROSCOPIC    Imaging Review No results found.   EKG Interpretation   Date/Time:  Thursday August 19 2014 10:22:37 EDT Ventricular Rate:  83 PR Interval:  128 QRS Duration: 76 QT Interval:  336 QTC Calculation: 395 R Axis:   72 Text Interpretation:  Sinus arrhythmia Minimal ST depression,  anterolateral leads Confirmed by Hazle Coca [phone]) on 08/19/2014 10:26:30  AM      Patient is feeling better following nebulized treatment and IV fluids.  She is advised to follow with her primary care Dr. told to return here as needed.   Patient will be treated for a bronchitis with antibiotics, prednisone and an inhaler.  She is a smoker, and that is what we are using antibiotics for her condition.  Told to return here as needed.  He has had these symptoms over the last couple of weeks.  She is maintaining good oxygen saturations and was ambulated without difficulty and maintained good pulse oximetry   Dalia Heading, PA-C 08/20/14 [place], MD 08/20/14 [phone]

## 2014-08-19 NOTE — ED Notes (Signed)
Patient returned from xray.

## 2015-04-25 ENCOUNTER — Encounter (HOSPITAL_COMMUNITY): Payer: Self-pay

## 2015-04-25 ENCOUNTER — Emergency Department (INDEPENDENT_AMBULATORY_CARE_PROVIDER_SITE_OTHER)
Admission: EM | Admit: 2015-04-25 | Discharge: 2015-04-25 | Disposition: A | Discharge status: Home / Self Care | Payer: Self-pay | Source: Home / Self Care | Attending: Family Medicine | Admitting: Family Medicine

## 2015-04-25 ENCOUNTER — Emergency Department (INDEPENDENT_AMBULATORY_CARE_PROVIDER_SITE_OTHER): Payer: Self-pay

## 2015-04-25 DIAGNOSIS — R110 Nausea: Secondary | ICD-10-CM

## 2015-04-25 DIAGNOSIS — J111 Influenza due to unidentified influenza virus with other respiratory manifestations: Secondary | ICD-10-CM

## 2015-04-25 DIAGNOSIS — J9801 Acute bronchospasm: Secondary | ICD-10-CM

## 2015-04-25 MED ORDER — KETOROLAC TROMETHAMINE 60 MG/2ML IM SOLN
INTRAMUSCULAR | Status: AC
  Filled 2015-04-25: qty 2

## 2015-04-25 MED ORDER — ALBUTEROL SULFATE HFA 108 (90 BASE) MCG/ACT IN AERS: 2.0000 | INHALATION_SPRAY | RESPIRATORY_TRACT | Status: DC | PRN

## 2015-04-25 MED ORDER — ONDANSETRON HCL 4 MG PO TABS: 4.0000 mg | ORAL_TABLET | Freq: Four times a day (QID) | ORAL | Status: DC

## 2015-04-25 MED ORDER — ONDANSETRON 4 MG PO TBDP
ORAL_TABLET | ORAL | Status: AC
  Filled 2015-04-25: qty 1

## 2015-04-25 MED ORDER — KETOROLAC TROMETHAMINE 60 MG/2ML IM SOLN
60.0000 mg | Freq: Once | INTRAMUSCULAR | Status: AC
Start: 2015-04-25 — End: 2015-04-25
  Administered 2015-04-25: 60 mg via INTRAMUSCULAR

## 2015-04-25 MED ORDER — ONDANSETRON 4 MG PO TBDP
4.0000 mg | ORAL_TABLET | Freq: Once | ORAL | Status: AC
  Administered 2015-04-25: 4 mg via ORAL

## 2015-04-25 NOTE — ED Provider Notes (Signed)
CSN: [id number]     Arrival date & time 04/25/15  1700 History   First MD Initiated Contact with Patient 04/25/15 1927     Chief Complaint  Patient presents with  . Generalized Body Aches   (Consider location/radiation/quality/duration/timing/severity/associated sxs/prior Treatment) HPI Comments: 50 year old female complaining of a four-day history of cough, fatigue and malaise, bones hurting, myalgias, nausea but no vomiting. Also complaining of a sore throat and shortness of breath with cough. She is a smoker. She denies earache or chest pain. She has had a fever at home and is currently 100.1. She did not attempt to receive a flu shot earlier this year. Nothing makes symptoms worse, nothing makes them better. Has tried albuterol from an old canister with modest improvement.   Past Medical History  Diagnosis Date  . History of blood transfusion  2013    heavy periods, MC 2 units transfuesd  . Dysmenorrhea   . SVD (spontaneous vaginal delivery)     x 2  . Hypertension     one time  . HTN (hypertension) 03/12/2012    one time no med  . Heart murmur     "nothing to worry about" dx child, no problems ever  . Anxiety     no meds  . Pneumonia      hx "had it once a year for a few years"  last time was 3- 4 years ago.   Past Surgical History  Procedure Laterality Date  . Orif ankle fracture Right 06/18/2012    Procedure: OPEN REDUCTION INTERNAL FIXATION (ORIF) ANKLE FRACTURE;  Surgeon: Augustin Schooling, MD;  Location: [place];  Service: Orthopedics;  Laterality: Right;  . Cesarean section  1988    x 1  . Tubal ligation  1990  . Cholecystectomy  1990   Family History  Problem Relation Age of Onset  . Breast cancer Maternal Aunt    Social History  Substance Use Topics  . Smoking status: Current Every Day Smoker -- 1.00 packs/day for 20 years    Types: Cigarettes  . Smokeless tobacco: Never Used  . Alcohol Use: No   OB History    No data available     Review of Systems   Constitutional: Positive for fever, chills, activity change, appetite change and fatigue.  HENT: Positive for congestion, postnasal drip, rhinorrhea and sore throat. Negative for ear pain.   Eyes: Negative.   Respiratory: Positive for cough and shortness of breath.   Cardiovascular: Negative for chest pain.  Gastrointestinal: Positive for nausea. Negative for vomiting and abdominal pain.  Genitourinary: Negative.   Musculoskeletal: Positive for myalgias and back pain.  Skin: Negative for rash.  Neurological: Positive for headaches. Negative for facial asymmetry and speech difficulty.    Allergies  Bee venom and Doxycycline  Home Medications   Prior to Admission medications   Medication Sig Start Date End Date Taking? Authorizing Provider  acetaminophen (TYLENOL) 325 MG tablet Take 325 mg by mouth every 6 (six) hours as needed.    Historical Provider, MD  albuterol (PROVENTIL HFA;VENTOLIN HFA) 108 (90 Base) MCG/ACT inhaler Inhale 2 puffs into the lungs every 4 (four) hours as needed for wheezing or shortness of breath. 04/25/15   Janne Napoleon, NP  Aspirin-Acetaminophen (GOODYS BODY PAIN PO) Take 1 packet by mouth every 6 (six) hours as needed (for pain).     Historical Provider, MD  doxycycline (VIBRAMYCIN) 100 MG capsule Take 1 capsule (100 mg total) by mouth 2 (two) times daily. 08/13/14  Melony Overly, MD  EPINEPHrine (EPIPEN 2-PAK) 0.3 mg/0.3 mL IJ SOAJ injection Inject 0.3 mLs (0.3 mg total) into the muscle once as needed (for severe allergic reaction). CAll 911 immediately if you have to use this medicine 12/02/13   Comer Locket, PA-C  Guaifenesin 1200 MG TB12 Take 1 tablet (1,200 mg total) by mouth 2 (two) times daily. 08/19/14   Christopher Lawyer, PA-C  ondansetron (ZOFRAN) 4 MG tablet Take 1 tablet (4 mg total) by mouth every 6 (six) hours. 04/25/15   Janne Napoleon, NP   Meds Ordered and Administered this Visit   Medications  ondansetron (ZOFRAN-ODT) disintegrating tablet 4 mg (4 mg  Oral Given 04/25/15 1948)  ketorolac (TORADOL) injection 60 mg (60 mg Intramuscular Given 04/25/15 1948)    BP 147/90 mmHg  Pulse 106  Temp(Src) 100.1 F (37.8 C) (Oral)  Resp 20  SpO2 100%  LMP 03/25/2015 No data found.   Physical Exam  Constitutional: She is oriented to person, place, and time. She appears well-developed and well-nourished. No distress.  Appears moderately ill but nontoxic.  HENT:  Bilateral TMs are normal Oropharynx with minor erythema and clear PND. No exudates  Eyes: Conjunctivae and EOM are normal.  Neck: Normal range of motion. Neck supple.  Cardiovascular: Regular rhythm, normal heart sounds and intact distal pulses.   Pulmonary/Chest: Effort normal.  Mildly diminished breath sounds bilaterally. Rare expiratory wheeze.  Abdominal: Soft. There is no tenderness. There is no rebound.  Musculoskeletal: She exhibits tenderness.  Lymphadenopathy:    She has no cervical adenopathy.  Neurological: She is alert and oriented to person, place, and time. She exhibits normal muscle tone.  Skin: Skin is warm and dry.  Nursing note and vitals reviewed.   ED Course  Procedures (including critical care time)  Labs Review Labs Reviewed - No data to display  Imaging Review Dg Chest 2 View  04/25/2015  CLINICAL DATA:  Cough.  Fever.  Chills.  Headache.  Body aches. EXAM: CHEST  2 VIEW COMPARISON:  08/19/2014 FINDINGS: The IMPRESSION: No active cardiopulmonary disease. Electronically Signed   By: Van Clines M.D.   On: 04/25/2015 20:14     Visual Acuity Review  Right Eye Distance:   Left Eye Distance:   Bilateral Distance:    Right Eye Near:   Left Eye Near:    Bilateral Near:         MDM   1. Influenza   2. Bronchospasm   3. Nausea    Rx for albuterol HFA as directed Zofran 4 mg by mouth. One dose here. Tylenol every 4 hours when necessary Over-the-counter medicine as needed for nasal congestion and drainage. Stop smoking. Drink plenty of  fluids and stay well-hydrated. For worsening new symptoms or problems may return or go to the urgent department. Otherwise follow-up with your PCP.    Janne Napoleon, NP 04/25/15 2031

## 2015-04-25 NOTE — ED Notes (Signed)
Patient complains of aches.fever chills and cough that started about Four days ago Patient states she is not getting any better and feels worse today OTC medications are not helping

## 2015-04-25 NOTE — Discharge Instructions (Signed)
Bronchospasm, Adult A bronchospasm is when the tubes that carry air in and out of your lungs (airways) spasm or tighten. During a bronchospasm it is hard to breathe. This is because the airways get smaller. A bronchospasm can be triggered by:  Allergies. These may be to animals, pollen, food, or mold.  Infection. This is a common cause of bronchospasm.  Exercise.  Irritants. These include pollution, cigarette smoke, strong odors, aerosol sprays, and paint fumes.  Weather changes.  Stress.  Being emotional. HOME CARE   Always have a plan for getting help. Know when to call your doctor and local emergency services (911 in the [place] Know where you can get emergency care.  Only take medicines as told by your doctor.  If you were prescribed an inhaler or nebulizer machine, ask your doctor how to use it correctly. Always use a spacer with your inhaler if you were given one.  Stay calm during an attack. Try to relax and breathe more slowly.  Control your home environment:  Change your heating and air conditioning filter at least once a month.  Limit your use of fireplaces and wood stoves.  Do not  smoke. Do not  allow smoking in your home.  Avoid perfumes and fragrances.  Get rid of pests (such as roaches and mice) and their droppings.  Throw away plants if you see mold on them.  Keep your house clean and dust free.  Replace carpet with wood, tile, or vinyl flooring. Carpet can trap dander and dust.  Use allergy-proof pillows, mattress covers, and box spring covers.  Wash bed sheets and blankets every week in hot water. Dry them in a dryer.  Use blankets that are made of polyester or cotton.  Wash hands frequently. GET HELP IF:  You have muscle aches.  You have chest pain.  The thick spit you spit or cough up (sputum) changes from clear or white to yellow, green, gray, or bloody.  The thick spit you spit or cough up gets thicker.  There are problems that may be  related to the medicine you are given such as:  A rash.  Itching.  Swelling.  Trouble breathing. GET HELP RIGHT AWAY IF:  You feel you cannot breathe or catch your breath.  You cannot stop coughing.  Your treatment is not helping you breathe better.  You have very bad chest pain. MAKE SURE YOU:   Understand these instructions.  Will watch your condition.  Will get help right away if you are not doing well or get worse.   This information is not intended to replace advice given to you by your health care provider. Make sure you discuss any questions you have with your health care provider.   Document Released: 02/04/2009 Document Revised: 04/30/2014 Document Reviewed: 09/30/2012 Elsevier Interactive Patient Education 2016 [employer].  How to Use an Inhaler Using your inhaler correctly is very important. Good technique will make sure that the medicine reaches your lungs.  HOW TO USE AN INHALER:  Take the cap off the inhaler.  If this is the first time using your inhaler, you need to prime it. Shake the inhaler for 5 seconds. Release four puffs into the air, away from your face. Ask your doctor for help if you have questions.  Shake the inhaler for 5 seconds.  Turn the inhaler so the bottle is above the mouthpiece.  Put your pointer finger on top of the bottle. Your thumb holds the bottom of the inhaler.  Open your mouth.  Either hold the inhaler away from your mouth (the width of 2 fingers) or place your lips tightly around the mouthpiece. Ask your doctor which way to use your inhaler.  Breathe out as much air as possible.  Breathe in and push down on the bottle 1 time to release the medicine. You will feel the medicine go in your mouth and throat.  Continue to take a deep breath in very slowly. Try to fill your lungs.  After you have breathed in completely, hold your breath for 10 seconds. This will help the medicine to settle in your lungs. If you cannot hold  your breath for 10 seconds, hold it for as long as you can before you breathe out.  Breathe out slowly, through pursed lips. Whistling is an example of pursed lips.  If your doctor has told you to take more than 1 puff, wait at least 15-30 seconds between puffs. This will help you get the best results from your medicine. Do not use the inhaler more than your doctor tells you to.  Put the cap back on the inhaler.  Follow the directions from your doctor or from the inhaler package about cleaning the inhaler. If you use more than one inhaler, ask your doctor which inhalers to use and what order to use them in. Ask your doctor to help you figure out when you will need to refill your inhaler.  If you use a steroid inhaler, always rinse your mouth with water after your last puff, gargle and spit out the water. Do not swallow the water. GET HELP IF:  The inhaler medicine only partially helps to stop wheezing or shortness of breath.  You are having trouble using your inhaler.  You have some increase in thick spit (phlegm). GET HELP RIGHT AWAY IF:  The inhaler medicine does not help your wheezing or shortness of breath or you have tightness in your chest.  You have dizziness, headaches, or fast heart rate.  You have chills, fever, or night sweats.  You have a large increase of thick spit, or your thick spit is bloody. MAKE SURE YOU:   Understand these instructions.  Will watch your condition.  Will get help right away if you are not doing well or get worse.   This information is not intended to replace advice given to you by your health care provider. Make sure you discuss any questions you have with your health care provider.   Document Released: 01/17/2008 Document Revised: 01/28/2013 Document Reviewed: 11/06/2012 Elsevier Interactive Patient Education 2016 [employer].  Influenza, Adult Influenza (flu) is an infection in the mouth, nose, and throat (respiratory tract) caused by a  virus. The flu can make you feel very ill. Influenza spreads easily from person to person (contagious).  HOME CARE   Only take medicines as told by your doctor.  Use a cool mist humidifier to make breathing easier.  Get plenty of rest until your fever goes away. This usually takes 3 to 4 days.  Drink enough fluids to keep your pee (urine) clear or pale yellow.  Cover your mouth and nose when you cough or sneeze.  Wash your hands well to avoid spreading the flu.  Stay home from work or school until your fever has been gone for at least 1 full day.  Get a flu shot every year. GET HELP RIGHT AWAY IF:   You have trouble breathing or feel short of breath.  Your skin or nails  turn blue.  You have severe neck pain or stiffness.  You have a severe headache, facial pain, or earache.  Your fever gets worse or keeps coming back.  You feel sick to your stomach (nauseous), throw up (vomit), or have watery poop (diarrhea).  You have chest pain.  You have a deep cough that gets worse, or you cough up more thick spit (mucus). MAKE SURE YOU:   Understand these instructions.  Will watch your condition.  Will get help right away if you are not doing well or get worse.   This information is not intended to replace advice given to you by your health care provider. Make sure you discuss any questions you have with your health care provider.   Document Released: 01/17/2008 Document Revised: 04/30/2014 Document Reviewed: 07/09/2011 Elsevier Interactive Patient Education 2016 [employer]  Nausea and Vomiting Nausea is a sick feeling that often comes before throwing up (vomiting). Vomiting is a reflex where stomach contents come out of your mouth. Vomiting can cause severe loss of body fluids (dehydration). Children and elderly adults can become dehydrated quickly, especially if they also have diarrhea. Nausea and vomiting are symptoms of a condition or disease. It is important to find the  cause of your symptoms. CAUSES   Direct irritation of the stomach lining. This irritation can result from increased acid production (gastroesophageal reflux disease), infection, food poisoning, taking certain medicines (such as nonsteroidal anti-inflammatory drugs), alcohol use, or tobacco use.  Signals from the brain.These signals could be caused by a headache, heat exposure, an inner ear disturbance, increased pressure in the brain from injury, infection, a tumor, or a concussion, pain, emotional stimulus, or metabolic problems.  An obstruction in the gastrointestinal tract (bowel obstruction).  Illnesses such as diabetes, hepatitis, gallbladder problems, appendicitis, kidney problems, cancer, sepsis, atypical symptoms of a heart attack, or eating disorders.  Medical treatments such as chemotherapy and radiation.  Receiving medicine that makes you sleep (general anesthetic) during surgery. DIAGNOSIS Your caregiver may ask for tests to be done if the problems do not improve after a few days. Tests may also be done if symptoms are severe or if the reason for the nausea and vomiting is not clear. Tests may include:  Urine tests.  Blood tests.  Stool tests.  Cultures (to look for evidence of infection).  X-rays or other imaging studies. Test results can help your caregiver make decisions about treatment or the need for additional tests. TREATMENT You need to stay well hydrated. Drink frequently but in small amounts.You may wish to drink water, sports drinks, clear broth, or eat frozen ice pops or gelatin dessert to help stay hydrated.When you eat, eating slowly may help prevent nausea.There are also some antinausea medicines that may help prevent nausea. HOME CARE INSTRUCTIONS   Take all medicine as directed by your caregiver.  If you do not have an appetite, do not force yourself to eat. However, you must continue to drink fluids.  If you have an appetite, eat a normal diet  unless your caregiver tells you differently.  Eat a variety of complex carbohydrates (rice, wheat, potatoes, bread), lean meats, yogurt, fruits, and vegetables.  Avoid high-fat foods because they are more difficult to digest.  Drink enough water and fluids to keep your urine clear or pale yellow.  If you are dehydrated, ask your caregiver for specific rehydration instructions. Signs of dehydration may include:  Severe thirst.  Dry lips and mouth.  Dizziness.  Dark urine.  Decreasing urine  frequency and amount.  Confusion.  Rapid breathing or pulse. SEEK IMMEDIATE MEDICAL CARE IF:   You have blood or brown flecks (like coffee grounds) in your vomit.  You have black or bloody stools.  You have a severe headache or stiff neck.  You are confused.  You have severe abdominal pain.  You have chest pain or trouble breathing.  You do not urinate at least once every 8 hours.  You develop cold or clammy skin.  You continue to vomit for longer than 24 to 48 hours.  You have a fever. MAKE SURE YOU:   Understand these instructions.  Will watch your condition.  Will get help right away if you are not doing well or get worse.   This information is not intended to replace advice given to you by your health care provider. Make sure you discuss any questions you have with your health care provider.   Document Released: 04/09/2005 Document Revised: 07/02/2011 Document Reviewed: 09/06/2010 Elsevier Interactive Patient Education 2016 Else[employer] Inc.  Upper Respiratory Infection, Adult Most upper respiratory infections (URIs) are caused by a virus. A URI affects the nose, throat, and upper air passages. The most common type of URI is often called "the common cold." HOME CARE   Take medicines only as told by your doctor.  Gargle warm saltwater or take cough drops to comfort your throat as told by your doctor.  Use a warm mist humidifier or inhale steam from a shower to  increase air moisture. This may make it easier to breathe.  Drink enough fluid to keep your pee (urine) clear or pale yellow.  Eat soups and other clear broths.  Have a healthy diet.  Rest as needed.  Go back to work when your fever is gone or your doctor says it is okay.  You may need to stay home longer to avoid giving your URI to others.  You can also wear a face mask and wash your hands often to prevent spread of the virus.  Use your inhaler more if you have asthma.  Do not use any tobacco products, including cigarettes, chewing tobacco, or electronic cigarettes. If you need help quitting, ask your doctor. GET HELP IF:  You are getting worse, not better.  Your symptoms are not helped by medicine.  You have chills.  You are getting more short of breath.  You have brown or red mucus.  You have yellow or brown discharge from your nose.  You have pain in your face, especially when you bend forward.  You have a fever.  You have puffy (swollen) neck glands.  You have pain while swallowing.  You have white areas in the back of your throat. GET HELP RIGHT AWAY IF:   You have very bad or constant:  Headache.  Ear pain.  Pain in your forehead, behind your eyes, and over your cheekbones (sinus pain).  Chest pain.  You have long-lasting (chronic) lung disease and any of the following:  Wheezing.  Long-lasting cough.  Coughing up blood.  A change in your usual mucus.  You have a stiff neck.  You have changes in your:  Vision.  Hearing.  Thinking.  Mood. MAKE SURE YOU:   Understand these instructions.  Will watch your condition.  Will get help right away if you are not doing well or get worse.   This information is not intended to replace advice given to you by your health care provider. Make sure you discuss any questions you  have with your health care provider.   Document Released: 09/26/2007 Document Revised: 08/24/2014 Document Reviewed:  07/15/2013 Elsevier Interactive Patient Education [employer].

## 2016-03-22 ENCOUNTER — Observation Stay (HOSPITAL_COMMUNITY): Payer: Self-pay

## 2016-03-22 ENCOUNTER — Encounter (HOSPITAL_COMMUNITY): Payer: Self-pay | Admitting: Emergency Medicine

## 2016-03-22 ENCOUNTER — Observation Stay (HOSPITAL_COMMUNITY)
Admission: EM | Admit: 2016-03-22 | Discharge: 2016-03-23 | Disposition: A | Discharge status: Still In Hospital | Payer: Self-pay | Attending: Family Medicine | Admitting: Family Medicine

## 2016-03-22 DIAGNOSIS — N920 Excessive and frequent menstruation with regular cycle: Secondary | ICD-10-CM | POA: Insufficient documentation

## 2016-03-22 DIAGNOSIS — D500 Iron deficiency anemia secondary to blood loss (chronic): Secondary | ICD-10-CM | POA: Insufficient documentation

## 2016-03-22 DIAGNOSIS — D519 Vitamin B12 deficiency anemia, unspecified: Secondary | ICD-10-CM | POA: Diagnosis present

## 2016-03-22 DIAGNOSIS — I10 Essential (primary) hypertension: Secondary | ICD-10-CM | POA: Insufficient documentation

## 2016-03-22 DIAGNOSIS — D259 Leiomyoma of uterus, unspecified: Principal | ICD-10-CM | POA: Diagnosis present

## 2016-03-22 DIAGNOSIS — D649 Anemia, unspecified: Secondary | ICD-10-CM

## 2016-03-22 DIAGNOSIS — R531 Weakness: Secondary | ICD-10-CM

## 2016-03-22 DIAGNOSIS — F17210 Nicotine dependence, cigarettes, uncomplicated: Secondary | ICD-10-CM | POA: Insufficient documentation

## 2016-03-22 DIAGNOSIS — N8500 Endometrial hyperplasia, unspecified: Secondary | ICD-10-CM | POA: Insufficient documentation

## 2016-03-22 DIAGNOSIS — E538 Deficiency of other specified B group vitamins: Secondary | ICD-10-CM | POA: Insufficient documentation

## 2016-03-22 DIAGNOSIS — R9389 Abnormal findings on diagnostic imaging of other specified body structures: Secondary | ICD-10-CM | POA: Diagnosis present

## 2016-03-22 DIAGNOSIS — R197 Diarrhea, unspecified: Secondary | ICD-10-CM | POA: Insufficient documentation

## 2016-03-22 DIAGNOSIS — J069 Acute upper respiratory infection, unspecified: Secondary | ICD-10-CM | POA: Insufficient documentation

## 2016-03-22 LAB — CBC WITH DIFFERENTIAL/PLATELET
Basophils Absolute: 0 10*3/uL (ref 0.0–0.1)
Basophils Relative: 0 %
EOS PCT: 2 %
Eosinophils Absolute: 0.3 10*3/uL (ref 0.0–0.7)
HEMATOCRIT: 23.5 % — AB (ref 36.0–46.0)
HEMOGLOBIN: 6.3 g/dL — AB (ref 12.0–15.0)
LYMPHS ABS: 3 10*3/uL (ref 0.7–4.0)
Lymphocytes Relative: 23 %
MCH: 17.5 pg — ABNORMAL LOW (ref 26.0–34.0)
MCHC: 26.8 g/dL — ABNORMAL LOW (ref 30.0–36.0)
MCV: 65.1 fL — AB (ref 78.0–100.0)
MONO ABS: 0.8 10*3/uL (ref 0.1–1.0)
Monocytes Relative: 6 %
NEUTROS ABS: 8.8 10*3/uL — AB (ref 1.7–7.7)
Neutrophils Relative %: 69 %
Platelets: 309 10*3/uL (ref 150–400)
RBC: 3.61 MIL/uL — AB (ref 3.87–5.11)
RDW: 21.2 % — ABNORMAL HIGH (ref 11.5–15.5)
WBC: 12.9 10*3/uL — AB (ref 4.0–10.5)

## 2016-03-22 LAB — COMPREHENSIVE METABOLIC PANEL
ALBUMIN: 3.7 g/dL (ref 3.5–5.0)
ALK PHOS: 71 U/L (ref 38–126)
ALT: 13 U/L — ABNORMAL LOW (ref 14–54)
AST: 19 U/L (ref 15–41)
Anion gap: 6 (ref 5–15)
BILIRUBIN TOTAL: 0.3 mg/dL (ref 0.3–1.2)
BUN: 10 mg/dL (ref 6–20)
CO2: 21 mmol/L — ABNORMAL LOW (ref 22–32)
CREATININE: 0.8 mg/dL (ref 0.44–1.00)
Calcium: 8.5 mg/dL — ABNORMAL LOW (ref 8.9–10.3)
Chloride: 110 mmol/L (ref 101–111)
GFR calc Af Amer: >60 mL/min (ref >60)
GFR calc non Af Amer: >60 mL/min (ref >60)
GLUCOSE: 105 mg/dL — AB (ref 65–99)
Potassium: 3.7 mmol/L (ref 3.5–5.1)
Sodium: 137 mmol/L (ref 135–145)
Total Protein: 6.3 g/dL — ABNORMAL LOW (ref 6.5–8.1)

## 2016-03-22 LAB — FERRITIN: Ferritin: 5 ng/mL — ABNORMAL LOW (ref 11–307)

## 2016-03-22 LAB — TSH: TSH: 0.785 u[IU]/mL (ref 0.350–4.500)

## 2016-03-22 LAB — CBC
HCT: 29.7 % — ABNORMAL LOW (ref 36.0–46.0)
Hemoglobin: 8.6 g/dL — ABNORMAL LOW (ref 12.0–15.0)
MCH: 20 pg — ABNORMAL LOW (ref 26.0–34.0)
MCHC: 29 g/dL — AB (ref 30.0–36.0)
MCV: 68.9 fL — ABNORMAL LOW (ref 78.0–100.0)
PLATELETS: 290 10*3/uL (ref 150–400)
RBC: 4.31 MIL/uL (ref 3.87–5.11)
RDW: 23.3 % — AB (ref 11.5–15.5)
WBC: 12 10*3/uL — AB (ref 4.0–10.5)

## 2016-03-22 LAB — URINALYSIS, ROUTINE W REFLEX MICROSCOPIC
BILIRUBIN URINE: NEGATIVE
GLUCOSE, UA: NEGATIVE mg/dL
HGB URINE DIPSTICK: NEGATIVE
KETONES UR: NEGATIVE mg/dL
Leukocytes, UA: NEGATIVE
Nitrite: NEGATIVE
PROTEIN: NEGATIVE mg/dL
Specific Gravity, Urine: 1.015 (ref 1.005–1.030)
pH: 6 (ref 5.0–8.0)

## 2016-03-22 LAB — MAGNESIUM: MAGNESIUM: 2 mg/dL (ref 1.7–2.4)

## 2016-03-22 LAB — RETICULOCYTES
RBC.: 4.34 MIL/uL (ref 3.87–5.11)
Retic Count, Absolute: 60.8 10*3/uL (ref 19.0–186.0)
Retic Ct Pct: 1.4 % (ref 0.4–3.1)

## 2016-03-22 LAB — FOLATE: FOLATE: 5.2 ng/mL — AB (ref >5.9)

## 2016-03-22 LAB — PHOSPHORUS: PHOSPHORUS: 2.4 mg/dL — AB (ref 2.5–4.6)

## 2016-03-22 LAB — PROTIME-INR
INR: 1.08
Prothrombin Time: 14 seconds (ref 11.4–15.2)

## 2016-03-22 LAB — APTT: aPTT: 36 seconds (ref 24–36)

## 2016-03-22 LAB — PREPARE RBC (CROSSMATCH)

## 2016-03-22 LAB — IRON AND TIBC
IRON: 40 ug/dL (ref 28–170)
SATURATION RATIOS: 8 % — AB (ref 10.4–31.8)
TIBC: 482 ug/dL — AB (ref 250–450)
UIBC: 442 ug/dL

## 2016-03-22 LAB — HEMOGLOBIN AND HEMATOCRIT, BLOOD
HEMATOCRIT: 24.4 % — AB (ref 36.0–46.0)
HEMOGLOBIN: 6.5 g/dL — AB (ref 12.0–15.0)

## 2016-03-22 LAB — VITAMIN B12: VITAMIN B 12: 136 pg/mL — AB (ref 180–914)

## 2016-03-22 LAB — POC OCCULT BLOOD, ED: Fecal Occult Bld: NEGATIVE

## 2016-03-22 LAB — POC URINE PREG, ED: Preg Test, Ur: NEGATIVE

## 2016-03-22 MED ORDER — SODIUM CHLORIDE 0.9 % IV SOLN
10.0000 mL/h | Freq: Once | INTRAVENOUS | Status: AC
  Administered 2016-03-22: 10 mL/h via INTRAVENOUS

## 2016-03-22 MED ORDER — ACETAMINOPHEN 325 MG PO TABS
650.0000 mg | ORAL_TABLET | Freq: Four times a day (QID) | ORAL | Status: DC | PRN
  Administered 2016-03-22 (×2): 650 mg via ORAL
  Filled 2016-03-22 (×2): qty 2

## 2016-03-22 MED ORDER — SODIUM CHLORIDE 0.9 % IV BOLUS (SEPSIS): 1000.0000 mL | Freq: Once | INTRAVENOUS | Status: DC

## 2016-03-22 MED ORDER — ENOXAPARIN SODIUM 40 MG/0.4ML ~~LOC~~ SOLN
40.0000 mg | SUBCUTANEOUS | Status: DC
  Administered 2016-03-22: 40 mg via SUBCUTANEOUS
  Filled 2016-03-22: qty 0.4

## 2016-03-22 MED ORDER — AEROCHAMBER PLUS W/MASK MISC
Freq: Once | Status: AC
  Administered 2016-03-22: 04:00:00
  Filled 2016-03-22: qty 1

## 2016-03-22 MED ORDER — ALBUTEROL SULFATE HFA 108 (90 BASE) MCG/ACT IN AERS
2.0000 | INHALATION_SPRAY | RESPIRATORY_TRACT | Status: DC | PRN
  Administered 2016-03-22: 2 via RESPIRATORY_TRACT
  Filled 2016-03-22: qty 6.7

## 2016-03-22 MED ORDER — ONDANSETRON HCL 4 MG PO TABS: 4.0000 mg | ORAL_TABLET | Freq: Four times a day (QID) | ORAL | Status: DC | PRN

## 2016-03-22 MED ORDER — ACETAMINOPHEN 650 MG RE SUPP: 650.0000 mg | Freq: Four times a day (QID) | RECTAL | Status: DC | PRN

## 2016-03-22 MED ORDER — CYANOCOBALAMIN 1000 MCG/ML IJ SOLN
1000.0000 ug | Freq: Once | INTRAMUSCULAR | Status: AC
  Administered 2016-03-23: 1000 ug via INTRAMUSCULAR
  Filled 2016-03-22: qty 1

## 2016-03-22 MED ORDER — FOLIC ACID 1 MG PO TABS
1.0000 mg | ORAL_TABLET | Freq: Every day | ORAL | Status: DC
  Administered 2016-03-23: 1 mg via ORAL
  Filled 2016-03-22: qty 1

## 2016-03-22 MED ORDER — NICOTINE 21 MG/24HR TD PT24
21.0000 mg | MEDICATED_PATCH | Freq: Every day | TRANSDERMAL | Status: DC
  Administered 2016-03-22 – 2016-03-23 (×2): 21 mg via TRANSDERMAL
  Filled 2016-03-22 (×2): qty 1

## 2016-03-22 MED ORDER — ONDANSETRON HCL 4 MG/2ML IJ SOLN
4.0000 mg | Freq: Four times a day (QID) | INTRAMUSCULAR | Status: DC | PRN
  Administered 2016-03-22: 4 mg via INTRAVENOUS
  Filled 2016-03-22: qty 2

## 2016-03-22 MED ORDER — ALBUTEROL SULFATE (2.5 MG/3ML) 0.083% IN NEBU: 2.5000 mg | INHALATION_SOLUTION | RESPIRATORY_TRACT | Status: DC | PRN

## 2016-03-22 MED ORDER — FLUTICASONE PROPIONATE 50 MCG/ACT NA SUSP
2.0000 | Freq: Once | NASAL | Status: AC
  Administered 2016-03-22: 2 via NASAL
  Filled 2016-03-22: qty 16

## 2016-03-22 NOTE — Care Management Note (Signed)
Case Management Note  Patient Details  Name: NILDA KROLCZYK MRN: [id number] Date of Birth: 09-May-1965  Subjective/Objective:                  50 y.o. female with medical history significant for hypertension currently not on medications and heavy periods. / From home.  Action/Plan: Follow for disposition needs.   Expected Discharge Date:  03/23/16               Expected Discharge Plan:  Home/Self Care  In-House Referral:  Financial Counselor  Discharge planning Services  CM Consult, [facility]   Status of Service:  In process, will continue to follow      Additional Comments: No insurance; no PCP or GYN and will need OP follow up for heavy menses and uncontrolled HTN.     Fuller Mandril, RN 03/22/2016, 9:41 AM

## 2016-03-22 NOTE — ED Notes (Signed)
Notified ultrasound that pt is done receiving blood at this time.

## 2016-03-22 NOTE — Progress Notes (Addendum)
Folate level returned low so had started oral folic acid. Vitamin B12 quite low at 136 have given a 1 time B12 injection and likely will need monthly injections for at least 6 months.  Transvaginal ultrasound reveals new finding of fundal myometrial fibroid measuring 4.1 x 4.6 cm with thickening of the endometrium up to 2.5 cm. Patient not actively bleeding and will definitely need outpatient GYN follow-up after discharge  Erin Hearing, ANP

## 2016-03-22 NOTE — Progress Notes (Signed)
This is a no charge note  Pending admission per PA, Jarrett Soho.  50 year old lady with past medical history of dysmenorrhea, heavy menstrual period, anemia, blood transfusion, hypertension, anxiety, SVT, who presents with multiple complaints including fatigue, diarrhea, cough, nausea, body aches, chills. Found to have hemoglobin dropped from 10.1 on 08/19/14--> 6.3, most likely due to heavy menstrual period.  Patient has a tachycardia, dissection 100% on room air. Currently hemodynamically stable. Pt is accepted to tele bed for obs. 2 units of blood will be transfused.  Ivor Costa, MD  [facility] Pager [phone]  If 7PM-7AM, please contact night-coverage [email or web address] Password [email or web address] 03/22/2016, 5:23 AM

## 2016-03-22 NOTE — ED Provider Notes (Signed)
[facility] DEPT Provider Note   CSN: [id number] Arrival date & time: 03/22/16  0016     History   Chief Complaint Chief Complaint  Patient presents with  . Diarrhea    HPI Kimberly Jefferson is a 50 y.o. female with a hx of Anxiety, dysmenorrhea, hypertension, anemia secondary to very heavy periods presents to the Emergency Department complaining of gradual, persistent, progressively worsening URI symptoms onset yesterday afternoon. Associated symptoms include mild generalized headache, cough, nasal congestion, sore throat, postnasal drip. Patient denies neck pain, neck stiffness, fever, chills, chest pain, shortness of breath, abdominal pain, nausea, vomiting.  Patient has had multiple episodes of nonbloody diarrhea without melena or hematochezia.  No treatments prior to arrival. Nothing makes her symptoms better or worse.  She does endorse significant fatigue in the last 2 days.  She reports several years of very heavy periods requiring multiple layers of pads and tampons.  She reports IUD insertion approximately 2 years ago which helped for approximately 5 months. After that her heavy menstrual cycles returned. Patient reports that approximately one year ago she ran out of her iron supplements and has not had any since. She states previous blood transfusion in 2013 due to anemia.  Pt reports her menses just finished.    The history is provided by the patient and medical records. No language interpreter was used.    Past Medical History:  Diagnosis Date  . Anxiety    no meds  . Dysmenorrhea   . Heart murmur    "nothing to worry about" dx child, no problems ever  . History of blood transfusion  2013   heavy periods, MC 2 units transfuesd  . HTN (hypertension) 03/12/2012   one time no med  . Hypertension    one time  . Pneumonia     hx "had it once a year for a few years"  last time was 3- 4 years ago.  . SVD (spontaneous vaginal delivery)    x 2    Patient Active Problem  List   Diagnosis Date Noted  . Symptomatic anemia 03/22/2016  . Ankle fracture, right 06/18/2012  . Hypokalemia 03/13/2012  . Chest pain 03/12/2012  . HTN (hypertension) 03/12/2012  . Trichomonas 03/12/2012  . Left sided numbness 03/11/2012  . Syncope 03/11/2012  . Anemia 03/11/2012    Past Surgical History:  Procedure Laterality Date  . [place]   x 1  . CHOLECYSTECTOMY  1990  . ORIF ANKLE FRACTURE Right 06/18/2012   Procedure: OPEN REDUCTION INTERNAL FIXATION (ORIF) ANKLE FRACTURE;  Surgeon: Augustin Schooling, MD;  Location: [facility];  Service: Orthopedics;  Laterality: Right;  . TUBAL LIGATION  1990    OB History    No data available       Home Medications    Prior to Admission medications   Medication Sig Start Date End Date Taking? Authorizing Provider  EPINEPHrine (EPIPEN 2-PAK) 0.3 mg/0.3 mL IJ SOAJ injection Inject 0.3 mLs (0.3 mg total) into the muscle once as needed (for severe allergic reaction). CAll 911 immediately if you have to use this medicine 12/02/13  Yes Comer Locket, PA-C  albuterol (PROVENTIL HFA;VENTOLIN HFA) 108 (90 Base) MCG/ACT inhaler Inhale 2 puffs into the lungs every 4 (four) hours as needed for wheezing or shortness of breath. Patient not taking: Reported on 03/22/2016 04/25/15   Janne Napoleon, NP  doxycycline (VIBRAMYCIN) 100 MG capsule Take 1 capsule (100 mg total) by mouth 2 (two) times daily. Patient  not taking: Reported on 03/22/2016 08/13/14   Melony Overly, MD  Guaifenesin 1200 MG TB12 Take 1 tablet (1,200 mg total) by mouth 2 (two) times daily. Patient not taking: Reported on 03/22/2016 08/19/14   Dalia Heading, PA-C  ondansetron (ZOFRAN) 4 MG tablet Take 1 tablet (4 mg total) by mouth every 6 (six) hours. Patient not taking: Reported on 03/22/2016 04/25/15   Janne Napoleon, NP    Family History Family History  Problem Relation Age of Onset  . Breast cancer Maternal Aunt     Social History Social History  Substance Use  Topics  . Smoking status: Current Every Day Smoker    Packs/day: 1.00    Years: 20.00    Types: Cigarettes  . Smokeless tobacco: Never Used  . Alcohol use No     Allergies   Bee venom and Doxycycline   Review of Systems Review of Systems  Constitutional: Positive for fatigue. Negative for appetite change, chills and fever.  HENT: Positive for congestion, postnasal drip, rhinorrhea, sinus pressure and sore throat. Negative for ear discharge, ear pain and mouth sores.   Eyes: Negative for visual disturbance.  Respiratory: Positive for cough. Negative for chest tightness, shortness of breath, wheezing and stridor.   Cardiovascular: Negative for chest pain, palpitations and leg swelling.  Gastrointestinal: Positive for diarrhea ( Several episodes of loose stools). Negative for abdominal pain, nausea and vomiting.  Genitourinary: Negative for dysuria, frequency, hematuria and urgency.  Musculoskeletal: Negative for arthralgias, back pain, myalgias and neck stiffness.  Skin: Negative for rash.  Neurological: Positive for headaches. Negative for syncope, light-headedness and numbness.  Hematological: Negative for adenopathy.  Psychiatric/Behavioral: The patient is not nervous/anxious.   All other systems reviewed and are negative.    Physical Exam Updated Vital Signs BP 136/83   Pulse 77   Temp 98.2 F (36.8 C) (Oral)   Resp 16   SpO2 100%   Physical Exam  Constitutional: She appears well-developed and well-nourished. No distress.  Awake, alert, nontoxic appearance  HENT:  Head: Normocephalic and atraumatic.  Right Ear: Tympanic membrane, external ear and ear canal normal.  Left Ear: Tympanic membrane, external ear and ear canal normal.  Nose: Mucosal edema and rhinorrhea present. No epistaxis. Right sinus exhibits no maxillary sinus tenderness and no frontal sinus tenderness. Left sinus exhibits no maxillary sinus tenderness and no frontal sinus tenderness.  Mouth/Throat:  Uvula is midline, oropharynx is clear and moist and mucous membranes are normal. Mucous membranes are not pale and not cyanotic. No oropharyngeal exudate, posterior oropharyngeal edema, posterior oropharyngeal erythema or tonsillar abscesses.  Pale mucous membranes  Eyes: Conjunctivae are normal. Pupils are equal, round, and reactive to light. No scleral icterus.  Neck: Normal range of motion and full passive range of motion without pain. Neck supple.  Cardiovascular: Normal rate, regular rhythm and intact distal pulses.   Pulmonary/Chest: Effort normal and breath sounds normal. No stridor. No respiratory distress. She has no decreased breath sounds. She has no wheezes. She has no rales.  Equal chest expansion  Abdominal: Soft. Bowel sounds are normal. She exhibits no mass. There is no tenderness. There is no rebound and no guarding.  Musculoskeletal: Normal range of motion. She exhibits no edema.  Lymphadenopathy:    She has no cervical adenopathy.  Neurological: She is alert.  Speech is clear and goal oriented Moves extremities without ataxia  Skin: Skin is warm and dry. No rash noted. She is not diaphoretic.  Psychiatric: She has a normal  mood and affect.  Nursing note and vitals reviewed.    ED Treatments / Results  Labs (all labs ordered are listed, but only abnormal results are displayed) Labs Reviewed  CBC WITH DIFFERENTIAL/PLATELET - Abnormal; Notable for the following:       Result Value   WBC 12.9 (*)    RBC 3.61 (*)    Hemoglobin 6.3 (*)    HCT 23.5 (*)    MCV 65.1 (*)    MCH 17.5 (*)    MCHC 26.8 (*)    RDW 21.2 (*)    Neutro Abs 8.8 (*)    All other components within normal limits  COMPREHENSIVE METABOLIC PANEL - Abnormal; Notable for the following:    CO2 21 (*)    Glucose, Bld 105 (*)    Calcium 8.5 (*)    Total Protein 6.3 (*)    ALT 13 (*)    All other components within normal limits  HEMOGLOBIN AND HEMATOCRIT, BLOOD - Abnormal; Notable for the following:      Hemoglobin 6.5 (*)    HCT 24.4 (*)    All other components within normal limits  URINALYSIS, ROUTINE W REFLEX MICROSCOPIC (NOT AT [facility])  APTT  PROTIME-INR  POC URINE PREG, ED  POC OCCULT BLOOD, ED  POC OCCULT BLOOD, ED  TYPE AND SCREEN  PREPARE RBC (CROSSMATCH)    Procedures Procedures (including critical care time)  CRITICAL CARE Performed by: Abigail Butts Total critical care time: 60 minutes Critical care time was exclusive of separately billable procedures and treating other patients. Critical care was necessary to treat or prevent imminent or life-threatening deterioration. Critical care was time spent personally by me on the following activities: development of treatment plan with patient and/or surrogate as well as nursing, discussions with consultants, evaluation of patient's response to treatment, examination of patient, obtaining history from patient or surrogate, ordering and performing treatments and interventions, ordering and review of laboratory studies, ordering and review of radiographic studies, pulse oximetry and re-evaluation of patient's condition.   Medications Ordered in ED Medications  sodium chloride 0.9 % bolus 1,000 mL (not administered)  albuterol (PROVENTIL HFA;VENTOLIN HFA) 108 (90 Base) MCG/ACT inhaler 2 puff (2 puffs Inhalation Given 03/22/16 0413)  0.9 %  sodium chloride infusion (not administered)  aerochamber plus with mask device ( Other Given 03/22/16 0413)  fluticasone (FLONASE) 50 MCG/ACT nasal spray 2 spray (2 sprays Each Nare Given 03/22/16 [id number])     Initial Impression / Assessment and Plan / ED Course  I have reviewed the triage vital signs and the nursing notes.  Pertinent labs & imaging results that were available during my care of the patient were reviewed by me and considered in my medical decision making (see chart for details).  Clinical Course as of Mar 23 527  Thu Mar 22, 2016  0500 After long discussion, pt has  consented to blood transfusion.   [HM]  0501 Significant Hemoglobin: (!!) 6.3 [HM]  0501 WNL Creatinine: 0.80 [HM]  0501 neg Leukocytes, UA: NEGATIVE [HM]  0502 VSS.  No tachycardia BP: 134/75 [HM]  0520 Discussed with Dr. Blaine Hamper who will admit to tele-obs  [HM]    Clinical Course User Index [HM] Abigail Butts, PA-C    Patient presents with URI symptoms.  Breath sounds clear and equal. Patient afebrile. Highly doubt pneumonia. No x-ray indicated at this time.  Also found to have significant anemia. She reports increased weakness over the last several days but especially today. No chest  pain or shortness of breath. Discussed risk versus benefit of blood transfusion. Patient consents to blood transfusion.  Will be admitted.  Final Clinical Impressions(s) / ED Diagnoses   Final diagnoses:  Iron deficiency anemia due to chronic blood loss  Upper respiratory tract infection, unspecified type  Symptomatic anemia  Generalized weakness    New Prescriptions New Prescriptions   No medications on file     Abigail Butts, PA-C 03/22/16 [place], DO 03/22/16 [phone]

## 2016-03-22 NOTE — ED Notes (Signed)
Dr. Claudine Mouton notified on pt.'s low hemoglobin .

## 2016-03-22 NOTE — ED Notes (Signed)
Placed order for pt lunch tray 

## 2016-03-22 NOTE — H&P (Signed)
History and Physical    Kimberly Jefferson [id number] DOB: 1965-09-03 DOA: 03/22/2016   PCP: Maggie Font, MD   Patient coming from/Resides with: Private residence  Admission status: Observation/telemetry -maybe medically necessary to stay a minimum 2 midnights to rule out impending and/or unexpected changes in physiologic status that may differ from initial evaluation performed in the ER and/or at time of admission therefore consider reevaluation of admission status in the next 24 hours.   Chief Complaint: Fatigue  HPI: Kimberly Jefferson is a 50 y.o. female with medical history significant for hypertension currently not on medications and heavy periods. Patient just completed menstrual cycle and typically has very heavy bleeding for 5-6 days requiring her to wear a depends and she goes through a large box of supersize tampons and very large pads each menstrual cycle. She has been feeling progressively weak for the past several months and has also had upper respiratory infection symptoms for which she attributed her weakness to therefore presented to the ER for further evaluation and treatment. Of note she has been evaluated for heavy menstrual cycles in the past by GYN. Transvaginal ultrasound performed in 2013 did not reveal any fibroids did question an endometrial polyp. Patient currently has an IUD in place to help with her dysfunctional uterine bleeding. Evaluation in the ER revealed significant anemia with a hemoglobin of 6.3. In regards to her upper respiratory symptoms she has only had low-grade fever she has had cough without productive sputum. She's had nasal congestion.  ED Course:  Vital Signs: BP 123/76   Pulse 63   Temp 98.1 F (36.7 C) (Oral)   Resp 18   SpO2 100%  Lab data: Sodium 137, potassium 3.7, CO2 21, BUN 10, creatinine 0.8, glucose 105, LFTs normal except for slightly low total protein 6.3, white count 12,900 with hemoglobin 6.3, hematocrit 23.5, MCV 65.1, platelets  309,000; differential is normal except for slightly elevated neutrophils at 8.8% and polychromasia. Coags are normal. Urine pregnancy was negative. Fecal occult blood requested by ER. Medications and treatments: Normal saline bolus 1 L, albuterol inhaler 2;  puffs, Flonase 1 application, 2 units PRBCS ordered by ER for transfusion  Review of Systems:  In addition to the HPI above,  No chills, myalgias or other constitutional symptoms No Headache, changes with Vision or hearing, new weakness, tingling, numbness in any extremity, dizziness, dysarthria or word finding difficulty, gait disturbance or imbalance, tremors or seizure activity No problems swallowing food or Liquids, indigestion/reflux, choking or coughing while eating, abdominal pain with or after eating No Chest pain, Cough or Shortness of Breath, palpitations, orthopnea or DOE No Abdominal pain, N/V, melena,hematochezia, dark tarry stools, constipation No dysuria, malodorous urine, hematuria or flank pain No new skin rashes, lesions, masses or bruises, No new joint pains, aches, swelling or redness No recent unintentional weight gain or loss No polyuria, polydypsia or polyphagia   Past Medical History:  Diagnosis Date  . Anxiety    no meds  . Dysmenorrhea   . Heart murmur    "nothing to worry about" dx child, no problems ever  . History of blood transfusion  2013   heavy periods, MC 2 units transfuesd  . HTN (hypertension) 03/12/2012   one time no med  . Hypertension    one time  . Pneumonia     hx "had it once a year for a few years"  last time was 3- 4 years ago.  . SVD (spontaneous vaginal delivery)  x 2    Past Surgical History:  Procedure Laterality Date  . [place]   x 1  . CHOLECYSTECTOMY  1990  . ORIF ANKLE FRACTURE Right 06/18/2012   Procedure: OPEN REDUCTION INTERNAL FIXATION (ORIF) ANKLE FRACTURE;  Surgeon: Augustin Schooling, MD;  Location: [facility];  Service: Orthopedics;  Laterality:  Right;  . TUBAL LIGATION  1990    Social History   Social History  . Marital status: Divorced    Spouse name: N/A  . Number of children: N/A  . Years of education: N/A   Occupational History  . Not on file.   Social History Main Topics  . Smoking status: Current Every Day Smoker    Packs/day: 1.00    Years: 20.00    Types: Cigarettes  . Smokeless tobacco: Never Used  . Alcohol use No  . Drug use: No  . Sexual activity: Yes    Birth control/ protection: Surgical   Other Topics Concern  . Not on file   Social History Narrative  . 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 Bee Venom Anaphylaxis  . Doxycycline Nausea Only    Family History  Problem Relation Age of Onset  . Breast cancer Maternal Aunt    Family history reviewed andAddition to above history patient denies personal or family history of endometrial or ovarian cancer  Prior to Admission medications   Medication Sig Start Date End Date Taking? Authorizing Provider  EPINEPHrine (EPIPEN 2-PAK) 0.3 mg/0.3 mL IJ SOAJ injection Inject 0.3 mLs (0.3 mg total) into the muscle once as needed (for severe allergic reaction). CAll 911 immediately if you have to use this medicine 12/02/13  Yes Comer Locket, PA-C  albuterol (PROVENTIL HFA;VENTOLIN HFA) 108 (90 Base) MCG/ACT inhaler Inhale 2 puffs into the lungs every 4 (four) hours as needed for wheezing or shortness of breath. Patient not taking: Reported on 03/22/2016 04/25/15   Janne Napoleon, NP  doxycycline (VIBRAMYCIN) 100 MG capsule Take 1 capsule (100 mg total) by mouth 2 (two) times daily. Patient not taking: Reported on 03/22/2016 08/13/14   Melony Overly, MD  Guaifenesin 1200 MG TB12 Take 1 tablet (1,200 mg total) by mouth 2 (two) times daily. Patient not taking: Reported on 03/22/2016 08/19/14   Dalia Heading, PA-C  ondansetron (ZOFRAN) 4 MG  tablet Take 1 tablet (4 mg total) by mouth every 6 (six) hours. Patient not taking: Reported on 03/22/2016 04/25/15   Janne Napoleon, NP    Physical Exam: Vitals:   03/22/16 0649 03/22/16 0700 03/22/16 0709 03/22/16 0730  BP: 119/70 120/65 117/73 123/76  Pulse: 76 81 66 63  Resp: 16 18 16 18   Temp: 98.3 F (36.8 C)  98.1 F (36.7 C)   TempSrc: Oral  Oral   SpO2: 100% 100% 100% 100%      Constitutional: NAD, calm, comfortable Eyes: PERRL, lids and conjunctivae pale ENMT: Mucous membranes are moist. Posterior pharynx clear of any exudate or lesions.Normal dentition.  Neck: normal, supple, no masses, no thyromegaly Respiratory: clear to auscultation bilaterally, no wheezing, no crackles. Normal respiratory effort. No accessory muscle use.  Cardiovascular: Regular rate and rhythm, no murmurs / rubs / gallops. No extremity edema. 2+ pedal pulses. No carotid bruits.  Abdomen: no tenderness, no masses palpated. No hepatosplenomegaly. Bowel sounds positive.  Musculoskeletal: no clubbing / cyanosis. No joint deformity upper and  lower extremities. Good ROM, no contractures. Normal muscle tone.  Skin: no rashes, lesions, ulcers. No induration Neurologic: CN 2-12 grossly intact. Sensation intact, DTR normal. Strength 5/5 x all 4 extremities.  Psychiatric: Normal judgment and insight. Alert and oriented x 3. Normal mood.    Labs on Admission: I have personally reviewed following labs and imaging studies  CBC:  Recent Labs Lab 03/22/16 0030 03/22/16 0206  WBC 12.9*  --   NEUTROABS 8.8*  --   HGB 6.3* 6.5*  HCT 23.5* 24.4*  MCV 65.1*  --   PLT 309  --    Basic Metabolic Panel:  Recent Labs Lab 03/22/16 0030  NA 137  K 3.7  CL 110  CO2 21*  GLUCOSE 105*  BUN 10  CREATININE 0.80  CALCIUM 8.5*   GFR: CrCl cannot be calculated (Unknown ideal weight.). Liver Function Tests:  Recent Labs Lab 03/22/16 0030  AST 19  ALT 13*  ALKPHOS 71  BILITOT 0.3  PROT 6.3*  ALBUMIN 3.7     No results for input(s): LIPASE, AMYLASE in the last 168 hours. No results for input(s): AMMONIA in the last 168 hours. Coagulation Profile:  Recent Labs Lab 03/22/16 0206  INR 1.08   Cardiac Enzymes: No results for input(s): CKTOTAL, CKMB, CKMBINDEX, TROPONINI in the last 168 hours. BNP (last 3 results) No results for input(s): PROBNP in the last 8760 hours. HbA1C: No results for input(s): HGBA1C in the last 72 hours. CBG: No results for input(s): GLUCAP in the last 168 hours. Lipid Profile: No results for input(s): CHOL, HDL, LDLCALC, TRIG, CHOLHDL, LDLDIRECT in the last 72 hours. Thyroid Function Tests: No results for input(s): TSH, T4TOTAL, FREET4, T3FREE, THYROIDAB in the last 72 hours. Anemia Panel: No results for input(s): VITAMINB12, FOLATE, FERRITIN, TIBC, IRON, RETICCTPCT in the last 72 hours. Urine analysis:    Component Value Date/Time   COLORURINE YELLOW 03/22/2016 0028   APPEARANCEUR CLEAR 03/22/2016 0028   LABSPEC 1.015 03/22/2016 0028   PHURINE 6.0 03/22/2016 0028   GLUCOSEU NEGATIVE 03/22/2016 0028   HGBUR NEGATIVE 03/22/2016 0028   BILIRUBINUR NEGATIVE 03/22/2016 0028   KETONESUR NEGATIVE 03/22/2016 0028   PROTEINUR NEGATIVE 03/22/2016 0028   UROBILINOGEN 0.2 08/19/2014 1457   NITRITE NEGATIVE 03/22/2016 0028   LEUKOCYTESUR NEGATIVE 03/22/2016 0028   Sepsis Labs: @LABRCNTIP (procalcitonin:4,lacticidven:4) )No results found for this or any previous visit (from the past 240 hour(s)).   Radiological Exams on Admission: No results found.    Assessment/Plan Principal Problem:   Symptomatic Microcytic anemia/Heavy menstrual bleeding -History of recurrent heavy menses despite prior placement of IUD now presents with symptomatic microcytic anemia from excessive blood loss -For completeness of exam check fecal occult blood -Check TSH and anemia panel -Agree with transfusion 2 units PRBCs -Non-OB transvaginal ultrasound -Case management consulted to  assist with establishing with GYN clinic since patient currently does not have insurance -Currently not on menstrual cycle and no evidence of active bleeding  Active Problems:   URI (upper respiratory infection) -Consistent with viral syndrome and is stable and not hypoxic    HTN (hypertension) -Patient states currently not taking medication but admits "I probably she will be taking medication" -Current blood pressure not hypertensive range but also this is an setting of symptomatic anemia   Tobacco abuse -Missed one half pack to 1 pack of cigarettes per day but verbalizes desire to quit -Nicotine patch      DVT prophylaxis: Lovenox Code Status: Full  Family Communication: Family member at bedside  with patient's permission Disposition Plan: Anticipate discharge back to preadmission home environment when medically stable Consults called: None     Kimberly Jefferson L. ANP-BC [facility] Pager [phone]   If 7PM-7AM, please contact night-coverage [email or web address] Password [email or web address]  03/22/2016, 8:14 AM

## 2016-03-22 NOTE — ED Notes (Signed)
Pt reports heavy periods lasting 5-7 days per month.

## 2016-03-22 NOTE — ED Notes (Signed)
Placed order for pt breakfast tray 

## 2016-03-22 NOTE — ED Triage Notes (Signed)
Patient presents with multiple complaints : diarrhea , dry cough , nausea , nasal congestion , body aches and chills onset this evening .

## 2016-03-23 DIAGNOSIS — R938 Abnormal findings on diagnostic imaging of other specified body structures: Secondary | ICD-10-CM

## 2016-03-23 DIAGNOSIS — D649 Anemia, unspecified: Secondary | ICD-10-CM

## 2016-03-23 DIAGNOSIS — D259 Leiomyoma of uterus, unspecified: Principal | ICD-10-CM

## 2016-03-23 DIAGNOSIS — E538 Deficiency of other specified B group vitamins: Secondary | ICD-10-CM

## 2016-03-23 LAB — COMPREHENSIVE METABOLIC PANEL
ALBUMIN: 3.3 g/dL — AB (ref 3.5–5.0)
ALT: 11 U/L — ABNORMAL LOW (ref 14–54)
AST: 16 U/L (ref 15–41)
Alkaline Phosphatase: 66 U/L (ref 38–126)
Anion gap: 7 (ref 5–15)
BILIRUBIN TOTAL: 0.8 mg/dL (ref 0.3–1.2)
BUN: 7 mg/dL (ref 6–20)
CHLORIDE: 106 mmol/L (ref 101–111)
CO2: 26 mmol/L (ref 22–32)
Calcium: 9 mg/dL (ref 8.9–10.3)
Creatinine, Ser: 0.78 mg/dL (ref 0.44–1.00)
GFR calc Af Amer: >60 mL/min (ref >60)
GFR calc non Af Amer: >60 mL/min (ref >60)
Glucose, Bld: 85 mg/dL (ref 65–99)
POTASSIUM: 4 mmol/L (ref 3.5–5.1)
Sodium: 139 mmol/L (ref 135–145)
Total Protein: 6.1 g/dL — ABNORMAL LOW (ref 6.5–8.1)

## 2016-03-23 LAB — GC/CHLAMYDIA PROBE AMP (~~LOC~~) NOT AT ARMC
CHLAMYDIA, DNA PROBE: NEGATIVE
NEISSERIA GONORRHEA: NEGATIVE

## 2016-03-23 LAB — CBC
HEMATOCRIT: 31 % — AB (ref 36.0–46.0)
Hemoglobin: 9 g/dL — ABNORMAL LOW (ref 12.0–15.0)
MCH: 19.9 pg — ABNORMAL LOW (ref 26.0–34.0)
MCHC: 29 g/dL — AB (ref 30.0–36.0)
MCV: 68.6 fL — AB (ref 78.0–100.0)
Platelets: 308 10*3/uL (ref 150–400)
RBC: 4.52 MIL/uL (ref 3.87–5.11)
RDW: 23.3 % — AB (ref 11.5–15.5)
WBC: 13.4 10*3/uL — ABNORMAL HIGH (ref 4.0–10.5)

## 2016-03-23 MED ORDER — NICOTINE 21 MG/24HR TD PT24: 21.0000 mg | MEDICATED_PATCH | Freq: Every day | TRANSDERMAL | 0 refills | Status: DC

## 2016-03-23 MED ORDER — VITAMIN B-12 1000 MCG PO TABS: 1000.0000 ug | ORAL_TABLET | Freq: Every day | ORAL | 0 refills | Status: DC

## 2016-03-23 MED ORDER — FOLIC ACID 1 MG PO TABS: 1.0000 mg | ORAL_TABLET | Freq: Every day | ORAL | 0 refills | Status: DC

## 2016-03-23 MED ORDER — FERROUS SULFATE 325 (65 FE) MG PO TABS: 325.0000 mg | ORAL_TABLET | Freq: Two times a day (BID) | ORAL | 0 refills | Status: DC

## 2016-03-23 NOTE — Progress Notes (Signed)
Patient Discharge: Disposition: Patient discharged to home. Education: Reviewed all his medications, prescriptions, and discharge instructions, understood and acknowledged. IV: Discontinued IV before discharge. Telemetry: Discontinued Tele before discharge, CCMD notified. Transportation: Patient escorted out of the unit in w/c till the ride. Belongings: Patient took all her belonging with her.

## 2016-03-23 NOTE — Discharge Summary (Signed)
Physician Discharge Summary  Kimberly Jefferson  [id number]  DOB: June 20, 1965  DOA: 03/22/2016 PCP: Maggie Font, MD  Admit date: 03/22/2016 Discharge date: 03/23/2016  Admitted From: Home Disposition: Home  Recommendations for Outpatient Follow-up:  1. Follow up with PCP in 1 weeks 2. Please obtain BMP/CBC in one week   Home Health: None  Equipment/Devices: None  Discharge Condition: Stable  CODE STATUS: Full Diet recommendation: Heart Healthy   Brief/Interim Summary: 50 y/o F with PMHx of dysmenorrhea with IUD in place, anemia, HTN (not on medications), anxiety presented to the ED c/o fatigue, progressive weakness cough, body aches, and chills. Patient was found to be anemic with hemoglobin of 6.3. Patient report that she just finished her menstrual cycle which is normally very heavy. Patient was placed on observation, was administered 2 units of PRBC's and a vaginal [place] was done. Hb went up to 9.0 after transfusion, vaginal US showed a large fibroids and endometrial hyperplasia. Patient have significantly improved, she is back to baseline and it will be discharge home with PCP and GYN follow up   Subjective: Patient seen and examined at bedside. No new complaints. Patient interested on quitting smoking, she like the nicotine patch. Remains afebrile and tolerating diet.  Discharge Diagnoses:  Symptomatic anemia 2/2 uterine fibroids. Microcytic anemia - s/p 2 units PRBC, now asymptomatic  Improved  Iron supplement  Repeat CBC in 1 week   Uterine Fibroids and endometrial hyperplasia  GYN referral to the women health given  Folate and [id number] deficiency  Folic acid supplements  B12 IV OTO given during hospital stay  Cyanocobalamin 1000 mg daily  Follow up with PCP   Tobacco abuse  Patient eager to stop smoking Tobacco cessation discussed  Nicotine patch 21mg  - to taper with her PCP   URI - likely to be viral  Supportive treatment  Follow up with PCP    Discharge  Instructions  Discharge Instructions    Call MD for:  difficulty breathing, headache or visual disturbances    Complete by:  As directed    Call MD for:  extreme fatigue    Complete by:  As directed    Call MD for:  hives    Complete by:  As directed    Call MD for:  persistant dizziness or light-headedness    Complete by:  As directed    Call MD for:  persistant nausea and vomiting    Complete by:  As directed    Call MD for:  redness, tenderness, or signs of infection (pain, swelling, redness, odor or green/yellow discharge around incision site)    Complete by:  As directed    Call MD for:  severe uncontrolled pain    Complete by:  As directed    Call MD for:  temperature >100.4    Complete by:  As directed    Diet - low sodium heart healthy    Complete by:  As directed    Discharge instructions    Complete by:  As directed    Increase activity slowly    Complete by:  As directed        Medication List    STOP taking these medications   doxycycline 100 MG capsule Commonly known as:  VIBRAMYCIN   Guaifenesin 1200 MG Tb12   ondansetron 4 MG tablet Commonly known as:  ZOFRAN     TAKE these medications   albuterol 108 (90 Base) MCG/ACT inhaler Commonly known as:  PROVENTIL HFA;VENTOLIN HFA  Inhale 2 puffs into the lungs every 4 (four) hours as needed for wheezing or shortness of breath.   EPINEPHrine 0.3 mg/0.3 mL Soaj injection Commonly known as:  EPIPEN 2-PAK Inject 0.3 mLs (0.3 mg total) into the muscle once as needed (for severe allergic reaction). CAll 911 immediately if you have to use this medicine   ferrous sulfate 325 (65 FE) MG tablet Commonly known as:  FERROUSUL Take 1 tablet (325 mg total) by mouth 2 (two) times daily with a meal.   folic acid 1 MG tablet Commonly known as:  FOLVITE Take 1 tablet (1 mg total) by mouth daily.   nicotine 21 mg/24hr patch Commonly known as:  NICODERM CQ - dosed in mg/24 hours Place 1 patch (21 mg total) onto the skin  daily.   vitamin B-12 1000 MCG tablet Commonly known as:  CYANOCOBALAMIN Take 1 tablet (1,000 mcg total) by mouth daily.      Follow-up Information    Call [facility].   Specialty:  Obstetrics and Gynecology Why:  Please call to make a appointment. Thank you.  Contact information: [address], [place]       Maggie Font, MD. Schedule an appointment as soon as possible for a visit in 1 week(s).   Specialty:  Family Medicine Contact information: [address] [place] [address] [phone]          Allergies  Allergen Reactions  . Bee Venom Anaphylaxis  . Doxycycline Nausea Only    Consultations:  None   Procedures/Studies: US Transvaginal Non-ob  Result Date: 03/22/2016 CLINICAL DATA:  Heavy menses for 6 months, IUD placed 3 years ago EXAM: TRANSABDOMINAL AND TRANSVAGINAL ULTRASOUND OF PELVIS TECHNIQUE: Both transabdominal and transvaginal ultrasound examinations of the pelvis were performed. Transabdominal technique was performed for global imaging of the pelvis including uterus, ovaries, adnexal regions, and pelvic cul-de-sac. It was necessary to proceed with endovaginal exam following the transabdominal exam to visualize the right and left ovaries. COMPARISON:  CT scan 03/11/2013 FINDINGS: Uterus Measurements: 15 x 7.7 x 8.9 cm. Mild heterogeneous echogenicity. There is a left fundal fibroid measures 4.1 x 4.6 cm. Endometrium Thickness: There is thickening of the endometrium up to 2.5 cm. Endometrial hyperplasia for neoplastic process cannot be excluded. Further correlation is recommended. Right ovary Measurements: 2.3 x 1.5 x 2.2 cm. Normal appearance/no adnexal mass. Left ovary Measurements: 3.9 x 2 x 3.1 cm. Normal appearance/no adnexal mass. Other findings No pelvic free fluid. There is low position IUD within endocervical canal. IMPRESSION: 1. A left posterior of fundal  myometrial fibroid measures 4.12 x 4.6 cm. 2. There is thickening of endometrium up to 2.5 cm. Endometrial hyperplasia or neoplastic process cannot be excluded. Correlation with GYN exam and further correlation/ endometrial sampling is recommended. 3. Unremarkable ovaries. 4. Low position of IUD within cervical canal. Electronically Signed   By: Lahoma Crocker M.D.   On: 03/22/2016 13:20   US Pelvis Complete  Result Date: 03/22/2016 CLINICAL DATA:  Heavy menses for 6 months, IUD placed 3 years ago EXAM: TRANSABDOMINAL AND TRANSVAGINAL ULTRASOUND OF PELVIS TECHNIQUE: Both transabdominal and transvaginal ultrasound examinations of the pelvis were performed. Transabdominal technique was performed for global imaging of the pelvis including uterus, ovaries, adnexal regions, and pelvic cul-de-sac. It was necessary to proceed with endovaginal exam following the transabdominal exam to visualize the right and left ovaries. COMPARISON:  CT scan 03/11/2013 FINDINGS: Uterus Measurements:  15 x 7.7 x 8.9 cm. Mild heterogeneous echogenicity. There is a left fundal fibroid measures 4.1 x 4.6 cm. Endometrium Thickness: There is thickening of the endometrium up to 2.5 cm. Endometrial hyperplasia for neoplastic process cannot be excluded. Further correlation is recommended. Right ovary Measurements: 2.3 x 1.5 x 2.2 cm. Normal appearance/no adnexal mass. Left ovary Measurements: 3.9 x 2 x 3.1 cm. Normal appearance/no adnexal mass. Other findings No pelvic free fluid. There is low position IUD within endocervical canal. IMPRESSION: 1. A left posterior of fundal myometrial fibroid measures 4.12 x 4.6 cm. 2. There is thickening of endometrium up to 2.5 cm. Endometrial hyperplasia or neoplastic process cannot be excluded. Correlation with GYN exam and further correlation/ endometrial sampling is recommended. 3. Unremarkable ovaries. 4. Low position of IUD within cervical canal. Electronically Signed   By: Lahoma Crocker M.D.   On: 03/22/2016  13:20    Discharge Exam: Vitals:   03/23/16 0536 03/23/16 0941  BP: 139/76 132/77  Pulse: 73 79  Resp: 16 18  Temp: 98.2 F (36.8 C) 97.9 F (36.6 C)   Vitals:   03/22/16 2158 03/22/16 2216 03/23/16 0536 03/23/16 0941  BP: 125/71  139/76 132/77  Pulse: 72  73 79  Resp: 18  16 18   Temp: 98.1 F (36.7 C)  98.2 F (36.8 C) 97.9 F (36.6 C)  TempSrc: Oral  Oral Oral  SpO2: 100%  98% 99%  Weight:  73.9 kg (163 lb)    Height:        General: Pt is alert, awake, not in acute distress Cardiovascular: RRR, S1/S2 +, no rubs, no gallops Respiratory: CTA bilaterally, no wheezing, no rhonchi Abdominal: Soft, NT, ND, bowel sounds + Extremities: no edema, no cyanosis    The results of significant diagnostics from this hospitalization (including imaging, microbiology, ancillary and laboratory) are listed below for reference.     Microbiology: No results found for this or any previous visit (from the past 240 hour(s)).   Labs: BNP (last 3 results) No results for input(s): BNP in the last 8760 hours. Basic Metabolic Panel:  Recent Labs Lab 03/22/16 0030 03/22/16 1149 03/23/16 0500  NA 137  --  139  K 3.7  --  4.0  CL 110  --  106  CO2 21*  --  26  GLUCOSE 105*  --  85  BUN 10  --  7  CREATININE 0.80  --  0.78  CALCIUM 8.5*  --  9.0  MG  --  2.0  --   PHOS  --  2.4*  --    Liver Function Tests:  Recent Labs Lab 03/22/16 0030 03/23/16 0500  AST 19 16  ALT 13* 11*  ALKPHOS 71 66  BILITOT 0.3 0.8  PROT 6.3* 6.1*  ALBUMIN 3.7 3.3*   No results for input(s): LIPASE, AMYLASE in the last 168 hours. No results for input(s): AMMONIA in the last 168 hours. CBC:  Recent Labs Lab 03/22/16 0030 03/22/16 0206 03/22/16 1149 03/23/16 0500  WBC 12.9*  --  12.0* 13.4*  NEUTROABS 8.8*  --   --   --   HGB 6.3* 6.5* 8.6* 9.0*  HCT 23.5* 24.4* 29.7* 31.0*  MCV 65.1*  --  68.9* 68.6*  PLT 309  --  290 308   Cardiac Enzymes: No results for input(s): CKTOTAL, CKMB,  CKMBINDEX, TROPONINI in the last 168 hours. BNP: Invalid input(s): POCBNP CBG: No results for input(s): GLUCAP in the last 168 hours. D-Dimer No  results for input(s): DDIMER in the last 72 hours. Hgb A1c No results for input(s): HGBA1C in the last 72 hours. Lipid Profile No results for input(s): CHOL, HDL, LDLCALC, TRIG, CHOLHDL, LDLDIRECT in the last 72 hours. Thyroid function studies  Recent Labs  03/22/16 1149  TSH 0.785   Anemia work up  Recent Labs  03/22/16 1149  VITAMINB12 136*  FOLATE 5.2*  FERRITIN 5*  TIBC 482*  IRON 40  RETICCTPCT 1.4   Urinalysis    Component Value Date/Time   COLORURINE YELLOW 03/22/2016 0028   APPEARANCEUR CLEAR 03/22/2016 0028   LABSPEC 1.015 03/22/2016 0028   PHURINE 6.0 03/22/2016 0028   GLUCOSEU NEGATIVE 03/22/2016 0028   HGBUR NEGATIVE 03/22/2016 0028   BILIRUBINUR NEGATIVE 03/22/2016 0028   KETONESUR NEGATIVE 03/22/2016 0028   PROTEINUR NEGATIVE 03/22/2016 0028   UROBILINOGEN 0.2 08/19/2014 1457   NITRITE NEGATIVE 03/22/2016 0028   LEUKOCYTESUR NEGATIVE 03/22/2016 0028   Sepsis Labs Invalid input(s): PROCALCITONIN,  WBC,  LACTICIDVEN Microbiology No results found for this or any previous visit (from the past 240 hour(s)).   SIGNED:  Chipper Oman, MD  [facility] 03/23/2016, 7:17 PM Pager   If 7PM-7AM, please contact night-coverage [email or web address] Password [email or web address]

## 2016-03-23 NOTE — Care Management Note (Signed)
Case Management Note  Patient Details  Name: Kimberly Jefferson MRN: [id number] Date of Birth: [birth year]  Subjective/Objective:         CM following for progression and d/c planning.            Action/Plan: 03/23/2016 Met with pt re followup care as she is d/c today. This pt is familiar with the [facility] and will call to schedule another appointment there. She has received care there before, previous treatments were not successful and she was told that she most likely would need a hysterectomy due to her fibroids. She will arrange her own appointment and at this point thinks that she is ready to have a hysterectomy if still recommended by the doctor at the clinic.   Expected Discharge Date:  03/23/16               Expected Discharge Plan:  Home/Self Care  In-House Referral:  Financial Counselor  Discharge planning Services  CM Consult, [facility]  Post Acute Care Choice:   NA Choice offered to:   NA  DME Arranged:   NA DME Agency:   NA  HH Arranged:   NA HH Agency:   NA  Status of Service:  Completed, signed off  If discussed at [place] of Stay Meetings, dates discussed:    Additional Comments:  Adron Bene, RN 03/23/2016, 12:17 PM

## 2016-03-26 LAB — TYPE AND SCREEN
BLOOD PRODUCT EXPIRATION DATE: 201712152359
Blood Product Expiration Date: 201712152359
Blood Product Expiration Date: 201712182359
ISSUE DATE / TIME: 201711300637
ISSUE DATE / TIME: 201711300831
UNIT TYPE AND RH: 5100
Unit Type and Rh: 5100
Unit Type and Rh: 5100

## 2016-04-11 ENCOUNTER — Other Ambulatory Visit (HOSPITAL_COMMUNITY)
Admission: RE | Admit: 2016-04-11 | Discharge: 2016-04-11 | Disposition: A | Discharge status: Home / Self Care | Payer: Self-pay | Source: Ambulatory Visit | Attending: Obstetrics and Gynecology | Admitting: Obstetrics and Gynecology

## 2016-04-11 ENCOUNTER — Encounter: Payer: Self-pay | Admitting: Obstetrics and Gynecology

## 2016-04-11 ENCOUNTER — Ambulatory Visit (INDEPENDENT_AMBULATORY_CARE_PROVIDER_SITE_OTHER): Payer: Self-pay | Admitting: Obstetrics and Gynecology

## 2016-04-11 VITALS — BP 125/79 | HR 69 | Wt 167.0 lb

## 2016-04-11 DIAGNOSIS — Z1231 Encounter for screening mammogram for malignant neoplasm of breast: Secondary | ICD-10-CM

## 2016-04-11 DIAGNOSIS — N938 Other specified abnormal uterine and vaginal bleeding: Secondary | ICD-10-CM | POA: Insufficient documentation

## 2016-04-11 DIAGNOSIS — Z1151 Encounter for screening for human papillomavirus (HPV): Secondary | ICD-10-CM

## 2016-04-11 DIAGNOSIS — Z124 Encounter for screening for malignant neoplasm of cervix: Secondary | ICD-10-CM

## 2016-04-11 LAB — POCT PREGNANCY, URINE: Preg Test, Ur: NEGATIVE

## 2016-04-11 MED ORDER — MEDROXYPROGESTERONE ACETATE 10 MG PO TABS: 10.0000 mg | ORAL_TABLET | Freq: Every day | ORAL | 12 refills | Status: DC

## 2016-04-11 NOTE — Progress Notes (Signed)
[facility]10[facility]16 @ 1130 [facility]  Pt notified.

## 2016-04-11 NOTE — Progress Notes (Signed)
50 yo G3P3 here for the evaluation of DUB. Patient reports a monthly period lasting 5-6 days heavy in flow. She required 2 blood transfusion thus far. Her DUB was initially managed with Mirena IUD but over the past year and a half her heavy bleeding returned accompanied by severe dysmenorrhea. She typically has to wear depends diapers for her flow. Patient is without any other complaints. She is interested in definitive treatment. She is not sexually active.  Past Medical History:  Diagnosis Date  . Anxiety    no meds  . Dysmenorrhea   . Heart murmur    "nothing to worry about" dx child, no problems ever  . History of blood transfusion  2013   heavy periods, MC 2 units transfuesd  . HTN (hypertension) 03/12/2012   one time no med  . Hypertension    one time  . Pneumonia     hx "had it once a year for a few years"  last time was 3- 4 years ago.  . SVD (spontaneous vaginal delivery)    x 2   Past Surgical History:  Procedure Laterality Date  . [place]   x 1  . CHOLECYSTECTOMY  1990  . ORIF ANKLE FRACTURE Right 06/18/2012   Procedure: OPEN REDUCTION INTERNAL FIXATION (ORIF) ANKLE FRACTURE;  Surgeon: Augustin Schooling, MD;  Location: [facility];  Service: Orthopedics;  Laterality: Right;  . TUBAL LIGATION  1990   Family History  Problem Relation Age of Onset  . Breast cancer Maternal Aunt    Social History  Substance Use Topics  . Smoking status: Current Every Day Smoker    Packs/day: 1.00    Years: 20.00    Types: Cigarettes  . Smokeless tobacco: Never Used  . Alcohol use No   ROS See pertinent in HPI  Blood pressure 125/79, pulse 69, weight 167 lb (75.8 kg), last menstrual period 04/09/2016. GENERAL: Well-developed, well-nourished female in no acute distress.  HEENT: Normocephalic, atraumatic. Sclerae anicteric.  NECK: Supple. Normal thyroid.  LUNGS: Clear to auscultation bilaterally.  HEART: Regular rate and rhythm. ABDOMEN: Soft, nontender, nondistended. No  organomegaly. PELVIC: Normal external female genitalia. Vagina is pink and rugated.  Normal discharge. Normal appearing cervix with IUD rod extending through the os and into the vagina. Uterus is 16-weeks in size and mobile . No adnexal mass or tenderness. EXTREMITIES: No cyanosis, clubbing, or edema, 2+ distal pulses.   02/2016 ultrasound FINDINGS: Uterus  Measurements: 15 x 7.7 x 8.9 cm. Mild heterogeneous echogenicity. There is a left fundal fibroid measures 4.1 x 4.6 cm.  Endometrium  Thickness: There is thickening of the endometrium up to 2.5 cm. Endometrial hyperplasia for neoplastic process cannot be excluded. Further correlation is recommended.  Right ovary  Measurements: 2.3 x 1.5 x 2.2 cm. Normal appearance/no adnexal mass.  Left ovary  Measurements: 3.9 x 2 x 3.1 cm. Normal appearance/no adnexal mass.  Other findings  No pelvic free fluid. There is low position IUD within endocervical canal.  IMPRESSION: 1. A left posterior of fundal myometrial fibroid measures 4.12 x 4.6 cm. 2. There is thickening of endometrium up to 2.5 cm. Endometrial hyperplasia or neoplastic process cannot be excluded. Correlation with GYN exam and further correlation/ endometrial sampling is recommended. 3. Unremarkable ovaries. 4. Low position of IUD within cervical canal.   Electronically Signed   By: Lahoma Crocker M.D.   On: 03/22/2016 13:20 A/P 50 yo with DUB - IUD removed as it was malpositioned - Pap  smear collected - Discussed endometrial biopsy ENDOMETRIAL BIOPSY     The indications for endometrial biopsy were reviewed.   Risks of the biopsy including cramping, bleeding, infection, uterine perforation, inadequate specimen and need for additional procedures  were discussed. The patient states she understands and agrees to undergo procedure today. Consent was signed. Time out was performed. Urine HCG was negative. A sterile speculum was placed in the patient's vagina  and the cervix was prepped with Betadine. A single-toothed tenaculum was placed on the anterior lip of the cervix to stabilize it. The uterine cavity was sounded to a depth of 15 cm using the uterine sound. The 3 mm pipelle was introduced into the endometrial cavity without difficulty, 2 passes were made.  A  moderate amount of tissue was  sent to pathology. The instruments were removed from the patient's vagina. Minimal bleeding from the cervix was noted. The patient tolerated the procedure well.  Routine post-procedure instructions were given to the patient. The patient will follow up in two weeks to review the results and for further management.   - Discussed definitive treatment with hysterectomy. 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 She has private insurance which will be active in January 2018 and is interested in robotic hysterectomy. She has had 1 cesarean delivery followed by 2 VBAC - RX provera provided to help minimize bleeding and control anemia until surgery - Will have patient meet with Dr. Ihor Dow for [facility] evaluations and scheduling - Patient will be contacted with results - Screening mammogram ordered

## 2016-04-13 LAB — CYTOLOGY - PAP
DIAGNOSIS: NEGATIVE
HPV (WINDOPATH): NOT DETECTED

## 2016-04-18 ENCOUNTER — Encounter: Payer: Self-pay | Admitting: General Practice

## 2016-04-28 ENCOUNTER — Ambulatory Visit (INDEPENDENT_AMBULATORY_CARE_PROVIDER_SITE_OTHER): Payer: Self-pay

## 2016-04-28 ENCOUNTER — Encounter (HOSPITAL_COMMUNITY): Payer: Self-pay | Admitting: Emergency Medicine

## 2016-04-28 ENCOUNTER — Ambulatory Visit (HOSPITAL_COMMUNITY)
Admission: EM | Admit: 2016-04-28 | Discharge: 2016-04-28 | Disposition: A | Discharge status: Home / Self Care | Payer: Self-pay | Attending: Family Medicine | Admitting: Family Medicine

## 2016-04-28 DIAGNOSIS — J111 Influenza due to unidentified influenza virus with other respiratory manifestations: Secondary | ICD-10-CM

## 2016-04-28 DIAGNOSIS — J208 Acute bronchitis due to other specified organisms: Secondary | ICD-10-CM

## 2016-04-28 DIAGNOSIS — R69 Illness, unspecified: Secondary | ICD-10-CM

## 2016-04-28 MED ORDER — AMOXICILLIN-POT CLAVULANATE 875-125 MG PO TABS: 1.0000 | ORAL_TABLET | Freq: Two times a day (BID) | ORAL | 0 refills | Status: DC

## 2016-04-28 MED ORDER — GUAIFENESIN-CODEINE 100-10 MG/5ML PO SYRP: 10.0000 mL | ORAL_SOLUTION | Freq: Four times a day (QID) | ORAL | 0 refills | Status: DC | PRN

## 2016-04-28 NOTE — ED Provider Notes (Signed)
[facility]    CSN: [id number] Arrival date & time: 04/28/16  1229     History   Chief Complaint Chief Complaint  Patient presents with  . Cough    HPI Kimberly Jefferson is a 51 y.o. female.   HPI  Past Medical History:  Diagnosis Date  . Anxiety    no meds  . Dysmenorrhea   . Heart murmur    "nothing to worry about" dx child, no problems ever  . History of blood transfusion  2013   heavy periods, MC 2 units transfuesd  . HTN (hypertension) 03/12/2012   one time no med  . Hypertension    one time  . Pneumonia     hx "had it once a year for a few years"  last time was 3- 4 years ago.  . SVD (spontaneous vaginal delivery)    x 2    Patient Active Problem List   Diagnosis Date Noted  . Vitamin B12 deficiency anemia 03/22/2016  . Heavy menstrual bleeding 03/22/2016  . URI (upper respiratory infection) 03/22/2016  . Folate deficiency 03/22/2016  . Uterine fibroid 03/22/2016  . Increased endometrial stripe 03/22/2016  . Ankle fracture, right 06/18/2012  . Hypokalemia 03/13/2012  . Chest pain 03/12/2012  . HTN (hypertension) 03/12/2012  . Trichomonas 03/12/2012  . Left sided numbness 03/11/2012  . Syncope 03/11/2012  . Anemia 03/11/2012    Past Surgical History:  Procedure Laterality Date  . [place]   x 1  . CHOLECYSTECTOMY  1990  . ORIF ANKLE FRACTURE Right 06/18/2012   Procedure: OPEN REDUCTION INTERNAL FIXATION (ORIF) ANKLE FRACTURE;  Surgeon: Augustin Schooling, MD;  Location: [facility];  Service: Orthopedics;  Laterality: Right;  . TUBAL LIGATION  1990    OB History    No data available       Home Medications    Prior to Admission medications   Medication Sig Start Date End Date Taking? Authorizing Provider  ferrous sulfate (FERROUSUL) 325 (65 FE) MG tablet Take 1 tablet (325 mg total) by mouth 2 (two) times daily with a meal. 03/23/16  Yes Doreatha Lew, MD  albuterol (PROVENTIL HFA;VENTOLIN HFA) 108 (90 Base) MCG/ACT  inhaler Inhale 2 puffs into the lungs every 4 (four) hours as needed for wheezing or shortness of breath. 04/25/15   Janne Napoleon, NP    Family History Family History  Problem Relation Age of Onset  . Breast cancer Maternal Aunt     Social History Social History  Substance Use Topics  . Smoking status: Current Every Day Smoker    Packs/day: 1.00    Years: 20.00    Types: Cigarettes  . Smokeless tobacco: Never Used  . Alcohol use No     Allergies   Bee venom and Doxycycline   Review of Systems Review of Systems  Constitutional: Positive for chills and fever.  HENT: Positive for congestion, postnasal drip and rhinorrhea.   Respiratory: Positive for cough. Negative for shortness of breath and wheezing.   Musculoskeletal: Positive for myalgias.  All other systems reviewed and are negative.    Physical Exam Triage Vital Signs ED Triage Vitals [04/28/16 1343]  Enc Vitals Group     BP 146/87     Pulse Rate 103     Resp 18     Temp 99.6 F (37.6 C)     Temp Source Oral     SpO2 100 %     Weight  Height      Head Circumference      Peak Flow      Pain Score 8     Pain Loc      Pain Edu?      Excl. in [place]?    No data found.   Updated Vital Signs BP 146/87 (BP Location: Left Arm)   Pulse 103   Temp 99.6 F (37.6 C) (Oral)   Resp 18   LMP 04/09/2016 (Exact Date)   SpO2 100%   Visual Acuity Right Eye Distance:   Left Eye Distance:   Bilateral Distance:    Right Eye Near:   Left Eye Near:    Bilateral Near:     Physical Exam  Constitutional: She is oriented to person, place, and time. She appears well-developed and well-nourished.  HENT:  Head: Normocephalic.  Right Ear: External ear normal.  Left Ear: External ear normal.  Nose: Nose normal.  Mouth/Throat: Oropharynx is clear and moist.  Eyes: Pupils are equal, round, and reactive to light.  Neck: Normal range of motion. Neck supple.  Cardiovascular: Normal rate, regular rhythm, normal heart  sounds and intact distal pulses.   Pulmonary/Chest: Effort normal and breath sounds normal.  Abdominal: Soft. Bowel sounds are normal. There is no tenderness.  Neurological: She is alert and oriented to person, place, and time.  Skin: Skin is warm and dry.  Nursing note and vitals reviewed.    UC Treatments / Results  Labs (all labs ordered are listed, but only abnormal results are displayed) Labs Reviewed - No data to display  EKG  EKG Interpretation None       Radiology No results found. X-rays reviewed and report per radiologist.  Procedures Procedures (including critical care time)  Medications Ordered in UC Medications - No data to display   Initial Impression / Assessment and Plan / UC Course  I have reviewed the triage vital signs and the nursing notes.  Pertinent labs & imaging results that were available during my care of the patient were reviewed by me and considered in my medical decision making (see chart for details).  Clinical Course       Final Clinical Impressions(s) / UC Diagnoses   Final diagnoses:  None    New Prescriptions New Prescriptions   No medications on file     Billy Fischer, MD 04/28/16 1448

## 2016-04-28 NOTE — ED Triage Notes (Signed)
The patient presented to the [facility] with a complaint of a cough x 2 days.

## 2016-04-28 NOTE — Discharge Instructions (Signed)
Take all of medicine, drink lots of fluids, no more smoking, see your doctor if further problems  °

## 2016-05-10 ENCOUNTER — Ambulatory Visit: Payer: Self-pay

## 2016-05-11 ENCOUNTER — Encounter: Payer: Self-pay | Admitting: Obstetrics & Gynecology

## 2016-05-25 ENCOUNTER — Ambulatory Visit: Payer: Self-pay

## 2016-05-30 ENCOUNTER — Telehealth (HOSPITAL_COMMUNITY): Payer: Self-pay

## 2016-05-30 ENCOUNTER — Encounter: Payer: Self-pay | Admitting: Obstetrics & Gynecology

## 2016-05-30 ENCOUNTER — Ambulatory Visit: Payer: Self-pay | Admitting: Obstetrics & Gynecology

## 2016-05-30 NOTE — Telephone Encounter (Signed)
Called to obtain pt insurance information to proceed w/ scheduling surgery. Pt advised me that she does not have insurance. [facility] and stated they were going to mail her financial assistance paperwork. She states she as not received it yet. Advised pt to call their office and reschedule her "cancelled appt" to be seen, and get help with the paperwork. Pt understands.

## 2016-08-18 ENCOUNTER — Emergency Department (HOSPITAL_COMMUNITY): Payer: Self-pay

## 2016-08-18 ENCOUNTER — Emergency Department (HOSPITAL_COMMUNITY)
Admission: EM | Admit: 2016-08-18 | Discharge: 2016-08-18 | Disposition: A | Discharge status: Home / Self Care | Payer: Self-pay | Attending: Emergency Medicine | Admitting: Emergency Medicine

## 2016-08-18 ENCOUNTER — Encounter (HOSPITAL_COMMUNITY): Payer: Self-pay | Admitting: Emergency Medicine

## 2016-08-18 DIAGNOSIS — F17210 Nicotine dependence, cigarettes, uncomplicated: Secondary | ICD-10-CM | POA: Insufficient documentation

## 2016-08-18 DIAGNOSIS — R0789 Other chest pain: Secondary | ICD-10-CM | POA: Insufficient documentation

## 2016-08-18 DIAGNOSIS — Z79899 Other long term (current) drug therapy: Secondary | ICD-10-CM | POA: Insufficient documentation

## 2016-08-18 DIAGNOSIS — I10 Essential (primary) hypertension: Secondary | ICD-10-CM | POA: Insufficient documentation

## 2016-08-18 DIAGNOSIS — Z7982 Long term (current) use of aspirin: Secondary | ICD-10-CM | POA: Insufficient documentation

## 2016-08-18 LAB — BASIC METABOLIC PANEL
Anion gap: 8 (ref 5–15)
BUN: 7 mg/dL (ref 6–20)
CHLORIDE: 107 mmol/L (ref 101–111)
CO2: 24 mmol/L (ref 22–32)
CREATININE: 0.83 mg/dL (ref 0.44–1.00)
Calcium: 10 mg/dL (ref 8.9–10.3)
GFR calc Af Amer: >60 mL/min (ref >60)
GFR calc non Af Amer: >60 mL/min (ref >60)
GLUCOSE: 95 mg/dL (ref 65–99)
Potassium: 3.8 mmol/L (ref 3.5–5.1)
Sodium: 139 mmol/L (ref 135–145)

## 2016-08-18 LAB — I-STAT TROPONIN, ED: Troponin i, poc: 0 ng/mL (ref 0.00–0.08)

## 2016-08-18 LAB — CBC
HCT: 40.7 % (ref 36.0–46.0)
Hemoglobin: 13.3 g/dL (ref 12.0–15.0)
MCH: 27.8 pg (ref 26.0–34.0)
MCHC: 32.7 g/dL (ref 30.0–36.0)
MCV: 85.1 fL (ref 78.0–100.0)
PLATELETS: 243 10*3/uL (ref 150–400)
RBC: 4.78 MIL/uL (ref 3.87–5.11)
RDW: 14.5 % (ref 11.5–15.5)
WBC: 7.6 10*3/uL (ref 4.0–10.5)

## 2016-08-18 NOTE — ED Triage Notes (Signed)
Pt. Stated, I've had chest pain and lightheadedness, and it might be my blood is low.

## 2016-08-18 NOTE — ED Notes (Signed)
Pt c/o dizziness and chest pain x 2 days with tingling x 2 days.  Pain L axilla into left breast area reproduced on palpation.  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  Dizziness is what she cannot explain that has remained constant.

## 2016-08-18 NOTE — ED Provider Notes (Signed)
[place] DEPT Provider Note   CSN: [id number] Arrival date & time: 08/18/16  0813     History   Chief Complaint Chief Complaint  Patient presents with  . Chest Pain  . Dizziness    HPI Kimberly Jefferson is a 51 y.o. female.  The history is provided by the patient. No language interpreter was used.  Chest Pain   Associated symptoms include dizziness.  Dizziness  Associated symptoms: chest pain     Kimberly Jefferson is a 51 y.o. female who presents to the Emergency Department complaining of chest pain.  She reports 3 days of left anterior and axillary chest pain. Pain is described as a sharp and stabbing and burning sensation. It is better with activity and worse with rest. She wonders if she pulled a muscle at work, she works as a Development worker, international aid. She also reports a few days of headaches as well as lightheadedness and tingling in bilateral hands. She had similar symptoms in the past when she had anemia and needed a blood transfusion. She denies any fevers, cough, shortness of breath, abdominal pain, nausea, vomiting, leg swelling or pain. She recently had a heavy cycle but it was short. She less of the blood transfusion 4-5 months ago.  Past Medical History:  Diagnosis Date  . Anxiety    no meds  . Dysmenorrhea   . Heart murmur    "nothing to worry about" dx child, no problems ever  . History of blood transfusion  2013   heavy periods, MC 2 units transfuesd  . HTN (hypertension) 03/12/2012   one time no med  . Hypertension    one time  . Pneumonia     hx "had it once a year for a few years"  last time was 3- 4 years ago.  . SVD (spontaneous vaginal delivery)    x 2    Patient Active Problem List   Diagnosis Date Noted  . Vitamin B12 deficiency anemia 03/22/2016  . Heavy menstrual bleeding 03/22/2016  . URI (upper respiratory infection) 03/22/2016  . Folate deficiency 03/22/2016  . Uterine fibroid 03/22/2016  . Increased endometrial stripe 03/22/2016  . Ankle  fracture, right 06/18/2012  . Hypokalemia 03/13/2012  . Chest pain 03/12/2012  . HTN (hypertension) 03/12/2012  . Trichomonas 03/12/2012  . Left sided numbness 03/11/2012  . Syncope 03/11/2012  . Anemia 03/11/2012    Past Surgical History:  Procedure Laterality Date  . [place]   x 1  . CHOLECYSTECTOMY  1990  . ORIF ANKLE FRACTURE Right 06/18/2012   Procedure: OPEN REDUCTION INTERNAL FIXATION (ORIF) ANKLE FRACTURE;  Surgeon: Augustin Schooling, MD;  Location: [facility];  Service: Orthopedics;  Laterality: Right;  . TUBAL LIGATION  1990    OB History    No data available       Home Medications    Prior to Admission medications   Medication Sig Start Date End Date Taking? Authorizing Provider  albuterol (PROVENTIL HFA;VENTOLIN HFA) 108 (90 Base) MCG/ACT inhaler Inhale 2 puffs into the lungs every 4 (four) hours as needed for wheezing or shortness of breath. 04/25/15  Yes Janne Napoleon, NP  Aspirin-Acetaminophen-Caffeine (GOODY HEADACHE PO) Take 1 packet by mouth daily as needed (headache).   Yes Historical Provider, MD  ferrous sulfate (FERROUSUL) 325 (65 FE) MG tablet Take 1 tablet (325 mg total) by mouth 2 (two) times daily with a meal. 03/23/16  Yes Doreatha Lew, MD  medroxyPROGESTERone (PROVERA) 10 MG tablet Take  10 mg by mouth See admin instructions. Pt takes one daily for 21 days, then stops for 7 days 07/23/16  Yes Historical Provider, MD  pseudoephedrine (SUDAFED) 30 MG tablet Take 30 mg by mouth every 4 (four) hours as needed for congestion.   Yes Historical Provider, MD  amoxicillin-clavulanate (AUGMENTIN) 875-125 MG tablet Take 1 tablet by mouth 2 (two) times daily. Patient not taking: Reported on 08/18/2016 04/28/16   Billy Fischer, MD  guaiFENesin-codeine [facility]) 100-10 MG/5ML syrup Take 10 mLs by mouth 4 (four) times daily as needed for cough. Patient not taking: Reported on 08/18/2016 04/28/16   Billy Fischer, MD    Family History Family History  Problem  Relation Age of Onset  . Breast cancer Maternal Aunt     Social History Social History  Substance Use Topics  . Smoking status: Current Every Day Smoker    Packs/day: 1.00    Years: 20.00    Types: Cigarettes  . Smokeless tobacco: Never Used  . Alcohol use No     Allergies   Bee venom and Doxycycline   Review of Systems Review of Systems  Cardiovascular: Positive for chest pain.  Neurological: Positive for dizziness.  All other systems reviewed and are negative.    Physical Exam Updated Vital Signs BP (!) 145/95   Pulse (!) 39   Temp 98.6 F (37 C) (Oral)   Resp 16   Ht 5\' 6"  (1.676 m)   Wt 158 lb (71.7 kg)   LMP 07/10/2016   SpO2 99%   BMI 25.50 kg/m   Physical Exam  Constitutional: She is oriented to person, place, and time. She appears well-developed and well-nourished.  HENT:  Head: Normocephalic and atraumatic.  Cardiovascular: Regular rhythm.   No murmur heard. bradycardic  Pulmonary/Chest: Effort normal and breath sounds normal. No respiratory distress.  Abdominal: Soft. There is no tenderness. There is no rebound and no guarding.  Musculoskeletal: She exhibits no edema or tenderness.  Neurological: She is alert and oriented to person, place, and time.  Skin: Skin is warm and dry.  Psychiatric: She has a normal mood and affect. Her behavior is normal.  Nursing note and vitals reviewed.    ED Treatments / Results  Labs (all labs ordered are listed, but only abnormal results are displayed) Labs Reviewed  Opp, ED    EKG  EKG Interpretation  Date/Time:  Saturday August 18 2016 08:17:40 EDT Ventricular Rate:  62 PR Interval:  152 QRS Duration: 78 QT Interval:  398 QTC Calculation: 403 R Axis:   79 Text Interpretation:  Normal sinus rhythm with sinus arrhythmia Normal ECG Confirmed by Hazle Coca [phone]) on 08/18/2016 8:35:26 AM       Radiology Dg Chest 2 View  Result Date: 08/18/2016 CLINICAL  DATA:  51 year old female with a history of left-sided chest pain EXAM: CHEST  2 VIEW COMPARISON:  04/28/2016 FINDINGS: Cardiomediastinal silhouette unchanged. No evidence of central vascular congestion. No interlobular septal thickening. No confluent airspace disease, pneumothorax, or pleural effusion. No displaced fracture. IMPRESSION: No radiographic evidence of acute cardiopulmonary disease. Electronically Signed   By: Corrie Mckusick D.O.   On: 08/18/2016 09:42    Procedures Procedures (including critical care time)  Medications Ordered in ED Medications - No data to display   Initial Impression / Assessment and Plan / ED Course  I have reviewed the triage vital signs and the nursing notes.  Pertinent labs & imaging results  that were available during my care of the patient were reviewed by me and considered in my medical decision making (see chart for details).     Patient here for evaluation of chest pain for the last 3 days that is better with activity. Presentation is not consistent with ACS, PE, dissection, CHF, pneumonia. Counseled pt on home care for chest pain, outpatient follow-up and return precautions.  Final Clinical Impressions(s) / ED Diagnoses   Final diagnoses:  Atypical chest pain    New Prescriptions Discharge Medication List as of 08/18/2016 11:04 AM       Quintella Reichert, MD 08/19/16 1329

## 2016-08-18 NOTE — ED Notes (Signed)
Pt is in stable condition upon d/c and ambulates from ED. 

## 2016-08-18 NOTE — ED Notes (Signed)
Patient transported to X-ray 

## 2016-11-02 ENCOUNTER — Ambulatory Visit (HOSPITAL_COMMUNITY)
Admission: EM | Admit: 2016-11-02 | Discharge: 2016-11-02 | Disposition: A | Discharge status: Home / Self Care | Payer: Self-pay | Attending: Internal Medicine | Admitting: Internal Medicine

## 2016-11-02 ENCOUNTER — Encounter (HOSPITAL_COMMUNITY): Payer: Self-pay | Admitting: Emergency Medicine

## 2016-11-02 DIAGNOSIS — T63451A Toxic effect of venom of hornets, accidental (unintentional), initial encounter: Secondary | ICD-10-CM

## 2016-11-02 DIAGNOSIS — S60562A Insect bite (nonvenomous) of left hand, initial encounter: Secondary | ICD-10-CM

## 2016-11-02 MED ORDER — DIPHENHYDRAMINE HCL 50 MG/ML IJ SOLN
INTRAMUSCULAR | Status: AC
  Filled 2016-11-02: qty 1

## 2016-11-02 MED ORDER — DIPHENHYDRAMINE HCL 50 MG/ML IJ SOLN
50.0000 mg | Freq: Once | INTRAMUSCULAR | Status: AC
  Administered 2016-11-02: 50 mg via INTRAMUSCULAR

## 2016-11-02 MED ORDER — METHYLPREDNISOLONE ACETATE 80 MG/ML IJ SUSP
80.0000 mg | Freq: Once | INTRAMUSCULAR | Status: AC
  Administered 2016-11-02: 80 mg via INTRAMUSCULAR

## 2016-11-02 MED ORDER — METHYLPREDNISOLONE ACETATE 80 MG/ML IJ SUSP
INTRAMUSCULAR | Status: AC
  Filled 2016-11-02: qty 1

## 2016-11-02 MED ORDER — PREDNISONE 10 MG (21) PO TBPK: ORAL_TABLET | ORAL | 0 refills | Status: DC

## 2016-11-02 MED ORDER — FAMOTIDINE 20 MG PO TABS
20.0000 mg | ORAL_TABLET | Freq: Once | ORAL | Status: AC
  Administered 2016-11-02: 20 mg via ORAL

## 2016-11-02 MED ORDER — FAMOTIDINE 20 MG PO TABS
ORAL_TABLET | ORAL | Status: AC
  Filled 2016-11-02: qty 1

## 2016-11-02 NOTE — ED Notes (Signed)
Pt stepped outside per registration

## 2016-11-02 NOTE — ED Provider Notes (Signed)
CSN: [id number]     Arrival date & time 11/02/16  1039 History   None    Chief Complaint  Patient presents with  . Insect Bite   (Consider location/radiation/quality/duration/timing/severity/associated sxs/prior Treatment) Patient reports being stung by a hornet yesterday on doing yard work the left hand. She is right handed, she had increased swelling in the left hand, no fever or chills, no red streaking, no difficulty swallowing, difficulty breathing, wheezing, or respiratory problems. No nausea, no vomiting.   The history is provided by the patient.    Past Medical History:  Diagnosis Date  . Anxiety    no meds  . Dysmenorrhea   . Heart murmur    "nothing to worry about" dx child, no problems ever  . History of blood transfusion  2013   heavy periods, MC 2 units transfuesd  . HTN (hypertension) 03/12/2012   one time no med  . Hypertension    one time  . Pneumonia     hx "had it once a year for a few years"  last time was 3- 4 years ago.  . SVD (spontaneous vaginal delivery)    x 2   Past Surgical History:  Procedure Laterality Date  . [place]   x 1  . CHOLECYSTECTOMY  1990  . ORIF ANKLE FRACTURE Right 06/18/2012   Procedure: OPEN REDUCTION INTERNAL FIXATION (ORIF) ANKLE FRACTURE;  Surgeon: Augustin Schooling, MD;  Location: [facility];  Service: Orthopedics;  Laterality: Right;  . TUBAL LIGATION  1990   Family History  Problem Relation Age of Onset  . Breast cancer Maternal Aunt    Social History  Substance Use Topics  . Smoking status: Current Every Day Smoker    Packs/day: 1.00    Years: 20.00    Types: Cigarettes  . Smokeless tobacco: Never Used  . Alcohol use No   OB History    No data available     Review of Systems  Constitutional: Negative.   HENT: Negative.   Respiratory: Negative.   Cardiovascular: Negative.   Gastrointestinal: Negative.   Musculoskeletal:       Left hand swelling  Skin: Positive for rash.  Neurological: Negative.      Allergies  Bee venom and Doxycycline  Home Medications   Prior to Admission medications   Medication Sig Start Date End Date Taking? Authorizing Provider  albuterol (PROVENTIL HFA;VENTOLIN HFA) 108 (90 Base) MCG/ACT inhaler Inhale 2 puffs into the lungs every 4 (four) hours as needed for wheezing or shortness of breath. 04/25/15   Janne Napoleon, NP  Aspirin-Acetaminophen-Caffeine (GOODY HEADACHE PO) Take 1 packet by mouth daily as needed (headache).    [provider]  ferrous sulfate (FERROUSUL) 325 (65 FE) MG tablet Take 1 tablet (325 mg total) by mouth 2 (two) times daily with a meal. 03/23/16   Patrecia Pour, Christean Grief, MD  medroxyPROGESTERone (PROVERA) 10 MG tablet Take 10 mg by mouth See admin instructions. Pt takes one daily for 21 days, then stops for 7 days 07/23/16   [provider]  predniSONE (STERAPRED UNI-PAK 21 TAB) 10 MG (21) TBPK tablet Take 6 tablets tomorrow, decrease by 1 each day till finished (6,5,4,3,2,1) 11/02/16   Barnet Glasgow, NP  pseudoephedrine (SUDAFED) 30 MG tablet Take 30 mg by mouth every 4 (four) hours as needed for congestion.    [provider]   Meds Ordered and Administered this Visit   Medications  methylPREDNISolone acetate (DEPO-MEDROL) injection 80 mg (not administered)  famotidine (PEPCID) tablet 20 mg (not administered)  diphenhydrAMINE (BENADRYL) injection 50 mg (not administered)    BP (!) 151/79 (BP Location: Right Arm)   Pulse 73   Temp 98.3 F (36.8 C) (Oral)   Resp 16   LMP 10/20/2016   SpO2 100%  No data found.   Physical Exam  Constitutional: She is oriented to person, place, and time. She appears well-developed and well-nourished. No distress.  HENT:  Head: Normocephalic and atraumatic.  Right Ear: External ear normal.  Left Ear: External ear normal.  Neck: Normal range of motion.  Cardiovascular: Normal rate and regular rhythm.   Pulmonary/Chest: Effort normal and breath sounds normal. She has no  wheezes.  Abdominal: Soft. There is no tenderness.  Musculoskeletal: She exhibits edema and tenderness.  Left-handed emesis, capillary refill less than 2 seconds, able to form a grip, sensory function remains intact. No red streaking noted.  Neurological: She is alert and oriented to person, place, and time.  Skin: Skin is warm and dry. Capillary refill takes less than 2 seconds. No rash noted. She is not diaphoretic. No erythema.  Psychiatric: She has a normal mood and affect. Her behavior is normal.  Nursing note and vitals reviewed.   Urgent Care Course     Procedures (including critical care time)  Labs Review Labs Reviewed - No data to display  Imaging Review No results found.    MDM   1. Hornet sting, accidental or unintentional, initial encounter    Treating with Benadryl, Prednisone, and pepcid, follow up with PCP or return to clinic as needed.    Barnet Glasgow, NP 11/02/16 1420

## 2016-11-02 NOTE — Discharge Instructions (Signed)
For your hand, do over-the-counter Benadryl, take 2 tablets or 50 mg every 6 hours for the next 2 days, I prescribed a Medrol Dosepak, take as directed. If you see any red streaking up your arms, marked with a pen, and then go to the emergency room. Your symptoms should start to resolve quickly. If not, follow up with her primary care provider in one week, or sooner if needed.

## 2016-11-02 NOTE — ED Triage Notes (Signed)
Pt reports she was stung by a yellow jacket yesterday to left hand  Sx include: swelling, redness  Reports she was wearing gloves while doing yard work  Denies dysphagia, dyspnea  Speaking in complete sentences ... A&O x4... NAD

## 2017-05-02 ENCOUNTER — Other Ambulatory Visit: Payer: Self-pay | Admitting: Obstetrics and Gynecology

## 2017-05-02 MED ORDER — MEDROXYPROGESTERONE ACETATE 10 MG PO TABS: 10.0000 mg | ORAL_TABLET | ORAL | 6 refills | Status: DC

## 2017-05-03 ENCOUNTER — Telehealth: Payer: Self-pay | Admitting: *Deleted

## 2017-05-03 ENCOUNTER — Telehealth: Payer: Self-pay | Admitting: General Practice

## 2017-05-03 NOTE — Telephone Encounter (Addendum)
-----   Message from Mora Bellman, MD sent at 05/02/2017 12:40 PM EST ----- Regarding: RE: refill Provera Refill e-prescribed for 6 months. Patient should evaluate her periods to see if it is still needed. If she feels that it is, she needs to come the office for a check in within the next 3 months.  ----- Message ----- From: Naziya Hegwood, Ronnell Freshwater, RN Sent: 05/02/2017  11:32 AM To: Mora Bellman, MD Subject: refill Provera                                 Pt is requesting refill of Provera. She was seen once in office on 04/11/16.  Please advise.   1/11  1320  Called pt to discuss refill of Provera.  She confirmed that she did request the refill vis her pharmacy. I stated that Dr. Elly Modena has sent refill Rx to her pharmacy she would like pt to evaluate her periods and if she feels the medication is still necessary, would like to see her for office appt within 3 months.  Pt states that medication is helpful and agrees to office appt. Pt advised that she will be called with appt information.

## 2017-05-03 NOTE — Telephone Encounter (Signed)
Called patient with follow up appointment with Dr. Elly Modena on 07/03/17 at 1:55pm.  Appt reminder will be mailed as well.

## 2017-07-03 ENCOUNTER — Ambulatory Visit: Payer: Self-pay | Admitting: Obstetrics and Gynecology

## 2017-08-08 ENCOUNTER — Ambulatory Visit: Payer: Self-pay | Admitting: Obstetrics and Gynecology

## 2017-10-24 ENCOUNTER — Emergency Department (HOSPITAL_COMMUNITY): Payer: Self-pay

## 2017-10-24 ENCOUNTER — Emergency Department (HOSPITAL_COMMUNITY)
Admission: EM | Admit: 2017-10-24 | Discharge: 2017-10-24 | Disposition: A | Discharge status: Home / Self Care | Payer: Self-pay | Attending: Emergency Medicine | Admitting: Emergency Medicine

## 2017-10-24 ENCOUNTER — Encounter (HOSPITAL_COMMUNITY): Payer: Self-pay

## 2017-10-24 ENCOUNTER — Other Ambulatory Visit: Payer: Self-pay

## 2017-10-24 DIAGNOSIS — F17210 Nicotine dependence, cigarettes, uncomplicated: Secondary | ICD-10-CM | POA: Insufficient documentation

## 2017-10-24 DIAGNOSIS — R200 Anesthesia of skin: Secondary | ICD-10-CM

## 2017-10-24 DIAGNOSIS — R202 Paresthesia of skin: Secondary | ICD-10-CM | POA: Insufficient documentation

## 2017-10-24 DIAGNOSIS — I10 Essential (primary) hypertension: Secondary | ICD-10-CM | POA: Insufficient documentation

## 2017-10-24 DIAGNOSIS — Z79899 Other long term (current) drug therapy: Secondary | ICD-10-CM | POA: Insufficient documentation

## 2017-10-24 DIAGNOSIS — R0789 Other chest pain: Secondary | ICD-10-CM | POA: Insufficient documentation

## 2017-10-24 DIAGNOSIS — Z7982 Long term (current) use of aspirin: Secondary | ICD-10-CM | POA: Insufficient documentation

## 2017-10-24 LAB — CBC WITH DIFFERENTIAL/PLATELET
Basophils Absolute: 0.1 10*3/uL (ref 0.0–0.1)
Basophils Relative: 1 %
Eosinophils Absolute: 0.2 10*3/uL (ref 0.0–0.7)
Eosinophils Relative: 2 %
HCT: 33.1 % — ABNORMAL LOW (ref 36.0–46.0)
Hemoglobin: 9 g/dL — ABNORMAL LOW (ref 12.0–15.0)
Lymphocytes Relative: 26 %
Lymphs Abs: 2.1 10*3/uL (ref 0.7–4.0)
MCH: 18.3 pg — ABNORMAL LOW (ref 26.0–34.0)
MCHC: 27.2 g/dL — ABNORMAL LOW (ref 30.0–36.0)
MCV: 67.1 fL — ABNORMAL LOW (ref 78.0–100.0)
Monocytes Absolute: 0.6 10*3/uL (ref 0.1–1.0)
Monocytes Relative: 7 %
Neutro Abs: 4.9 10*3/uL (ref 1.7–7.7)
Neutrophils Relative %: 64 %
Platelets: 276 10*3/uL (ref 150–400)
RBC: 4.93 MIL/uL (ref 3.87–5.11)
RDW: 22.3 % — ABNORMAL HIGH (ref 11.5–15.5)
WBC: 7.9 10*3/uL (ref 4.0–10.5)

## 2017-10-24 LAB — BASIC METABOLIC PANEL
Anion gap: 6 (ref 5–15)
BUN: 9 mg/dL (ref 6–20)
CO2: 27 mmol/L (ref 22–32)
Calcium: 8.7 mg/dL — ABNORMAL LOW (ref 8.9–10.3)
Chloride: 108 mmol/L (ref 98–111)
Creatinine, Ser: 0.71 mg/dL (ref 0.44–1.00)
GFR calc Af Amer: >60 mL/min (ref >60)
GFR calc non Af Amer: >60 mL/min (ref >60)
Glucose, Bld: 88 mg/dL (ref 70–99)
Potassium: 3.7 mmol/L (ref 3.5–5.1)
Sodium: 141 mmol/L (ref 135–145)

## 2017-10-24 LAB — TROPONIN I: Troponin I: <0.03 ng/mL (ref <0.03)

## 2017-10-24 MED ORDER — FERROUS SULFATE 325 (65 FE) MG PO TABS: 325.0000 mg | ORAL_TABLET | Freq: Two times a day (BID) | ORAL | 0 refills | Status: DC

## 2017-10-24 NOTE — ED Provider Notes (Signed)
[facility] DEPT Provider Note   CSN: [id number] Arrival date & time: 10/24/17  1047     History   Chief Complaint Chief Complaint  Patient presents with  . Tingling    HPI Kimberly Jefferson is a 52 y.o. female  With history of anxiety, heavy menstrual bleeding, hypertension, and anemia presents for evaluation of acute onset, intermittent numbness and tingling of the bilateral upper extremities and intermittent chest pains for 2 weeks.  She states that her fingertips will become numb, right worse than left.  This will improve with rest and typically only lasts a few minutes.  She will sometimes get numbness and tingling of her lips as well.  She also notes intermittent sharp stabbing pains of the left side of the chest which will typically last only a few seconds before resolving entirely.  She states that the symptoms  typically occur when she feels stressed.  She does not think that the pain is exertional.  The pain is not pleuritic.  She also notes intermittent lightheadedness which will last for a few seconds at a time.  This typically occurs when she changes positions from laying or seated to standing but sometimes also occurs at rest while she is watching television.  She denies weakness, fevers, chills, shortness of breath, cough, nausea, vomiting, diaphoresis, or abdominal pain.  She is a current smoker of approximately pack of cigarettes daily.  She denies recreational drug use or excessive alcohol or caffeine intake. No complaints suggestive of DVT/PE, including leg swellinh, hemoptysis, recent travel/surgeries, history of cancer, or OCP/estrogen therapy.   The history is provided by the patient.    Past Medical History:  Diagnosis Date  . Anxiety    no meds  . Dysmenorrhea   . Heart murmur    "nothing to worry about" dx child, no problems ever  . History of blood transfusion  2013   heavy periods, MC 2 units transfuesd  . HTN (hypertension)  03/12/2012   one time no med  . Hypertension    one time  . Pneumonia     hx "had it once a year for a few years"  last time was 3- 4 years ago.  . SVD (spontaneous vaginal delivery)    x 2    Patient Active Problem List   Diagnosis Date Noted  . Vitamin B12 deficiency anemia 03/22/2016  . Heavy menstrual bleeding 03/22/2016  . URI (upper respiratory infection) 03/22/2016  . Folate deficiency 03/22/2016  . Uterine fibroid 03/22/2016  . Increased endometrial stripe 03/22/2016  . Ankle fracture, right 06/18/2012  . Hypokalemia 03/13/2012  . Chest pain 03/12/2012  . HTN (hypertension) 03/12/2012  . Trichomonas 03/12/2012  . Left sided numbness 03/11/2012  . Syncope 03/11/2012  . Anemia 03/11/2012    Past Surgical History:  Procedure Laterality Date  . [place]   x 1  . CHOLECYSTECTOMY  1990  . ORIF ANKLE FRACTURE Right 06/18/2012   Procedure: OPEN REDUCTION INTERNAL FIXATION (ORIF) ANKLE FRACTURE;  Surgeon: Augustin Schooling, MD;  Location: [facility];  Service: Orthopedics;  Laterality: Right;  . TUBAL LIGATION  1990     OB History   None      Home Medications    Prior to Admission medications   Medication Sig Start Date End Date Taking? Authorizing Provider  aspirin 325 MG tablet Take 325 mg by mouth every 4 (four) hours as needed for moderate pain.   Yes [provider]  albuterol (PROVENTIL HFA;VENTOLIN HFA) 108 (90 Base) MCG/ACT inhaler Inhale 2 puffs into the lungs every 4 (four) hours as needed for wheezing or shortness of breath. Patient not taking: Reported on 10/24/2017 04/25/15   Janne Napoleon, NP  ferrous sulfate (FERROUSUL) 325 (65 FE) MG tablet Take 1 tablet (325 mg total) by mouth 2 (two) times daily with a meal. 10/24/17   Theoplis Garciagarcia A, PA-C  medroxyPROGESTERone (PROVERA) 10 MG tablet Take 1 tablet (10 mg total) by mouth See admin instructions. Pt takes one daily for 21 days, then stops for 7 days Patient not taking: Reported on 10/24/2017  05/02/17   Constant, Peggy, MD  predniSONE (STERAPRED UNI-PAK 21 TAB) 10 MG (21) TBPK tablet Take 6 tablets tomorrow, decrease by 1 each day till finished (3,6,6,4,4,0) Patient not taking: Reported on 10/24/2017 11/02/16   Barnet Glasgow, NP    Family History Family History  Problem Relation Age of Onset  . Breast cancer Maternal Aunt     Social History Social History   Tobacco Use  . Smoking status: Current Every Day Smoker    Packs/day: 1.00    Years: 20.00    Pack years: 20.00    Types: Cigarettes  . Smokeless tobacco: Never Used  Substance Use Topics  . Alcohol use: No  . Drug use: No     Allergies   Bee venom and Doxycycline   Review of Systems Review of Systems  Constitutional: Negative for chills and fever.  Eyes: Negative for photophobia and visual disturbance.  Respiratory: Negative for shortness of breath.   Cardiovascular: Positive for chest pain.  Gastrointestinal: Negative for abdominal pain, nausea and vomiting.  Neurological: Positive for light-headedness and numbness. Negative for syncope and weakness.  All other systems reviewed and are negative.    Physical Exam Updated Vital Signs BP 134/74   Pulse (!) 51   Temp 98.2 F (36.8 C) (Oral)   Resp (!) 21   Ht 5\' 6"  (1.676 m)   Wt 74.8 kg (165 lb)   LMP 10/10/2017   SpO2 99%   BMI 26.63 kg/m   Physical Exam  Constitutional: She is oriented to person, place, and time. She appears well-developed and well-nourished. No distress.  HENT:  Head: Normocephalic and atraumatic.  Eyes: Pupils are equal, round, and reactive to light. Conjunctivae and EOM are normal. Right eye exhibits no discharge. Left eye exhibits no discharge.  Neck: Normal range of motion. Neck supple. No JVD present. No tracheal deviation present.  Cardiovascular: Normal rate, regular rhythm, normal heart sounds and intact distal pulses.  2+ radial and DP/PT pulses bilaterally, Homans sign absent bilaterally, no lower extremity  edema, no palpable cords, compartments are soft   Pulmonary/Chest: Effort normal and breath sounds normal. No stridor. No respiratory distress. She has no wheezes. She exhibits tenderness.  Mild tenderness to palpation of the left anterior and lateral chest wall with no deformity, crepitus, ecchymosis, or flail segment noted.  Abdominal: Soft. Bowel sounds are normal. She exhibits no distension. There is no tenderness. There is no guarding.  Musculoskeletal: She exhibits no edema.  Neurological: She is alert and oriented to person, place, and time. No cranial nerve deficit or sensory deficit. She exhibits normal muscle tone.  Mental Status:  Alert, thought content appropriate, able to give a coherent history. Speech fluent without evidence of aphasia. Able to follow 2 step commands without difficulty.  Cranial Nerves:  II:  Peripheral visual fields grossly normal, pupils equal, round, reactive to light III,IV,  VI: ptosis not present, extra-ocular motions intact bilaterally  V,VII: smile symmetric, facial light touch sensation equal VIII: hearing grossly normal to voice  X: uvula elevates symmetrically  XI: bilateral shoulder shrug symmetric and strong XII: midline tongue extension without fassiculations Motor:  Normal tone. 5/5 strength of BUE and BLE major muscle groups including strong and equal grip strength and dorsiflexion/plantar flexion Sensory: light touch normal in all extremities. Cerebellar: normal finger-to-nose with bilateral upper extremities Gait: normal gait and balance. Able to walk on toes and heels with ease.  CV: 2+ radial and DP/PT pulses No pronator drift, no nystagmus, Romberg sign absent.  Skin: Skin is warm and dry. No erythema.  Psychiatric: She has a normal mood and affect. Her behavior is normal.  Nursing note and vitals reviewed.    ED Treatments / Results  Labs (all labs ordered are listed, but only abnormal results are displayed) Labs Reviewed  BASIC  METABOLIC PANEL - Abnormal; Notable for the following components:      Result Value   Calcium 8.7 (*)    All other components within normal limits  CBC WITH DIFFERENTIAL/PLATELET - Abnormal; Notable for the following components:   Hemoglobin 9.0 (*)    HCT 33.1 (*)    MCV 67.1 (*)    MCH 18.3 (*)    MCHC 27.2 (*)    RDW 22.3 (*)    All other components within normal limits  TROPONIN I    EKG EKG Interpretation  Date/Time:  Thursday October 24 2017 11:41:04 EDT Ventricular Rate:  61 PR Interval:    QRS Duration: 80 QT Interval:  438 QTC Calculation: 442 R Axis:   63 Text Interpretation:  Sinus rhythm Probable left ventricular hypertrophy Borderline T abnormalities, inferior leads Baseline wander in lead(s) I III aVL V1 No significant change since last tracing Confirmed by Fredia Sorrow [id number]) on 10/24/2017 11:59:44 AM Also confirmed by Fredia Sorrow [phone]), editor Lynder Parents [phone])  on 10/24/2017 12:35:57 PM   Radiology Dg Chest 2 View  Result Date: 10/24/2017 CLINICAL DATA:  Left-sided chest pain for 2 weeks EXAM: CHEST - 2 VIEW COMPARISON:  08/18/2016 FINDINGS: The heart size and mediastinal contours are within normal limits. Both lungs are clear. The visualized skeletal structures are unremarkable. IMPRESSION: No active cardiopulmonary disease. Electronically Signed   By: Inez Catalina M.D.   On: 10/24/2017 12:49    Procedures Procedures (including critical care time)  Medications Ordered in ED Medications - No data to display   Initial Impression / Assessment and Plan / ED Course  I have reviewed the triage vital signs and the nursing notes.  Pertinent labs & imaging results that were available during my care of the patient were reviewed by me and considered in my medical decision making (see chart for details).     Patient with complaints of intermittent left-sided chest pains, bilateral tingling of both hands, right worse than left, and lightheadedness for 2  weeks.  She is afebrile, vital signs are stable.  She is nontoxic in appearance.  Chart review shows that she has presented for similar complaints in the past and work-up was negative.  Lab work shows anemia is at her baseline, no significant electrolyte abnormalities, no leukocytosis.  EKG shows no acute changes from last tracing.  Troponin is negative and pain is reproducible on palpation.  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  Chest x-ray shows no acute cardiopulmonary abnormalities.  No evidence of pneumonia, pleural effusion, pericarditis, myocarditis, or dissection. HEART score is 3.  Doubt ACS/MI.  Doubt PE in the absence of risk factors and Wells' score is 0. No focal neurologic deficits and patient is ambulatory without difficulty. Doubt CVA, ICH, SAH, or other concerning acute intracranial abnormality.  She states that she is feeling at her baseline at this time.  I do not see need for emergent head imaging at this time.  She does attribute many of her symptoms to stress and anxiety.  Stable for discharge home with follow-up with her PCP and a cardiologist for reevaluation of her symptoms.  Discussed strict ED return precautions. Pt and her husband verbalized understanding of and agreement with plan and patient is safe for discharge home at this time.   Final Clinical Impressions(s) / ED Diagnoses   Final diagnoses:  Atypical chest pain  Numbness and tingling    ED Discharge Orders        Ordered    ferrous sulfate (FERROUSUL) 325 (65 FE) MG tablet  2 times daily with meals     10/24/17 1320       Briselda Naval, [place] A, PA-C 10/25/17 0615    Fredia Sorrow, MD 10/25/17 [phone]

## 2017-10-24 NOTE — ED Triage Notes (Addendum)
Patient reports that she has been having intermittent tingling of fingers on both hands, R>L. Patient also c/o tingling on top of her head as well. Patient also reports intermittent light headedness when she stands up too fast.

## 2017-10-24 NOTE — Discharge Instructions (Addendum)
Take your iron supplements as prescribed.  Drink plenty water and get plenty of rest.  Try to quit smoking.  Follow-up with your primary care physician for reevaluation of your symptoms.  He may also find it beneficial to follow-up with a cardiologist, I have attached information for their office which you can call to set up follow-up appointment.  Return to the emergency department if any concerning signs or symptoms develop such as slurred speech, severe headaches, passing out, persistent lightheadedness, chest pain, shortness of breath, or vomiting.

## 2017-12-17 ENCOUNTER — Encounter (HOSPITAL_COMMUNITY): Payer: Self-pay | Admitting: Emergency Medicine

## 2017-12-17 ENCOUNTER — Other Ambulatory Visit: Payer: Self-pay

## 2017-12-17 ENCOUNTER — Ambulatory Visit (HOSPITAL_COMMUNITY)
Admission: EM | Admit: 2017-12-17 | Discharge: 2017-12-17 | Disposition: A | Discharge status: Home / Self Care | Payer: Self-pay | Attending: Family Medicine | Admitting: Family Medicine

## 2017-12-17 ENCOUNTER — Encounter (HOSPITAL_COMMUNITY): Payer: Self-pay | Admitting: Physician Assistant

## 2017-12-17 ENCOUNTER — Emergency Department (HOSPITAL_COMMUNITY): Payer: Self-pay

## 2017-12-17 ENCOUNTER — Emergency Department (HOSPITAL_COMMUNITY)
Admission: EM | Admit: 2017-12-17 | Discharge: 2017-12-17 | Disposition: A | Discharge status: Home / Self Care | Payer: Self-pay | Attending: Emergency Medicine | Admitting: Emergency Medicine

## 2017-12-17 DIAGNOSIS — F17210 Nicotine dependence, cigarettes, uncomplicated: Secondary | ICD-10-CM | POA: Insufficient documentation

## 2017-12-17 DIAGNOSIS — N920 Excessive and frequent menstruation with regular cycle: Secondary | ICD-10-CM

## 2017-12-17 DIAGNOSIS — N946 Dysmenorrhea, unspecified: Secondary | ICD-10-CM

## 2017-12-17 DIAGNOSIS — R1032 Left lower quadrant pain: Secondary | ICD-10-CM

## 2017-12-17 DIAGNOSIS — Z79899 Other long term (current) drug therapy: Secondary | ICD-10-CM | POA: Insufficient documentation

## 2017-12-17 DIAGNOSIS — I10 Essential (primary) hypertension: Secondary | ICD-10-CM | POA: Insufficient documentation

## 2017-12-17 DIAGNOSIS — N938 Other specified abnormal uterine and vaginal bleeding: Secondary | ICD-10-CM

## 2017-12-17 DIAGNOSIS — D500 Iron deficiency anemia secondary to blood loss (chronic): Secondary | ICD-10-CM

## 2017-12-17 DIAGNOSIS — D259 Leiomyoma of uterus, unspecified: Secondary | ICD-10-CM

## 2017-12-17 LAB — CBC
HEMATOCRIT: 28.1 % — AB (ref 36.0–46.0)
HEMOGLOBIN: 8.2 g/dL — AB (ref 12.0–15.0)
MCH: 23.1 pg — AB (ref 26.0–34.0)
MCHC: 29.2 g/dL — AB (ref 30.0–36.0)
MCV: 79.2 fL (ref 78.0–100.0)
Platelets: 326 10*3/uL (ref 150–400)
RBC: 3.55 MIL/uL — ABNORMAL LOW (ref 3.87–5.11)
RDW: 26.3 % — ABNORMAL HIGH (ref 11.5–15.5)
WBC: 12.8 10*3/uL — ABNORMAL HIGH (ref 4.0–10.5)

## 2017-12-17 LAB — URINALYSIS, ROUTINE W REFLEX MICROSCOPIC
Bilirubin Urine: NEGATIVE
GLUCOSE, UA: NEGATIVE mg/dL
HGB URINE DIPSTICK: NEGATIVE
Ketones, ur: NEGATIVE mg/dL
Leukocytes, UA: NEGATIVE
Nitrite: NEGATIVE
Protein, ur: NEGATIVE mg/dL
SPECIFIC GRAVITY, URINE: 1.006 (ref 1.005–1.030)
pH: 6 (ref 5.0–8.0)

## 2017-12-17 LAB — HEMOGLOBIN AND HEMATOCRIT, BLOOD
HCT: 26.8 % — ABNORMAL LOW (ref 36.0–46.0)
Hemoglobin: 7.5 g/dL — ABNORMAL LOW (ref 12.0–15.0)

## 2017-12-17 LAB — POCT I-STAT, CHEM 8
BUN: 8 mg/dL (ref 6–20)
Calcium, Ion: 1.19 mmol/L (ref 1.15–1.40)
Chloride: 101 mmol/L (ref 98–111)
Creatinine, Ser: 0.8 mg/dL (ref 0.44–1.00)
Glucose, Bld: 99 mg/dL (ref 70–99)
HEMATOCRIT: 28 % — AB (ref 36.0–46.0)
HEMOGLOBIN: 9.5 g/dL — AB (ref 12.0–15.0)
POTASSIUM: 4 mmol/L (ref 3.5–5.1)
SODIUM: 137 mmol/L (ref 135–145)
TCO2: 25 mmol/L (ref 22–32)

## 2017-12-17 LAB — COMPREHENSIVE METABOLIC PANEL
ALBUMIN: 3.7 g/dL (ref 3.5–5.0)
ALT: 14 U/L (ref 0–44)
ANION GAP: 6 (ref 5–15)
AST: 17 U/L (ref 15–41)
Alkaline Phosphatase: 65 U/L (ref 38–126)
BUN: 8 mg/dL (ref 6–20)
CO2: 27 mmol/L (ref 22–32)
Calcium: 8.8 mg/dL — ABNORMAL LOW (ref 8.9–10.3)
Chloride: 104 mmol/L (ref 98–111)
Creatinine, Ser: 0.78 mg/dL (ref 0.44–1.00)
GFR calc Af Amer: >60 mL/min (ref >60)
GFR calc non Af Amer: >60 mL/min (ref >60)
GLUCOSE: 97 mg/dL (ref 70–99)
POTASSIUM: 4.3 mmol/L (ref 3.5–5.1)
SODIUM: 137 mmol/L (ref 135–145)
Total Bilirubin: 0.8 mg/dL (ref 0.3–1.2)
Total Protein: 6.4 g/dL — ABNORMAL LOW (ref 6.5–8.1)

## 2017-12-17 LAB — LIPASE, BLOOD: Lipase: 27 U/L (ref 11–51)

## 2017-12-17 LAB — POC URINE PREG, ED: Preg Test, Ur: NEGATIVE

## 2017-12-17 LAB — WET PREP, GENITAL
Clue Cells Wet Prep HPF POC: NONE SEEN
SPERM: NONE SEEN
Trich, Wet Prep: NONE SEEN
YEAST WET PREP: NONE SEEN

## 2017-12-17 LAB — TYPE AND SCREEN
ABO/RH(D): O POS
Antibody Screen: NEGATIVE

## 2017-12-17 MED ORDER — MEDROXYPROGESTERONE ACETATE 10 MG PO TABS: 10.0000 mg | ORAL_TABLET | ORAL | 0 refills | Status: DC

## 2017-12-17 MED ORDER — MEGESTROL ACETATE 40 MG PO TABS: 80.0000 mg | ORAL_TABLET | Freq: Three times a day (TID) | ORAL | 0 refills | Status: DC | PRN

## 2017-12-17 MED ORDER — HYDROCODONE-ACETAMINOPHEN 5-325 MG PO TABS: ORAL_TABLET | ORAL | 0 refills | Status: DC

## 2017-12-17 MED ORDER — ALBUTEROL SULFATE HFA 108 (90 BASE) MCG/ACT IN AERS: 2.0000 | INHALATION_SPRAY | RESPIRATORY_TRACT | 0 refills | Status: DC | PRN

## 2017-12-17 MED ORDER — ONDANSETRON HCL 4 MG/2ML IJ SOLN
4.0000 mg | Freq: Once | INTRAMUSCULAR | Status: AC
  Administered 2017-12-17: 4 mg via INTRAVENOUS
  Filled 2017-12-17: qty 2

## 2017-12-17 MED ORDER — MEGESTROL ACETATE 40 MG PO TABS
120.0000 mg | ORAL_TABLET | Freq: Once | ORAL | Status: AC
  Administered 2017-12-17: 120 mg via ORAL
  Filled 2017-12-17: qty 3

## 2017-12-17 MED ORDER — HYDROMORPHONE HCL 1 MG/ML IJ SOLN
0.5000 mg | Freq: Once | INTRAMUSCULAR | Status: AC
  Administered 2017-12-17: 0.5 mg via INTRAVENOUS
  Filled 2017-12-17: qty 1

## 2017-12-17 MED ORDER — HYDROMORPHONE HCL 1 MG/ML IJ SOLN
1.0000 mg | Freq: Once | INTRAMUSCULAR | Status: AC
  Administered 2017-12-17: 1 mg via INTRAVENOUS
  Filled 2017-12-17: qty 1

## 2017-12-17 NOTE — ED Notes (Signed)
Patient is unable to void at this time 

## 2017-12-17 NOTE — ED Provider Notes (Signed)
  Physical Exam  BP (!) 128/57 (BP Location: Right Arm)   Pulse (!) 54   Temp 98.3 F (36.8 C) (Oral)   Resp 20   LMP 12/10/2017 Comment: bleeding stopped for 2 months, then excessive bleeding for past 7 days  SpO2 100%   Physical Exam   Gen: in NAD CV: RRR Skin: no pallor  ED Course/Procedures       MDM   Pt signed out to me by Alvera Singh, PA-C.  Please see previous notes for further history.  In brief, patient presenting for evaluation of vaginal bleeding.  Has been having heavy bleeding for the past 7 to 10 days.  Has needed transfusions in the past.  Pelvic exam showed clots and vaginal vault full of blood.  Hemoglobin in July was 9, today is 8.2.  Repeat pending.  If stable and vitals remained stable, plan for discharge.  Repeat hemoglobin down to 7.5.  Will consult with GYN to see if they recommend observation for hemoglobin trending versus discharge and close follow-up.  Discussed with OB/GYN who recommends high-dose Megace here in the ER at 120 mg, and discharged on Megace 80 mg 3 times a day as needed until vaginal bleeding stops.  Recommends close outpatient follow-up, and return to ER symptoms worsen.  Discussed with patient, who is agreeable to plan.  Vitals remained stable, and she reports symptoms are improving.  At this time, patient appears safe for discharge.  Strict return precautions given.  Patient states she understands and agrees to plan.     Franchot Heidelberg, PA-C 12/17/17 1734    Dorie Rank, MD 12/18/17 [phone]

## 2017-12-17 NOTE — ED Notes (Signed)
Lower abd pain and heavy vaginal bleeding x7 days with large amount of clots noted per pt; states did not have a period x2 months then began bleeding - initially light x2 days then turned into heavy amt

## 2017-12-17 NOTE — ED Notes (Signed)
Transported to [place] via stretcher per rad tech

## 2017-12-17 NOTE — ED Provider Notes (Signed)
52 year old female with history of DUB requiring blood transfusion, dysmenorrhea, HTN, anxiety comes in for evaluation of 7-day history of excessive vaginal bleeding and lower abdominal pain.  Patient states "I have severe abdominal pain to the lower abdomen".  Patient states symptoms are not consistent with her normal dysmenorrhea.  States she would like to find out what is going on, and would like further testing.  Discussed with patient, does not have resources at the urgent care for abdominal imaging, can check i-STAT, and urine for further evaluation.  Patient would like more evaluation, "I want everything checked". Discussed with patient emergency department will be able to rule out emergency conditions, and may need further follow up with her PCP/GYN. Patient expresses understanding and would like to go to the emergency department. Discharged in stable condition to the emergency department for further evaluation.   Ok Edwards, PA-C 12/17/17 1023

## 2017-12-17 NOTE — ED Notes (Signed)
Patient verbalizes understanding of discharge instructions. Opportunity for questioning and answers were provided. Armband removed by staff, pt discharged from ED ambulatory.   

## 2017-12-17 NOTE — ED Provider Notes (Signed)
[facility] EMERGENCY DEPARTMENT Provider Note   CSN: [id number] Arrival date & time: 12/17/17  1032     History   Chief Complaint Chief Complaint  Patient presents with  . Vaginal Bleeding    HPI Kimberly Jefferson is a 52 y.o. female.  Patient with history of dysfunctional uterine bleeding, no previous pelvic surgeries presents to the emergency department today with very heavy vaginal bleeding over the past 10 to 14 days.  Patient also has left lower quadrant abdominal pain with this.  She reports going through a package of pads per day and passing clots.  She denies any fevers, nausea, vomiting.  No urinary symptoms.  She has required blood transfusion in the past.  She reports not having a menstrual period for the past 2 months.  She has had some lightheadedness but no syncope.  She denies shortness of breath.  No treatments prior to arrival. The onset of this condition was acute. The course is constant. Aggravating factors: none. Alleviating factors: none.       Past Medical History:  Diagnosis Date  . Anxiety    no meds  . Dysmenorrhea   . Heart murmur    "nothing to worry about" dx child, no problems ever  . History of blood transfusion  2013   heavy periods, MC 2 units transfuesd  . HTN (hypertension) 03/12/2012   one time no med  . Hypertension    one time  . Pneumonia     hx "had it once a year for a few years"  last time was 3- 4 years ago.  . SVD (spontaneous vaginal delivery)    x 2    Patient Active Problem List   Diagnosis Date Noted  . Vitamin B12 deficiency anemia 03/22/2016  . Heavy menstrual bleeding 03/22/2016  . URI (upper respiratory infection) 03/22/2016  . Folate deficiency 03/22/2016  . Uterine fibroid 03/22/2016  . Increased endometrial stripe 03/22/2016  . Ankle fracture, right 06/18/2012  . Hypokalemia 03/13/2012  . Chest pain 03/12/2012  . HTN (hypertension) 03/12/2012  . Trichomonas 03/12/2012  . Left sided numbness  03/11/2012  . Syncope 03/11/2012  . Anemia 03/11/2012    Past Surgical History:  Procedure Laterality Date  . [place]   x 1  . CHOLECYSTECTOMY  1990  . ORIF ANKLE FRACTURE Right 06/18/2012   Procedure: OPEN REDUCTION INTERNAL FIXATION (ORIF) ANKLE FRACTURE;  Surgeon: Augustin Schooling, MD;  Location: [facility];  Service: Orthopedics;  Laterality: Right;  . TUBAL LIGATION  1990     OB History   None      Home Medications    Prior to Admission medications   Medication Sig Start Date End Date Taking? Authorizing Provider  albuterol (PROVENTIL HFA;VENTOLIN HFA) 108 (90 Base) MCG/ACT inhaler Inhale 2 puffs into the lungs every 4 (four) hours as needed for wheezing or shortness of breath. Patient not taking: Reported on 10/24/2017 04/25/15   Janne Napoleon, NP  aspirin 325 MG tablet Take 325 mg by mouth every 4 (four) hours as needed for moderate pain.    [provider]  ferrous sulfate (FERROUSUL) 325 (65 FE) MG tablet Take 1 tablet (325 mg total) by mouth 2 (two) times daily with a meal. 10/24/17   Fawze, Mina A, PA-C  medroxyPROGESTERone (PROVERA) 10 MG tablet Take 1 tablet (10 mg total) by mouth See admin instructions. Pt takes one daily for 21 days, then stops for 7 days Patient not taking:  Reported on 10/24/2017 05/02/17   Constant, Peggy, MD  predniSONE (STERAPRED UNI-PAK 21 TAB) 10 MG (21) TBPK tablet Take 6 tablets tomorrow, decrease by 1 each day till finished (6,5,7,8,4,6) Patient not taking: Reported on 10/24/2017 11/02/16   Barnet Glasgow, NP    Family History Family History  Problem Relation Age of Onset  . Breast cancer Maternal Aunt     Social History Social History   Tobacco Use  . Smoking status: Current Every Day Smoker    Packs/day: 1.00    Years: 20.00    Pack years: 20.00    Types: Cigarettes  . Smokeless tobacco: Never Used  Substance Use Topics  . Alcohol use: No  . Drug use: No     Allergies   Bee venom and Doxycycline   Review of  Systems Review of Systems  Constitutional: Negative for fever.  HENT: Negative for rhinorrhea and sore throat.   Eyes: Negative for redness.  Respiratory: Negative for cough.   Cardiovascular: Negative for chest pain.  Gastrointestinal: Positive for abdominal pain. Negative for diarrhea, nausea and vomiting.  Genitourinary: Positive for vaginal bleeding. Negative for dysuria and hematuria.  Musculoskeletal: Negative for myalgias.  Skin: Negative for rash.  Neurological: Positive for light-headedness. Negative for headaches.     Physical Exam Updated Vital Signs BP 115/60 (BP Location: Right Arm)   Pulse 77   Temp 98.3 F (36.8 C) (Oral)   Resp 19   LMP 12/10/2017 Comment: bleeding stopped for 2 months, then excessive bleeding for past 7 days  SpO2 100%   Physical Exam  Constitutional: She appears well-developed and well-nourished.  HENT:  Head: Normocephalic and atraumatic.  Mouth/Throat: Oropharynx is clear and moist.  Eyes: Conjunctivae are normal. Right eye exhibits no discharge. Left eye exhibits no discharge.  Neck: Normal range of motion. Neck supple.  Cardiovascular: Normal rate, regular rhythm and normal heart sounds.  Pulmonary/Chest: Effort normal and breath sounds normal. No respiratory distress. She has no wheezes. She has no rales.  Abdominal: Soft. There is tenderness (bilateral lower abd pain, worse LLQ). There is no rebound and no guarding.  Genitourinary: Pelvic exam was performed with patient supine. Uterus is tender. Cervix exhibits no motion tenderness. Right adnexum displays no mass and no tenderness. Left adnexum displays tenderness. Left adnexum displays no mass. There is bleeding (some clots, moderate blood in vaginal vault) in the vagina. No signs of injury around the vagina. No vaginal discharge found.  Neurological: She is alert.  Skin: Skin is warm and dry.  Psychiatric: She has a normal mood and affect.  Nursing note and vitals reviewed.    ED  Treatments / Results  Labs (all labs ordered are listed, but only abnormal results are displayed) Labs Reviewed  WET PREP, GENITAL - Abnormal; Notable for the following components:      Result Value   WBC, Wet Prep HPF POC MODERATE (*)    All other components within normal limits  COMPREHENSIVE METABOLIC PANEL - Abnormal; Notable for the following components:   Calcium 8.8 (*)    Total Protein 6.4 (*)    All other components within normal limits  CBC - Abnormal; Notable for the following components:   WBC 12.8 (*)    RBC 3.55 (*)    Hemoglobin 8.2 (*)    HCT 28.1 (*)    MCH 23.1 (*)    MCHC 29.2 (*)    RDW 26.3 (*)    All other components within normal limits  URINALYSIS,  ROUTINE W REFLEX MICROSCOPIC - Abnormal; Notable for the following components:   Color, Urine STRAW (*)    All other components within normal limits  LIPASE, BLOOD  RPR  HIV ANTIBODY (ROUTINE TESTING)  HEMOGLOBIN AND HEMATOCRIT, BLOOD  POC URINE PREG, ED  TYPE AND SCREEN    EKG None  Radiology US Transvaginal Non-ob  Result Date: 12/17/2017 CLINICAL DATA:  52 year old female with prolonged vaginal bleeding. Associated left lower quadrant pain. LMP 12/07/2017. EXAM: TRANSABDOMINAL AND TRANSVAGINAL ULTRASOUND OF PELVIS TECHNIQUE: Both transabdominal and transvaginal ultrasound examinations of the pelvis were performed. Transabdominal technique was performed for global imaging of the pelvis including uterus, ovaries, adnexal regions, and pelvic cul-de-sac. It was necessary to proceed with endovaginal exam following the transabdominal exam to visualize the endometrium and ovaries. COMPARISON:  Pelvis ultrasound 03/22/2016. CT Abdomen and Pelvis 03/11/2013 FINDINGS: Uterus Measurements: 17.0 x 7.7 x 10.8 centimeters. Multiple fundal fibroids, the largest measuring 4.9 centimeters appears intramural versus subserosal on image 67. Endometrium Thickness: Difficult to identify but estimated at 12 millimeters (image 60).  No focal abnormality visualized. Right ovary Measurements: 4.1 x 2.8 x 3.4 centimeters. Simple 2.4 centimeter cyst (image 82, 87). Left ovary Measurements: 2.6 x 1.6 x 2.6 centimeters. Only visible trans abdominally. No abnormality identified. Other findings Trace pelvic free fluid (image 70). IMPRESSION: 1. Chronic fibroid uterus. The largest fundal fibroid now is 4.9 cm diameter. 2. Partially obscured endometrium, estimated at 12 millimeters in thickness. No focal endometrial abnormality identified. 3. Normal left ovary.  Simple 2.4 cm right ovarian cyst. Electronically Signed   By: Genevie Ann M.D.   On: 12/17/2017 15:08   US Pelvis Complete  Result Date: 12/17/2017 CLINICAL DATA:  52 year old female with prolonged vaginal bleeding. Associated left lower quadrant pain. LMP 12/07/2017. EXAM: TRANSABDOMINAL AND TRANSVAGINAL ULTRASOUND OF PELVIS TECHNIQUE: Both transabdominal and transvaginal ultrasound examinations of the pelvis were performed. Transabdominal technique was performed for global imaging of the pelvis including uterus, ovaries, adnexal regions, and pelvic cul-de-sac. It was necessary to proceed with endovaginal exam following the transabdominal exam to visualize the endometrium and ovaries. COMPARISON:  Pelvis ultrasound 03/22/2016. CT Abdomen and Pelvis 03/11/2013 FINDINGS: Uterus Measurements: 17.0 x 7.7 x 10.8 centimeters. Multiple fundal fibroids, the largest measuring 4.9 centimeters appears intramural versus subserosal on image 67. Endometrium Thickness: Difficult to identify but estimated at 12 millimeters (image 60). No focal abnormality visualized. Right ovary Measurements: 4.1 x 2.8 x 3.4 centimeters. Simple 2.4 centimeter cyst (image 82, 87). Left ovary Measurements: 2.6 x 1.6 x 2.6 centimeters. Only visible trans abdominally. No abnormality identified. Other findings Trace pelvic free fluid (image 70). IMPRESSION: 1. Chronic fibroid uterus. The largest fundal fibroid now is 4.9 cm  diameter. 2. Partially obscured endometrium, estimated at 12 millimeters in thickness. No focal endometrial abnormality identified. 3. Normal left ovary.  Simple 2.4 cm right ovarian cyst. Electronically Signed   By: Genevie Ann M.D.   On: 12/17/2017 15:08    Procedures Procedures (including critical care time)  Medications Ordered in ED Medications  HYDROmorphone (DILAUDID) injection 1 mg (1 mg Intravenous Given 12/17/17 1148)  ondansetron (ZOFRAN) injection 4 mg (4 mg Intravenous Given 12/17/17 1147)  HYDROmorphone (DILAUDID) injection 0.5 mg (0.5 mg Intravenous Given 12/17/17 1325)     Initial Impression / Assessment and Plan / ED Course  I have reviewed the triage vital signs and the nursing notes.  Pertinent labs & imaging results that were available during my care of the patient were reviewed by me  and considered in my medical decision making (see chart for details).      Patient seen and examined. Work-up initiated. Medications ordered.   Vital signs reviewed and are as follows: BP 115/60 (BP Location: Right Arm)   Pulse 77   Temp 98.3 F (36.8 C) (Oral)   Resp 19   LMP 12/10/2017 Comment: bleeding stopped for 2 months, then excessive bleeding for past 7 days  SpO2 100%   Plan: pain control, labs, pelvic exam, possible [place]  Hgb 8.2 noted. It was 9.0 on 7/4 when she was seen for CP and numbness symptoms.   1:22 PM Pelvic exam performed. There is a large amount of blood and clotting noted.   4:22 PM reviewed previous hospitalization for similar symptoms, blood transfusion.  Patient with chronic fibroids noted on ultrasound.  No emergent problems noted.  Patient updated on results.  She states that she has been told that she needs a hysterectomy by previous providers.  We will recheck hemoglobin here given bleeding to ensure stability.  Vital signs remained stable and patient is currently symptomatic.  If recheck is stable, plan discharged home on Provera and with some pain medicine  for abdominal pain likely related to uterine fibroids and bleeding in setting of menstruation.   Handoff to [place] PA-C at shift change.   Final Clinical Impressions(s) / ED Diagnoses   Final diagnoses:  Dysfunctional uterine bleeding  Uterine leiomyoma, unspecified location  Chronic blood loss anemia   Uterine bleeding: Patient has had chronic problems with this in the past and previous work-ups including hospitalization for blood transfusion.  Patient with hemoglobin of 8.2 today which is not far off of her baseline (9.0) in July.  She has no tachycardia, is not orthostatic, and is normal pulse ox.  Do not feel that she requires hospitalization at this time if her hemoglobin is stable here.  Bleeding noted on exam.  Uterine fibroids: Likely exacerbating heavy bleeding.  Encouraged GYN follow-up.  ED Discharge Orders         Ordered    albuterol (PROVENTIL HFA;VENTOLIN HFA) 108 (90 Base) MCG/ACT inhaler  Every 4 hours PRN     12/17/17 1628    medroxyPROGESTERone (PROVERA) 10 MG tablet  See admin instructions     12/17/17 1628    HYDROcodone-acetaminophen (NORCO/VICODIN) 5-325 MG tablet     12/17/17 1628           Carlisle Cater, PA-C 12/17/17 1633    Tegeler, Gwenyth Allegra, MD 12/17/17 2034

## 2017-12-17 NOTE — Discharge Instructions (Addendum)
Please read and follow all provided instructions.  Your diagnoses today include:  1. Dysfunctional uterine bleeding   2. Uterine leiomyoma, unspecified location   3. Chronic blood loss anemia     Tests performed today include: Blood counts and electrolytes - shows hemoglobin of 8.2 Blood tests to check liver and kidney function Blood tests to check pancreas function Urine test to look for infection and pregnancy (in women) Vital signs. See below for your results today.   Medications prescribed:  Vicodin (hydrocodone/acetaminophen) - narcotic pain medication  DO NOT drive or perform any activities that require you to be awake and alert because this medicine can make you drowsy. BE VERY CAREFUL not to take multiple medicines containing Tylenol (also called acetaminophen). Doing so can lead to an overdose which can damage your liver and cause liver failure and possibly death.  megace - medication to slow vaginal bleeding  Take any prescribed medications only as directed.  Home care instructions:  Follow any educational materials contained in this packet.  Follow-up instructions: Please follow-up with your primary care provider in the next 2 days for further evaluation of your symptoms.    Return instructions:  SEEK IMMEDIATE MEDICAL ATTENTION IF: The pain does not go away or becomes severe  A temperature above 101F develops  Repeated vomiting occurs (multiple episodes)  The pain becomes localized to portions of the abdomen. The right side could possibly be appendicitis. In an adult, the left lower portion of the abdomen could be colitis or diverticulitis.  Blood is being passed in stools or vomit (bright red or black tarry stools)  You develop chest pain, difficulty breathing, dizziness or fainting, or become confused, poorly responsive, or inconsolable (young children) If you have any other emergent concerns regarding your health  Additional Information: Abdominal (belly) pain  can be caused by many things. Your caregiver performed an examination and possibly ordered blood/urine tests and imaging (CT scan, x-rays, ultrasound). Many cases can be observed and treated at home after initial evaluation in the emergency department. Even though you are being discharged home, abdominal pain can be unpredictable. Therefore, you need a repeated exam if your pain does not resolve, returns, or worsens. Most patients with abdominal pain don't have to be admitted to the hospital or have surgery, but serious problems like appendicitis and gallbladder attacks can start out as nonspecific pain. Many abdominal conditions cannot be diagnosed in one visit, so follow-up evaluations are very important.  Your vital signs today were: BP (!) 128/57 (BP Location: Right Arm)    Pulse (!) 54    Temp 98.3 F (36.8 C) (Oral)    Resp 20    LMP 12/10/2017 Comment: bleeding stopped for 2 months, then excessive bleeding for past 7 days   SpO2 100%  If your blood pressure (bp) was elevated above 135/85 this visit, please have this repeated by your doctor within one month. --------------

## 2017-12-17 NOTE — ED Notes (Signed)
Repeat H&H drawn from [facility] x1 attempt via butterfly needle

## 2017-12-17 NOTE — ED Triage Notes (Signed)
Pt reports 10 days ago he started having intense lower abd pain and for 7 days she has been having heavy vaginal bleeding. Pt states she has been wearing depends which she has to change at least 3 times an hour.

## 2017-12-17 NOTE — ED Triage Notes (Signed)
Pt presents with lower abdominal pain and excessive vaginal bleeding for 7 days.

## 2017-12-18 LAB — RPR, QUANT+TP ABS (REFLEX)
Rapid Plasma Reagin, Quant: 1:4 {titer} — ABNORMAL HIGH
T Pallidum Abs: POSITIVE — AB

## 2017-12-18 LAB — HIV ANTIBODY (ROUTINE TESTING W REFLEX): HIV Screen 4th Generation wRfx: NONREACTIVE

## 2017-12-18 LAB — RPR: RPR: REACTIVE — AB

## 2017-12-21 ENCOUNTER — Ambulatory Visit (HOSPITAL_COMMUNITY)
Admission: EM | Admit: 2017-12-21 | Discharge: 2017-12-21 | Disposition: A | Discharge status: Home / Self Care | Payer: Self-pay | Attending: Family Medicine | Admitting: Family Medicine

## 2017-12-21 ENCOUNTER — Encounter (HOSPITAL_COMMUNITY): Payer: Self-pay | Admitting: *Deleted

## 2017-12-21 ENCOUNTER — Other Ambulatory Visit: Payer: Self-pay

## 2017-12-21 DIAGNOSIS — A539 Syphilis, unspecified: Secondary | ICD-10-CM

## 2017-12-21 MED ORDER — PENICILLIN G BENZATHINE 1200000 UNIT/2ML IM SUSP
2.4000 10*6.[IU] | Freq: Once | INTRAMUSCULAR | Status: AC
  Administered 2017-12-21: 2.4 10*6.[IU] via INTRAMUSCULAR

## 2017-12-21 MED ORDER — PENICILLIN G BENZATHINE 1200000 UNIT/2ML IM SUSP
INTRAMUSCULAR | Status: AC
  Filled 2017-12-21: qty 2

## 2017-12-21 NOTE — ED Triage Notes (Signed)
Per pt she was here not that long ago and was instructed to go to the hospital, per pt she was informed she had to come back to urgent care for a penicillin injection due to lab results.

## 2017-12-21 NOTE — ED Notes (Signed)
Pt states she has no concerns and does not need to see a provider.

## 2018-01-30 ENCOUNTER — Encounter (HOSPITAL_COMMUNITY): Payer: Self-pay | Admitting: *Deleted

## 2018-01-30 ENCOUNTER — Emergency Department (HOSPITAL_COMMUNITY): Payer: Self-pay

## 2018-01-30 ENCOUNTER — Emergency Department (HOSPITAL_COMMUNITY)
Admission: EM | Admit: 2018-01-30 | Discharge: 2018-01-30 | Disposition: A | Discharge status: Home / Self Care | Payer: Self-pay | Attending: Emergency Medicine | Admitting: Emergency Medicine

## 2018-01-30 ENCOUNTER — Other Ambulatory Visit: Payer: Self-pay

## 2018-01-30 DIAGNOSIS — B349 Viral infection, unspecified: Secondary | ICD-10-CM | POA: Insufficient documentation

## 2018-01-30 DIAGNOSIS — D508 Other iron deficiency anemias: Secondary | ICD-10-CM

## 2018-01-30 DIAGNOSIS — D649 Anemia, unspecified: Secondary | ICD-10-CM | POA: Insufficient documentation

## 2018-01-30 LAB — CBC WITH DIFFERENTIAL/PLATELET
Abs Immature Granulocytes: 0.03 10*3/uL (ref 0.00–0.07)
BASOS PCT: 0 %
Basophils Absolute: 0 10*3/uL (ref 0.0–0.1)
EOS PCT: 3 %
Eosinophils Absolute: 0.2 10*3/uL (ref 0.0–0.5)
HCT: 27.6 % — ABNORMAL LOW (ref 36.0–46.0)
Hemoglobin: 7.5 g/dL — ABNORMAL LOW (ref 12.0–15.0)
Immature Granulocytes: 0 %
Lymphocytes Relative: 17 %
Lymphs Abs: 1.4 10*3/uL (ref 0.7–4.0)
MCH: 20.3 pg — AB (ref 26.0–34.0)
MCHC: 27.2 g/dL — AB (ref 30.0–36.0)
MCV: 74.8 fL — AB (ref 80.0–100.0)
MONO ABS: 0.7 10*3/uL (ref 0.1–1.0)
MONOS PCT: 9 %
Neutro Abs: 6.1 10*3/uL (ref 1.7–7.7)
Neutrophils Relative %: 71 %
PLATELETS: 277 10*3/uL (ref 150–400)
RBC: 3.69 MIL/uL — AB (ref 3.87–5.11)
RDW: 19 % — ABNORMAL HIGH (ref 11.5–15.5)
WBC: 8.5 10*3/uL (ref 4.0–10.5)
nRBC: 0 % (ref 0.0–0.2)

## 2018-01-30 LAB — URINALYSIS, ROUTINE W REFLEX MICROSCOPIC
Bilirubin Urine: NEGATIVE
Glucose, UA: NEGATIVE mg/dL
Hgb urine dipstick: NEGATIVE
Ketones, ur: NEGATIVE mg/dL
LEUKOCYTES UA: NEGATIVE
NITRITE: NEGATIVE
PROTEIN: NEGATIVE mg/dL
Specific Gravity, Urine: 1.018 (ref 1.005–1.030)
pH: 5 (ref 5.0–8.0)

## 2018-01-30 LAB — RETICULOCYTES
Immature Retic Fract: 9.9 % (ref 2.3–15.9)
RBC.: 3.86 MIL/uL — AB (ref 3.87–5.11)
RETIC CT PCT: 2.2 % (ref 0.4–3.1)
Retic Count, Absolute: 83 10*3/uL (ref 19.0–186.0)

## 2018-01-30 LAB — FERRITIN: FERRITIN: 3 ng/mL — AB (ref 11–307)

## 2018-01-30 LAB — COMPREHENSIVE METABOLIC PANEL
ALT: 14 U/L (ref 0–44)
ANION GAP: 7 (ref 5–15)
AST: 18 U/L (ref 15–41)
Albumin: 3.8 g/dL (ref 3.5–5.0)
Alkaline Phosphatase: 63 U/L (ref 38–126)
BUN: 5 mg/dL — ABNORMAL LOW (ref 6–20)
CHLORIDE: 107 mmol/L (ref 98–111)
CO2: 26 mmol/L (ref 22–32)
Calcium: 9 mg/dL (ref 8.9–10.3)
Creatinine, Ser: 0.9 mg/dL (ref 0.44–1.00)
GFR calc non Af Amer: >60 mL/min (ref >60)
Glucose, Bld: 98 mg/dL (ref 70–99)
POTASSIUM: 4.2 mmol/L (ref 3.5–5.1)
SODIUM: 140 mmol/L (ref 135–145)
Total Bilirubin: 0.7 mg/dL (ref 0.3–1.2)
Total Protein: 6.1 g/dL — ABNORMAL LOW (ref 6.5–8.1)

## 2018-01-30 LAB — IRON AND TIBC
IRON: 10 ug/dL — AB (ref 28–170)
Saturation Ratios: 2 % — ABNORMAL LOW (ref 10.4–31.8)
TIBC: 494 ug/dL — AB (ref 250–450)
UIBC: 484 ug/dL

## 2018-01-30 LAB — I-STAT BETA HCG BLOOD, ED (MC, WL, AP ONLY): I-stat hCG, quantitative: <5 m[IU]/mL (ref <5)

## 2018-01-30 LAB — VITAMIN B12: VITAMIN B 12: 259 pg/mL (ref 180–914)

## 2018-01-30 LAB — FOLATE: Folate: 6.7 ng/mL (ref >5.9)

## 2018-01-30 MED ORDER — ACETAMINOPHEN 325 MG PO TABS
650.0000 mg | ORAL_TABLET | Freq: Once | ORAL | Status: AC
  Administered 2018-01-30: 650 mg via ORAL
  Filled 2018-01-30: qty 2

## 2018-01-30 MED ORDER — ONDANSETRON HCL 4 MG/2ML IJ SOLN: 4.0000 mg | Freq: Once | INTRAMUSCULAR | Status: DC

## 2018-01-30 MED ORDER — MEGESTROL ACETATE 40 MG PO TABS
120.0000 mg | ORAL_TABLET | Freq: Every day | ORAL | Status: DC
  Filled 2018-01-30: qty 3

## 2018-01-30 MED ORDER — SODIUM CHLORIDE 0.9 % IV BOLUS: 1000.0000 mL | Freq: Once | INTRAVENOUS | Status: DC

## 2018-01-30 MED ORDER — KETOROLAC TROMETHAMINE 30 MG/ML IJ SOLN: 30.0000 mg | Freq: Once | INTRAMUSCULAR | Status: DC

## 2018-01-30 MED ORDER — FERROUS SULFATE 325 (65 FE) MG PO TABS: 325.0000 mg | ORAL_TABLET | Freq: Two times a day (BID) | ORAL | 0 refills | Status: DC

## 2018-01-30 NOTE — Discharge Instructions (Signed)
Take iron pills.   Take tylenol for chills, myalgias   Stay hydrated.   Avoid NSAIDS. Repeat CBC in a week with your doctor   Return to ER if you have worse vomiting, fever, trouble breathing, trouble swallowing.

## 2018-01-30 NOTE — ED Notes (Signed)
Went into room to discharge patient and patient had left prior to being discharged.

## 2018-01-30 NOTE — ED Triage Notes (Signed)
The pt is c/o generalized body aches and a  Toothache for 3 days unknown temp  lmp 3 days ago

## 2018-01-30 NOTE — ED Provider Notes (Signed)
[facility] EMERGENCY DEPARTMENT Provider Note   CSN: [id number] Arrival date & time: 01/30/18  0533     History   Chief Complaint Chief Complaint  Patient presents with  . Generalized Body Aches    HPI Kimberly Jefferson is a 52 y.o. female history of hypertension, who presenting with sore throat, body aches, cough, chills.  For the last 3 days, patient has been having generalized body aches.  Also has some sore throat as well as nonproductive cough.  She had a some nausea and several episodes of vomiting.  Denies any diarrhea or abdominal pain.  Denies any sick contact.   The history is provided by the patient.    Past Medical History:  Diagnosis Date  . Anxiety    no meds  . Dysmenorrhea   . Heart murmur    "nothing to worry about" dx child, no problems ever  . History of blood transfusion  2013   heavy periods, MC 2 units transfuesd  . HTN (hypertension) 03/12/2012   one time no med  . Hypertension    one time  . Pneumonia     hx "had it once a year for a few years"  last time was 3- 4 years ago.  . SVD (spontaneous vaginal delivery)    x 2    Patient Active Problem List   Diagnosis Date Noted  . Vitamin B12 deficiency anemia 03/22/2016  . Heavy menstrual bleeding 03/22/2016  . URI (upper respiratory infection) 03/22/2016  . Folate deficiency 03/22/2016  . Uterine fibroid 03/22/2016  . Increased endometrial stripe 03/22/2016  . Ankle fracture, right 06/18/2012  . Hypokalemia 03/13/2012  . Chest pain 03/12/2012  . HTN (hypertension) 03/12/2012  . Trichomonas 03/12/2012  . Left sided numbness 03/11/2012  . Syncope 03/11/2012  . Anemia 03/11/2012    Past Surgical History:  Procedure Laterality Date  . [place]   x 1  . CHOLECYSTECTOMY  1990  . ORIF ANKLE FRACTURE Right 06/18/2012   Procedure: OPEN REDUCTION INTERNAL FIXATION (ORIF) ANKLE FRACTURE;  Surgeon: Augustin Schooling, MD;  Location: [facility];  Service: Orthopedics;   Laterality: Right;  . TUBAL LIGATION  1990     OB History   None      Home Medications    Prior to Admission medications   Medication Sig Start Date End Date Taking? Authorizing Provider  Multiple Vitamins-Minerals (EMERGEN-C IMMUNE) PACK Take 1 Package by mouth as needed.   Yes [provider]  albuterol (PROVENTIL HFA;VENTOLIN HFA) 108 (90 Base) MCG/ACT inhaler Inhale 2 puffs into the lungs every 4 (four) hours as needed for wheezing or shortness of breath. Patient not taking: Reported on 01/30/2018 12/17/17   Carlisle Cater, PA-C  ferrous sulfate (FERROUSUL) 325 (65 FE) MG tablet Take 1 tablet (325 mg total) by mouth 2 (two) times daily with a meal. Patient not taking: Reported on 01/30/2018 10/24/17   Rodell Perna A, PA-C  HYDROcodone-acetaminophen (NORCO/VICODIN) 5-325 MG tablet Take 1 tablets every 6 hours as needed for severe pain Patient not taking: Reported on 01/30/2018 12/17/17   Carlisle Cater, PA-C  megestrol (MEGACE) 40 MG tablet Take 2 tablets (80 mg total) by mouth 3 (three) times daily as needed (bleeding). Patient not taking: Reported on 01/30/2018 12/17/17   Caccavale, Sophia, PA-C  predniSONE (STERAPRED UNI-PAK 21 TAB) 10 MG (21) TBPK tablet Take 6 tablets tomorrow, decrease by 1 each day till finished (1,0,2,5,8,5) Patient not taking: Reported on 12/17/2017 11/02/16  Barnet Glasgow, NP    Family History Family History  Problem Relation Age of Onset  . Breast cancer Maternal Aunt     Social History Social History   Tobacco Use  . Smoking status: Current Every Day Smoker    Packs/day: 1.00    Years: 20.00    Pack years: 20.00    Types: Cigarettes  . Smokeless tobacco: Never Used  Substance Use Topics  . Alcohol use: No  . Drug use: No     Allergies   Bee venom and Doxycycline   Review of Systems Review of Systems  Constitutional: Positive for chills.  HENT: Positive for sore throat.   Respiratory: Positive for cough.   All other  systems reviewed and are negative.    Physical Exam Updated Vital Signs BP 130/68 (BP Location: Right Arm)   Pulse 63   Temp 98.4 F (36.9 C) (Oral)   Resp 14   Ht 5\' 6"  (1.676 m)   Wt 72.6 kg   LMP 01/27/2018   SpO2 98%   BMI 25.82 kg/m   Physical Exam  Constitutional: She is oriented to person, place, and time. She appears well-developed and well-nourished.  HENT:  Head: Normocephalic.  OP clear, tonsils not enlarged or red and no exudates   Eyes: Pupils are equal, round, and reactive to light. Conjunctivae and EOM are normal.  Neck: Normal range of motion. Neck supple. No JVD present. No tracheal deviation present. No thyromegaly present.  Cardiovascular: Normal rate and regular rhythm.  Pulmonary/Chest: Effort normal and breath sounds normal. No stridor. No respiratory distress.  Abdominal: Soft. Bowel sounds are normal. She exhibits no distension. There is no tenderness.  Musculoskeletal: Normal range of motion. She exhibits no edema.  Lymphadenopathy:    She has no cervical adenopathy.  Neurological: She is alert and oriented to person, place, and time. No cranial nerve deficit. Coordination normal.  Skin: Skin is warm. Capillary refill takes less than 2 seconds.  Nursing note and vitals reviewed.    ED Treatments / Results  Labs (all labs ordered are listed, but only abnormal results are displayed) Labs Reviewed  CBC WITH DIFFERENTIAL/PLATELET - Abnormal; Notable for the following components:      Result Value   RBC 3.69 (*)    Hemoglobin 7.5 (*)    HCT 27.6 (*)    MCV 74.8 (*)    MCH 20.3 (*)    MCHC 27.2 (*)    RDW 19.0 (*)    All other components within normal limits  COMPREHENSIVE METABOLIC PANEL - Abnormal; Notable for the following components:   BUN 5 (*)    Total Protein 6.1 (*)    All other components within normal limits  IRON AND TIBC - Abnormal; Notable for the following components:   Iron 10 (*)    TIBC 494 (*)    Saturation Ratios 2 (*)      All other components within normal limits  FERRITIN - Abnormal; Notable for the following components:   Ferritin 3 (*)    All other components within normal limits  RETICULOCYTES - Abnormal; Notable for the following components:   RBC. 3.86 (*)    All other components within normal limits  URINALYSIS, ROUTINE W REFLEX MICROSCOPIC  VITAMIN B12  FOLATE  I-STAT BETA HCG BLOOD, ED (MC, [facility], AP ONLY)    EKG None  Radiology Dg Chest 2 View  Result Date: 01/30/2018 CLINICAL DATA:  Cough and congestion EXAM: CHEST - 2 VIEW COMPARISON:  October 24, 2017 FINDINGS: No edema or consolidation. The heart size and pulmonary vascularity are normal. No adenopathy. No pneumothorax. No bone lesions. IMPRESSION: No edema or consolidation. Electronically Signed   By: Lowella Grip III M.D.   On: 01/30/2018 08:37    Procedures Procedures (including critical care time)  Medications Ordered in ED Medications  sodium chloride 0.9 % bolus 1,000 mL (1,000 mLs Intravenous Not Given 01/30/18 1055)  ondansetron (ZOFRAN) injection 4 mg (4 mg Intravenous Not Given 01/30/18 1055)  acetaminophen (TYLENOL) tablet 650 mg (650 mg Oral Given 01/30/18 1014)     Initial Impression / Assessment and Plan / ED Course  I have reviewed the triage vital signs and the nursing notes.  Pertinent labs & imaging results that were available during my care of the patient were reviewed by me and considered in my medical decision making (see chart for details).    Kimberly Jefferson is a 52 y.o. female here with chills, cough, nausea. I think likely viral syndrome vs pneumonia vs UTI. OP is clear, I doubt strep. Will get labs, CXR, UA. Will hydrate and reassess.   12:50 PM Labs showed Hg 7.5, was similar to previous. Had vaginal bleeding several days ago that stopped. Anemia panel showed iron deficiency anemia. UA and CXR clear. I think viral syndrome. Patient was given megace last time but states that it didn't help. Told her  to take tylenol for fever, chills, avoid NSAIDs since she is anemic at baseline. Told her to stay hydrated and take iron pills.    Final Clinical Impressions(s) / ED Diagnoses   Final diagnoses:  None    ED Discharge Orders    None       Drenda Freeze, MD 01/30/18 1254

## 2018-01-30 NOTE — ED Notes (Signed)
Patient transported to X-ray 

## 2018-01-30 NOTE — ED Notes (Signed)
Pt informed out the need for a UA sample

## 2018-01-30 NOTE — ED Notes (Signed)
Patient was unsure about IV at this time.

## 2018-04-29 ENCOUNTER — Other Ambulatory Visit: Payer: Self-pay

## 2018-04-29 ENCOUNTER — Encounter (HOSPITAL_COMMUNITY): Payer: Self-pay | Admitting: *Deleted

## 2018-04-29 ENCOUNTER — Emergency Department (HOSPITAL_COMMUNITY)
Admission: EM | Admit: 2018-04-29 | Discharge: 2018-04-29 | Disposition: A | Discharge status: Home / Self Care | Payer: Self-pay | Attending: Emergency Medicine | Admitting: Emergency Medicine

## 2018-04-29 DIAGNOSIS — K0889 Other specified disorders of teeth and supporting structures: Secondary | ICD-10-CM | POA: Insufficient documentation

## 2018-04-29 DIAGNOSIS — F17210 Nicotine dependence, cigarettes, uncomplicated: Secondary | ICD-10-CM | POA: Insufficient documentation

## 2018-04-29 DIAGNOSIS — I10 Essential (primary) hypertension: Secondary | ICD-10-CM | POA: Insufficient documentation

## 2018-04-29 DIAGNOSIS — Z79899 Other long term (current) drug therapy: Secondary | ICD-10-CM | POA: Insufficient documentation

## 2018-04-29 MED ORDER — PENICILLIN V POTASSIUM 500 MG PO TABS: 500.0000 mg | ORAL_TABLET | Freq: Four times a day (QID) | ORAL | 0 refills | Status: AC

## 2018-04-29 MED ORDER — OXYCODONE-ACETAMINOPHEN 5-325 MG PO TABS
1.0000 | ORAL_TABLET | Freq: Once | ORAL | Status: AC
  Administered 2018-04-29: 1 via ORAL
  Filled 2018-04-29: qty 1

## 2018-04-29 MED ORDER — OXYCODONE-ACETAMINOPHEN 5-325 MG PO TABS: 1.0000 | ORAL_TABLET | Freq: Four times a day (QID) | ORAL | 0 refills | Status: DC | PRN

## 2018-04-29 MED ORDER — IBUPROFEN 400 MG PO TABS
400.0000 mg | ORAL_TABLET | Freq: Once | ORAL | Status: AC
  Administered 2018-04-29: 400 mg via ORAL
  Filled 2018-04-29: qty 1

## 2018-04-29 NOTE — Discharge Instructions (Addendum)
Please read attached information. If you experience any new or worsening signs or symptoms please return to the emergency room for evaluation. Please follow-up with your primary care provider or specialist as discussed. Please use medication prescribed only as directed and discontinue taking if you have any concerning signs or symptoms.   °

## 2018-04-29 NOTE — ED Triage Notes (Signed)
C/o toothe ache onset 3 weeks ago states she hasn't been able to get into see a dentist.

## 2018-04-29 NOTE — ED Provider Notes (Signed)
[facility] EMERGENCY DEPARTMENT Provider Note   CSN: [id number] Arrival date & time: 04/29/18  0609   History   Chief Complaint Chief Complaint  Patient presents with  . Dental Pain    HPI Kimberly Jefferson is a 53 y.o. female.  HPI   53 year old female presents today with complaints of dental pain.  Patient notes severe left upper and lower dental pain.  She notes that she is extremely sensitive to cold, denies any swelling, notes that this has been ongoing for 3 weeks but has worsened over the last 2 days.  She notes she has an appointment next week with a dentist but does not feel like she can make it that long.  She notes that she has been taking ibuprofen without significant improvement in her symptoms.  Patient denies any trauma, denies any fever.   Past Medical History:  Diagnosis Date  . Anxiety    no meds  . Dysmenorrhea   . Heart murmur    "nothing to worry about" dx child, no problems ever  . History of blood transfusion  2013   heavy periods, MC 2 units transfuesd  . HTN (hypertension) 03/12/2012   one time no med  . Hypertension    one time  . Pneumonia     hx "had it once a year for a few years"  last time was 3- 4 years ago.  . SVD (spontaneous vaginal delivery)    x 2    Patient Active Problem List   Diagnosis Date Noted  . Vitamin B12 deficiency anemia 03/22/2016  . Heavy menstrual bleeding 03/22/2016  . URI (upper respiratory infection) 03/22/2016  . Folate deficiency 03/22/2016  . Uterine fibroid 03/22/2016  . Increased endometrial stripe 03/22/2016  . Ankle fracture, right 06/18/2012  . Hypokalemia 03/13/2012  . Chest pain 03/12/2012  . HTN (hypertension) 03/12/2012  . Trichomonas 03/12/2012  . Left sided numbness 03/11/2012  . Syncope 03/11/2012  . Anemia 03/11/2012    Past Surgical History:  Procedure Laterality Date  . [place]   x 1  . CHOLECYSTECTOMY  1990  . ORIF ANKLE FRACTURE Right 06/18/2012   Procedure: OPEN REDUCTION INTERNAL FIXATION (ORIF) ANKLE FRACTURE;  Surgeon: Augustin Schooling, MD;  Location: [facility];  Service: Orthopedics;  Laterality: Right;  . TUBAL LIGATION  1990     OB History   No obstetric history on file.      Home Medications    Prior to Admission medications   Medication Sig Start Date End Date Taking? Authorizing Provider  albuterol (PROVENTIL HFA;VENTOLIN HFA) 108 (90 Base) MCG/ACT inhaler Inhale 2 puffs into the lungs every 4 (four) hours as needed for wheezing or shortness of breath. Patient not taking: Reported on 01/30/2018 12/17/17   Carlisle Cater, PA-C  ferrous sulfate (FERROUSUL) 325 (65 FE) MG tablet Take 1 tablet (325 mg total) by mouth 2 (two) times daily with a meal. 01/30/18   Drenda Freeze, MD  HYDROcodone-acetaminophen (NORCO/VICODIN) 5-325 MG tablet Take 1 tablets every 6 hours as needed for severe pain Patient not taking: Reported on 01/30/2018 12/17/17   Carlisle Cater, PA-C  megestrol (MEGACE) 40 MG tablet Take 2 tablets (80 mg total) by mouth 3 (three) times daily as needed (bleeding). Patient not taking: Reported on 01/30/2018 12/17/17   Caccavale, Sophia, PA-C  Multiple Vitamins-Minerals (EMERGEN-C IMMUNE) PACK Take 1 Package by mouth as needed.    [provider]  oxyCODONE-acetaminophen (PERCOCET/ROXICET) 5-325 MG tablet  Take 1 tablet by mouth every 6 (six) hours as needed. 04/29/18   Jacqualyn Sedgwick, Dellis Filbert, PA-C  penicillin v potassium (VEETID) 500 MG tablet Take 1 tablet (500 mg total) by mouth 4 (four) times daily for 7 days. 04/29/18 05/06/18  Ryna Beckstrom, Dellis Filbert, PA-C  predniSONE (STERAPRED UNI-PAK 21 TAB) 10 MG (21) TBPK tablet Take 6 tablets tomorrow, decrease by 1 each day till finished (7,3,5,3,2,9) Patient not taking: Reported on 12/17/2017 11/02/16   Barnet Glasgow, NP    Family History Family History  Problem Relation Age of Onset  . Breast cancer Maternal Aunt     Social History Social History   Tobacco Use  . Smoking  status: Current Every Day Smoker    Packs/day: 1.00    Years: 20.00    Pack years: 20.00    Types: Cigarettes  . Smokeless tobacco: Never Used  Substance Use Topics  . Alcohol use: No  . Drug use: No     Allergies   Bee venom and Doxycycline   Review of Systems Review of Systems  All other systems reviewed and are negative.    Physical Exam Updated Vital Signs BP (!) 194/129 (BP Location: Right Arm)   Pulse (!) 102   Temp 98.5 F (36.9 C) (Oral)   Resp (!) 22   Ht 5\' 7"  (1.702 m)   Wt 65.8 kg   SpO2 100%   BMI 22.71 kg/m   Physical Exam Vitals signs and nursing note reviewed.  Constitutional:      Appearance: She is well-developed.  HENT:     Head: Normocephalic and atraumatic.     Comments: Numerous missing teeth throughout, sensitivity to palpation of the upper and lower left lateral gum lines, no swelling, floor the mouth soft neck supple full active range of motion no lymphadenopathy Eyes:     General: No scleral icterus.       Right eye: No discharge.        Left eye: No discharge.     Conjunctiva/sclera: Conjunctivae normal.     Pupils: Pupils are equal, round, and reactive to light.  Neck:     Musculoskeletal: Normal range of motion.     Vascular: No JVD.     Trachea: No tracheal deviation.  Pulmonary:     Effort: Pulmonary effort is normal.     Breath sounds: No stridor.  Neurological:     Mental Status: She is alert and oriented to person, place, and time.     Coordination: Coordination normal.  Psychiatric:        Behavior: Behavior normal.        Thought Content: Thought content normal.        Judgment: Judgment normal.      ED Treatments / Results  Labs (all labs ordered are listed, but only abnormal results are displayed) Labs Reviewed - No data to display  EKG None  Radiology No results found.  Procedures Procedures (including critical care time)  Medications Ordered in ED Medications  oxyCODONE-acetaminophen  (PERCOCET/ROXICET) 5-325 MG per tablet 1 tablet (has no administration in time range)  ibuprofen (ADVIL,MOTRIN) tablet 400 mg (400 mg Oral Given 04/29/18 9242)     Initial Impression / Assessment and Plan / ED Course  I have reviewed the triage vital signs and the nursing notes.  Pertinent labs & imaging results that were available during my care of the patient were reviewed by me and considered in my medical decision making (see chart for details).  53 year old female presents today with complaints of dental pain.  She is tearful and appears to be acutely uncomfortable.  I do find it reasonable to start her on antibiotics, pain medication with close outpatient follow-up.  Return precautions given.  She verbalized understanding and agreement to today's plan.  Final Clinical Impressions(s) / ED Diagnoses   Final diagnoses:  Pain, dental    ED Discharge Orders         Ordered    oxyCODONE-acetaminophen (PERCOCET/ROXICET) 5-325 MG tablet  Every 6 hours PRN     04/29/18 0753    penicillin v potassium (VEETID) 500 MG tablet  4 times daily     04/29/18 0753           Okey Regal, PA-C 04/29/18 3200    Sherwood Gambler, MD 05/03/18 [phone]

## 2018-09-18 ENCOUNTER — Encounter (HOSPITAL_COMMUNITY): Payer: Self-pay | Admitting: Emergency Medicine

## 2018-09-18 ENCOUNTER — Ambulatory Visit (HOSPITAL_COMMUNITY)
Admission: EM | Admit: 2018-09-18 | Discharge: 2018-09-18 | Disposition: A | Discharge status: Home / Self Care | Payer: Self-pay | Attending: Family Medicine | Admitting: Family Medicine

## 2018-09-18 ENCOUNTER — Other Ambulatory Visit: Payer: Self-pay

## 2018-09-18 DIAGNOSIS — M545 Low back pain, unspecified: Secondary | ICD-10-CM

## 2018-09-18 LAB — POCT PREGNANCY, URINE: Preg Test, Ur: NEGATIVE

## 2018-09-18 MED ORDER — HYDROCODONE-ACETAMINOPHEN 5-325 MG PO TABS: 1.0000 | ORAL_TABLET | Freq: Four times a day (QID) | ORAL | 0 refills | Status: DC | PRN

## 2018-09-18 MED ORDER — PREDNISONE 10 MG (21) PO TBPK: ORAL_TABLET | Freq: Every day | ORAL | 0 refills | Status: DC

## 2018-09-18 NOTE — ED Triage Notes (Signed)
Right lower back pain, pain into buttocks.  Frequent urination, denies pain with urination  Symptoms started 4 days ago

## 2018-09-18 NOTE — Discharge Instructions (Signed)

## 2018-09-18 NOTE — ED Provider Notes (Signed)
Kimberly Jefferson   [id number] 09/18/18 Arrival Time: 6759  ASSESSMENT & PLAN:  1. Acute right-sided low back pain without sciatica    Able to ambulate here and hemodynamically stable. No indication for imaging of back at this time given no trauma and normal neurological exam. Discussed.  Meds ordered this encounter  Medications   predniSONE (STERAPRED UNI-PAK 21 TAB) 10 MG (21) TBPK tablet    Sig: Take by mouth daily. Take as directed.    Dispense:  21 tablet    Refill:  0   HYDROcodone-acetaminophen (NORCO/VICODIN) 5-325 MG tablet    Sig: Take 1 tablet by mouth every 6 (six) hours as needed for moderate pain or severe pain.    Dispense:  8 tablet    Refill:  0   [place] Controlled Substances Registry consulted for this patient. I feel the risk/benefit ratio today is favorable for proceeding with this prescription for a controlled substance. Medication sedation precautions given.  Encourage ROM/movement as tolerated.  Follow-up Information    [facility].   Specialty:  Urgent Care Why:  As needed. Contact information: [address] [phone]         Reviewed expectations re: course of current medical issues. Questions answered. Outlined signs and symptoms indicating need for more acute intervention. Patient verbalized understanding. After Visit Summary given.   SUBJECTIVE: History from: patient.  Kimberly Jefferson is a 53 y.o. female who presents with complaint of fairly persistent right sided lower back pain. Onset abrupt, approx 4 days ago. Injury/trama: no. History of back problems: none reported. Discomfort described as aching and stiffness without radiation. Pain worsened with certain movements and improved with rest. Progressive LE weakness or saddle anesthesia: none. Extremity sensation changes or weakness: none. Ambulatory without difficulty. Normal bowel/bladder habits: yes, without  urinary retention. No associated abdominal pain/n/v. Self treatment: has had no relief from OTC pain medications (ibuprofen and tylenol).  Reports no chronic steroid use, fevers, IV drug use, or recent back surgeries or procedures.  ROS: As per HPI. All other systems negative.   OBJECTIVE:  Vitals:   09/18/18 1050  BP: (!) 141/86  Pulse: 62  Resp: 18  Temp: 98 F (36.7 C)  TempSrc: Oral  SpO2: 100%    General appearance: alert; no distress Neck: supple with FROM; without midline tenderness CV: RRR Lungs: unlabored respirations; symmetrical air entry Abdomen: soft, non-tender; non-distended Back: moderate right sided tenderness of her lower paraspinal musculature and over SI joint; FROM at waist; bruising: none; without midline tenderness Extremities: no edema; symmetrical with no gross deformities; normal ROM of bilateral lower extremities Skin: warm and dry Neurologic: normal gait; normal reflexes of RLE and LLE; normal sensation of RLE and LLE; normal strength of RLE and LLE Psychological: alert and cooperative; normal mood and affect  Allergies  Allergen Reactions   Bee Venom Anaphylaxis   Doxycycline Nausea Only    Past Medical History:  Diagnosis Date   Anxiety    no meds   Dysmenorrhea    Heart murmur    "nothing to worry about" dx child, no problems ever   History of blood transfusion  2013   heavy periods, MC 2 units transfuesd   HTN (hypertension) 03/12/2012   one time no med   Hypertension    one time   Pneumonia     hx "had it once a year for a few years"  last time was 3- 4  years ago.   SVD (spontaneous vaginal delivery)    x 2   Social History   Socioeconomic History   Marital status: Divorced    Spouse name: Not on file   Number of children: Not on file   Years of education: Not on file   Highest education level: Not on file  Occupational History   Not on file  Social Needs   Financial resource strain: Not on file   Food  insecurity:    Worry: Not on file    Inability: Not on file   Transportation needs:    Medical: Not on file    Non-medical: Not on file  Tobacco Use   Smoking status: Current Every Day Smoker    Packs/day: 1.00    Years: 20.00    Pack years: 20.00    Types: Cigarettes   Smokeless tobacco: Never Used  Substance and Sexual Activity   Alcohol use: No   Drug use: No   Sexual activity: Yes    Birth control/protection: Surgical  Lifestyle   Physical activity:    Days per week: Not on file    Minutes per session: Not on file   Stress: Not on file  Relationships   Social connections:    Talks on phone: Not on file    Gets together: Not on file    Attends religious service: Not on file    Active member of club or organization: Not on file    Attends meetings of clubs or organizations: Not on file    Relationship status: Not on file   Intimate partner violence:    Fear of current or ex partner: Not on file    Emotionally abused: Not on file    Physically abused: Not on file    Forced sexual activity: Not on file  Other Topics Concern   Not on file  Social History Narrative   Not on file   Family History  Problem Relation Age of Onset   Breast cancer Maternal Aunt    Past Surgical History:  Procedure Laterality Date   CESAREAN SECTION  1988   x 1   CHOLECYSTECTOMY  1990   ORIF ANKLE FRACTURE Right 06/18/2012   Procedure: OPEN REDUCTION INTERNAL FIXATION (ORIF) ANKLE FRACTURE;  Surgeon: Augustin Schooling, MD;  Location: [place];  Service: Orthopedics;  Laterality: Right;   TUBAL LIGATION  1990     Vanessa Kick, MD 09/18/18 1118

## 2018-10-17 ENCOUNTER — Encounter (HOSPITAL_COMMUNITY): Payer: Self-pay

## 2018-10-17 ENCOUNTER — Other Ambulatory Visit: Payer: Self-pay

## 2018-10-17 ENCOUNTER — Ambulatory Visit (HOSPITAL_COMMUNITY)
Admission: EM | Admit: 2018-10-17 | Discharge: 2018-10-17 | Disposition: A | Discharge status: Home / Self Care | Payer: Self-pay | Attending: Internal Medicine | Admitting: Internal Medicine

## 2018-10-17 DIAGNOSIS — I10 Essential (primary) hypertension: Secondary | ICD-10-CM

## 2018-10-17 DIAGNOSIS — M722 Plantar fascial fibromatosis: Secondary | ICD-10-CM

## 2018-10-17 MED ORDER — NAPROXEN 375 MG PO TABS: 375.0000 mg | ORAL_TABLET | Freq: Two times a day (BID) | ORAL | 0 refills | Status: DC

## 2018-10-17 MED ORDER — KETOROLAC TROMETHAMINE 30 MG/ML IJ SOLN
30.0000 mg | Freq: Once | INTRAMUSCULAR | Status: AC
  Administered 2018-10-17: 14:00:00 30 mg via INTRAMUSCULAR

## 2018-10-17 MED ORDER — KETOROLAC TROMETHAMINE 30 MG/ML IJ SOLN
INTRAMUSCULAR | Status: AC
  Filled 2018-10-17: qty 1

## 2018-10-17 NOTE — ED Triage Notes (Signed)
Patient presents to Urgent Care with complaints of right foot pain in the center from her arch to her heel since about a week ago. Patient reports the pain is worst when she first stands up.

## 2018-10-17 NOTE — ED Provider Notes (Signed)
[place]    CSN: [id number] Arrival date & time: 10/17/18  1228     History   Chief Complaint Chief Complaint  Patient presents with  . Foot Pain    HPI Kimberly Jefferson is a 53 y.o. female comes to the urgent care with complaints of right foot pain of 1 week duration.  Onset of pain was sudden.  No trauma to the foot.  Pain is worse in the morning and seems to ease off towards the end of the day.  No relieving factors.  No swelling of the foot.  No skin changes.  No fever or chills.  Patient walks a lot during her normal day's activities.  She is in the landscaping business  Past Medical History:  Diagnosis Date  . Anxiety    no meds  . Dysmenorrhea   . Heart murmur    "nothing to worry about" dx child, no problems ever  . History of blood transfusion  2013   heavy periods, MC 2 units transfuesd  . HTN (hypertension) 03/12/2012   one time no med  . Hypertension    one time  . Pneumonia     hx "had it once a year for a few years"  last time was 3- 4 years ago.  . SVD (spontaneous vaginal delivery)    x 2    Patient Active Problem List   Diagnosis Date Noted  . Vitamin B12 deficiency anemia 03/22/2016  . Heavy menstrual bleeding 03/22/2016  . URI (upper respiratory infection) 03/22/2016  . Folate deficiency 03/22/2016  . Uterine fibroid 03/22/2016  . Increased endometrial stripe 03/22/2016  . Ankle fracture, right 06/18/2012  . Hypokalemia 03/13/2012  . Chest pain 03/12/2012  . HTN (hypertension) 03/12/2012  . Trichomonas 03/12/2012  . Left sided numbness 03/11/2012  . Syncope 03/11/2012  . Anemia 03/11/2012    Past Surgical History:  Procedure Laterality Date  . [place]   x 1  . CHOLECYSTECTOMY  1990  . ORIF ANKLE FRACTURE Right 06/18/2012   Procedure: OPEN REDUCTION INTERNAL FIXATION (ORIF) ANKLE FRACTURE;  Surgeon: Augustin Schooling, MD;  Location: [facility];  Service: Orthopedics;  Laterality: Right;  . TUBAL LIGATION  1990     OB History   No obstetric history on file.      Home Medications    Prior to Admission medications   Medication Sig Start Date End Date Taking? Authorizing Provider  naproxen (NAPROSYN) 375 MG tablet Take 1 tablet (375 mg total) by mouth 2 (two) times daily. 10/17/18   Lamptey, Myrene Galas, MD  albuterol (PROVENTIL HFA;VENTOLIN HFA) 108 (90 Base) MCG/ACT inhaler Inhale 2 puffs into the lungs every 4 (four) hours as needed for wheezing or shortness of breath. Patient not taking: Reported on 01/30/2018 12/17/17 10/17/18  Carlisle Cater, PA-C    Family History Family History  Problem Relation Age of Onset  . Healthy Mother   . Healthy Father   . Breast cancer Maternal Aunt     Social History Social History   Tobacco Use  . Smoking status: Current Every Day Smoker    Packs/day: 0.50    Years: 20.00    Pack years: 10.00    Types: Cigarettes  . Smokeless tobacco: Never Used  Substance Use Topics  . Alcohol use: No  . Drug use: No     Allergies   Bee venom and Doxycycline   Review of Systems Review of Systems  Constitutional: Positive for activity  change. Negative for fatigue and fever.  Genitourinary: Negative for dysuria.  Musculoskeletal: Negative for arthralgias, back pain, gait problem, joint swelling and myalgias.  Neurological: Negative for dizziness, weakness, light-headedness, numbness and headaches.     Physical Exam Triage Vital Signs ED Triage Vitals  Enc Vitals Group     BP 10/17/18 1247 (!) 151/92     Pulse Rate 10/17/18 1247 69     Resp 10/17/18 1247 17     Temp 10/17/18 1247 (!) 97.5 F (36.4 C)     Temp Source 10/17/18 1247 Oral     SpO2 10/17/18 1247 99 %     Weight --      Height --      Head Circumference --      Peak Flow --      Pain Score 10/17/18 1245 5     Pain Loc --      Pain Edu? --      Excl. in [place]? --    No data found.  Updated Vital Signs BP (!) 151/92 (BP Location: Left Arm)   Pulse 69   Temp (!) 97.5 F (36.4 C) (Oral)    Resp 17   SpO2 99%   Visual Acuity Right Eye Distance:   Left Eye Distance:   Bilateral Distance:    Right Eye Near:   Left Eye Near:    Bilateral Near:     Physical Exam Cardiovascular:     Rate and Rhythm: Normal rate and regular rhythm.     Pulses: Normal pulses.     Heart sounds: Normal heart sounds.  Abdominal:     General: Bowel sounds are normal.     Palpations: Abdomen is soft.  Musculoskeletal: Normal range of motion.        General: Tenderness present. No swelling or deformity.     Comments: Tenderness on the plantar surface of the right mid-foot.  No loss of sensation.  Normal range of motion without pain.  Skin:    General: Skin is warm.     Capillary Refill: Capillary refill takes less than 2 seconds.     Coloration: Skin is not jaundiced.     Findings: No bruising or erythema.  Neurological:     General: No focal deficit present.      UC Treatments / Results  Labs (all labs ordered are listed, but only abnormal results are displayed) Labs Reviewed - No data to display  EKG None  Radiology No results found.  Procedures Procedures (including critical care time)  Medications Ordered in UC Medications  ketorolac (TORADOL) 30 MG/ML injection 30 mg (30 mg Intramuscular Given 10/17/18 1334)  ketorolac (TORADOL) 30 MG/ML injection (has no administration in time range)    Initial Impression / Assessment and Plan / UC Course  I have reviewed the triage vital signs and the nursing notes.  Pertinent labs & imaging results that were available during my care of the patient were reviewed by me and considered in my medical decision making (see chart for details).    1.  Plantar fasciitis of the right foot: Patient is advised to use naproxen as needed for right foot pain She is further advised to get comfortable shoe insert.  If patient's symptoms does not improve she may need steroid infiltration into the mid-foot area. Final Clinical Impressions(s) / UC  Diagnoses   Final diagnoses:  Plantar fasciitis   Discharge Instructions   None    ED Prescriptions    Medication Sig  Dispense Auth. Provider   naproxen (NAPROSYN) 375 MG tablet Take 1 tablet (375 mg total) by mouth 2 (two) times daily. 30 tablet Lamptey, Myrene Galas, MD     Controlled Substance Prescriptions [place] Controlled Substance Registry consulted? No   Chase Picket, MD 10/17/18 1929

## 2018-12-09 ENCOUNTER — Other Ambulatory Visit: Payer: Self-pay

## 2018-12-09 ENCOUNTER — Ambulatory Visit (HOSPITAL_COMMUNITY)
Admission: EM | Admit: 2018-12-09 | Discharge: 2018-12-09 | Disposition: A | Discharge status: Home / Self Care | Payer: Self-pay | Attending: Family Medicine | Admitting: Family Medicine

## 2018-12-09 ENCOUNTER — Encounter (HOSPITAL_COMMUNITY): Payer: Self-pay

## 2018-12-09 DIAGNOSIS — H9201 Otalgia, right ear: Secondary | ICD-10-CM

## 2018-12-09 MED ORDER — AMOXICILLIN-POT CLAVULANATE 875-125 MG PO TABS: 1.0000 | ORAL_TABLET | Freq: Two times a day (BID) | ORAL | 0 refills | Status: DC

## 2018-12-09 MED ORDER — TRAMADOL HCL 50 MG PO TABS: 50.0000 mg | ORAL_TABLET | Freq: Two times a day (BID) | ORAL | 0 refills | Status: DC | PRN

## 2018-12-09 MED ORDER — KETOROLAC TROMETHAMINE 30 MG/ML IJ SOLN: 30.0000 mg | Freq: Once | INTRAMUSCULAR | Status: DC

## 2018-12-09 NOTE — Discharge Instructions (Signed)
Take the medication as prescribed. If symptoms continue or worsen you need to see an ear nose and throat specialist Tramadol for severe pain every 12 hours as needed. You can continue with the ibuprofen as needed Take the antibiotic with food twice a day Toradol given here for pain

## 2018-12-09 NOTE — ED Triage Notes (Signed)
Pt states she has pain in her right ear. Pt states she thinks she has a insect in her ear x 2 weeks.

## 2018-12-10 NOTE — ED Provider Notes (Signed)
[place]    CSN: [id number] Arrival date & time: 12/09/18  9373     History   Chief Complaint Chief Complaint  Patient presents with  . Otalgia    HPI Kimberly Jefferson is a 53 y.o. female.   Pt is a 53 year old female that presents with right ear discomfort. This has been present for the past 2 weeks. She has had some auricle swelling. There has also been some redness. Denies any drainage from the ear. Describing weird sensation in the ear. No foreign body in the ear. She has not been swimming.  She has been taking ibuprofen and applying ice to the ear with some relief.  This decrease in the swelling.  Denies any associated nasal congestion, rhinorrhea, headache, cough, chest congestion or fever.   ROS per HPI      Past Medical History:  Diagnosis Date  . Anxiety    no meds  . Dysmenorrhea   . Heart murmur    "nothing to worry about" dx child, no problems ever  . History of blood transfusion  2013   heavy periods, MC 2 units transfuesd  . HTN (hypertension) 03/12/2012   one time no med  . Hypertension    one time  . Pneumonia     hx "had it once a year for a few years"  last time was 3- 4 years ago.  . SVD (spontaneous vaginal delivery)    x 2    Patient Active Problem List   Diagnosis Date Noted  . Vitamin B12 deficiency anemia 03/22/2016  . Heavy menstrual bleeding 03/22/2016  . URI (upper respiratory infection) 03/22/2016  . Folate deficiency 03/22/2016  . Uterine fibroid 03/22/2016  . Increased endometrial stripe 03/22/2016  . Ankle fracture, right 06/18/2012  . Hypokalemia 03/13/2012  . Chest pain 03/12/2012  . HTN (hypertension) 03/12/2012  . Trichomonas 03/12/2012  . Left sided numbness 03/11/2012  . Syncope 03/11/2012  . Anemia 03/11/2012    Past Surgical History:  Procedure Laterality Date  . [place]   x 1  . CHOLECYSTECTOMY  1990  . ORIF ANKLE FRACTURE Right 06/18/2012   Procedure: OPEN REDUCTION INTERNAL  FIXATION (ORIF) ANKLE FRACTURE;  Surgeon: Augustin Schooling, MD;  Location: [facility];  Service: Orthopedics;  Laterality: Right;  . TUBAL LIGATION  1990    OB History   No obstetric history on file.      Home Medications    Prior to Admission medications   Medication Sig Start Date End Date Taking? Authorizing Provider  amoxicillin-clavulanate (AUGMENTIN) 875-125 MG tablet Take 1 tablet by mouth every 12 (twelve) hours. 12/09/18   Loura Halt A, NP  naproxen (NAPROSYN) 375 MG tablet Take 1 tablet (375 mg total) by mouth 2 (two) times daily. 10/17/18   Lamptey, Myrene Galas, MD  traMADol (ULTRAM) 50 MG tablet Take 1 tablet (50 mg total) by mouth every 12 (twelve) hours as needed. 12/09/18   Loura Halt A, NP  albuterol (PROVENTIL HFA;VENTOLIN HFA) 108 (90 Base) MCG/ACT inhaler Inhale 2 puffs into the lungs every 4 (four) hours as needed for wheezing or shortness of breath. Patient not taking: Reported on 01/30/2018 12/17/17 10/17/18  Carlisle Cater, PA-C    Family History Family History  Problem Relation Age of Onset  . Healthy Mother   . Healthy Father   . Breast cancer Maternal Aunt     Social History Social History   Tobacco Use  . Smoking status:  Current Every Day Smoker    Packs/day: 0.50    Years: 20.00    Pack years: 10.00    Types: Cigarettes  . Smokeless tobacco: Never Used  Substance Use Topics  . Alcohol use: No  . Drug use: No     Allergies   Bee venom and Doxycycline   Review of Systems Review of Systems   Physical Exam Triage Vital Signs ED Triage Vitals  Enc Vitals Group     BP 12/09/18 0856 (!) 157/87     Pulse Rate 12/09/18 0856 68     Resp 12/09/18 0856 18     Temp 12/09/18 0856 98.3 F (36.8 C)     Temp src --      SpO2 12/09/18 0856 98 %     Weight 12/09/18 0855 166 lb (75.3 kg)     Height --      Head Circumference --      Peak Flow --      Pain Score 12/09/18 0855 8     Pain Loc --      Pain Edu? --      Excl. in [place]? --    No data found.   Updated Vital Signs BP (!) 157/87 (BP Location: Right Arm)   Pulse 68   Temp 98.3 F (36.8 C)   Resp 18   Wt 166 lb (75.3 kg)   LMP 08/16/2018   SpO2 98%   BMI 26.00 kg/m   Visual Acuity Right Eye Distance:   Left Eye Distance:   Bilateral Distance:    Right Eye Near:   Left Eye Near:    Bilateral Near:     Physical Exam Vitals signs and nursing note reviewed.  Constitutional:      Appearance: Normal appearance.     Comments: Appear in pain  HENT:     Head: Normocephalic and atraumatic.     Right Ear: Tympanic membrane normal.     Left Ear: Tympanic membrane and external ear normal.     Ears:     Comments: Mild swelling and erythema to right auricle.  Some tenderness to palpation of tragus. No open wounds, sores or drainage. Internal canal normal and TM normal Patient showed picture of right ear that was more swollen a few days ago.    Nose: Nose normal.  Eyes:     Conjunctiva/sclera: Conjunctivae normal.  Neck:     Musculoskeletal: Normal range of motion.  Pulmonary:     Effort: Pulmonary effort is normal.  Musculoskeletal: Normal range of motion.  Skin:    General: Skin is warm and dry.  Neurological:     Mental Status: She is alert.  Psychiatric:        Mood and Affect: Mood normal.      UC Treatments / Results  Labs (all labs ordered are listed, but only abnormal results are displayed) Labs Reviewed - No data to display  EKG   Radiology No results found.  Procedures Procedures (including critical care time)  Medications Ordered in UC Medications - No data to display  Initial Impression / Assessment and Plan / UC Course  I have reviewed the triage vital signs and the nursing notes.  Pertinent labs & imaging results that were available during my care of the patient were reviewed by me and considered in my medical decision making (see chart for details).     Treating for possible  cellulitis of the right auricle with Augmentin.  Recommended close watch  and follow up as needed.  Tramadol for pain  Final Clinical Impressions(s) / UC Diagnoses   Final diagnoses:  Right ear pain     Discharge Instructions     Take the medication as prescribed. If symptoms continue or worsen you need to see an ear nose and throat specialist Tramadol for severe pain every 12 hours as needed. You can continue with the ibuprofen as needed Take the antibiotic with food twice a day Toradol given here for pain     ED Prescriptions    Medication Sig Dispense Auth. Provider   amoxicillin-clavulanate (AUGMENTIN) 875-125 MG tablet Take 1 tablet by mouth every 12 (twelve) hours. 14 tablet Keyasha Miah A, NP   traMADol (ULTRAM) 50 MG tablet Take 1 tablet (50 mg total) by mouth every 12 (twelve) hours as needed. 6 tablet Loura Halt A, NP     Controlled Substance Prescriptions [place] Controlled Substance Registry consulted? yes   Orvan July, NP 12/11/18 [phone]

## 2019-04-29 ENCOUNTER — Ambulatory Visit (INDEPENDENT_AMBULATORY_CARE_PROVIDER_SITE_OTHER): Payer: 59 | Admitting: Obstetrics and Gynecology

## 2019-04-29 ENCOUNTER — Encounter: Payer: Self-pay | Admitting: Obstetrics and Gynecology

## 2019-04-29 ENCOUNTER — Other Ambulatory Visit: Payer: Self-pay

## 2019-04-29 ENCOUNTER — Encounter: Payer: Self-pay | Admitting: *Deleted

## 2019-04-29 VITALS — BP 150/79 | HR 75 | Ht 66.0 in | Wt 174.0 lb

## 2019-04-29 DIAGNOSIS — Z124 Encounter for screening for malignant neoplasm of cervix: Secondary | ICD-10-CM

## 2019-04-29 DIAGNOSIS — D259 Leiomyoma of uterus, unspecified: Secondary | ICD-10-CM

## 2019-04-29 DIAGNOSIS — Z1231 Encounter for screening mammogram for malignant neoplasm of breast: Secondary | ICD-10-CM

## 2019-04-29 DIAGNOSIS — Z1151 Encounter for screening for human papillomavirus (HPV): Secondary | ICD-10-CM | POA: Diagnosis not present

## 2019-04-29 DIAGNOSIS — Z01411 Encounter for gynecological examination (general) (routine) with abnormal findings: Secondary | ICD-10-CM

## 2019-04-29 NOTE — Patient Instructions (Signed)
Hysterectomy Information  A hysterectomy is a surgery in which the uterus is removed. The fallopian tubes and ovaries may be removed (bilateral salpingo-oophorectomy) as well. This procedure may be done to treat various medical problems. After the procedure, a woman will no longer have menstrual periods nor will she be able to become pregnant (sterile). What are the reasons for a hysterectomy? There are many reasons why a woman might have this procedure. They include:  Persistent, abnormal vaginal bleeding.  Long-term (chronic) pelvic pain or infection.  Endometriosis. This is when the lining of the uterus (endometrium) starts to grow outside the uterus.  Adenomyosis. This is when the endometrium starts to grow in the muscle of the uterus.  Pelvic organ prolapse. This is a condition in which the uterus falls down into the vagina.  Noncancerous growths in the uterus (uterine fibroids) that cause symptoms.  The presence of precancerous cells.  Cervical or uterine cancer. What are the different types of hysterectomy? There are three different types of hysterectomy:  Supracervical hysterectomy. In this type, the top part of the uterus is removed, but not the cervix.  Total hysterectomy. In this type, the uterus and cervix are removed.  Radical hysterectomy. In this type, the uterus, the cervix, and the tissue that holds the uterus in place (parametrium) are removed. What are the different ways a hysterectomy can be performed? There are many different ways a hysterectomy can be performed, including:  Abdominal hysterectomy. In this type, an incision is made in the abdomen. The uterus is removed through this incision.  Vaginal hysterectomy. In this type, an incision is made in the vagina. The uterus is removed through this incision. There are no abdominal incisions.  Conventional laparoscopic hysterectomy. In this type, three or four small incisions are made in the abdomen. A thin,  lighted tube with a camera (laparoscope) is inserted into one of the incisions. Other tools are put through the other incisions. The uterus is cut into small pieces. The small pieces are removed through the incisions or through the vagina.  Laparoscopically assisted vaginal hysterectomy (LAVH). In this type, three or four small incisions are made in the abdomen. Part of the surgery is performed laparoscopically and the other part is done vaginally. The uterus is removed through the vagina.  Robot-assisted laparoscopic hysterectomy. In this type, a laparoscope and other tools are inserted into three or four small incisions in the abdomen. A computer-controlled device is used to give the surgeon a 3D image and to help control the surgical instruments. This allows for more precise movements of surgical instruments. The uterus is cut into small pieces and removed through the incisions or removed through the vagina. Discuss the options with your health care provider to determine which type is the right one for you. What are the risks? Generally, this is a safe procedure. However, problems may occur, including:  Bleeding and risk of blood transfusion. Tell your health care provider if you do not want to receive any blood products.  Blood clots in the legs or lung.  Infection.  Damage to other structures or organs.  Allergic reactions to medicines.  Changing to an abdominal hysterectomy from one of the other techniques. What to expect after a hysterectomy  You will be given pain medicine.  You may need to stay in the hospital for 1- 2 days to recover, depending on the type of hysterectomy you had.  Follow your health care provider's instructions about exercise, driving, and general activities. Ask your   health care provider what activities are safe for you.  You will need to have someone with you for the first 3-5 days after you go home.  You will need to follow up with your surgeon in 2-4  weeks after surgery to evaluate your progress.  If the ovaries are removed, you will have early menopause symptoms such as hot flashes, night sweats, and insomnia.  If you had a hysterectomy for a problem that was not cancer or not a condition that could lead to cancer, then you no longer need Pap tests. However, even if you no longer need a Pap test, a regular pelvic exam is a good idea to make sure no other problems are developing. Questions to ask your health care provider  Is a hysterectomy medically necessary? Do I have other treatment options for my condition?  What are my options for hysterectomy procedure?  What organs and tissues need to be removed?  What are the risks?  What are the benefits?  How long will I need to stay in the hospital after the procedure?  How long will I need to recover at home?  What symptoms can I expect after the procedure? Summary  A hysterectomy is a surgery in which the uterus is removed. The fallopian tubes and ovaries may be removed (bilateral salpingo-oophorectomy) as well.  This procedure may be done to treat various medical problems. After the procedure, a woman will no longer have menstrual periods nor will she be able to become pregnant.  Discuss the options with your health care provider to determine which type of hysterectomy is the right one for you. This information is not intended to replace advice given to you by your health care provider. Make sure you discuss any questions you have with your health care provider. Document Revised: 03/22/2017 Document Reviewed: 05/16/2016 Elsevier Patient Education  2020 Elsevier Inc.  

## 2019-04-29 NOTE — Progress Notes (Signed)
Subjective:     Kimberly Jefferson is a 54 y.o. female P3 with BMI 28.08 who is here for a comprehensive physical exam. The patient reports irregular menses and vasomotor symptoms. Patient reports skipping periods intermittently followed by a heavy period lasting 7-10 days. Patient reports onset of hot flashes 6 months ago. She denies any pelvic pain or abnormal discharge. Patient is not sexually active. Patient with known fibroid uterus and is ready for surgical interventions given irregular bleeding  Past Medical History:  Diagnosis Date  . Anxiety    no meds  . Dysmenorrhea   . Heart murmur    "nothing to worry about" dx child, no problems ever  . History of blood transfusion  2013   heavy periods, MC 2 units transfuesd  . HTN (hypertension) 03/12/2012   one time no med  . Hypertension    one time  . Pneumonia     hx "had it once a year for a few years"  last time was 3- 4 years ago.  . SVD (spontaneous vaginal delivery)    x 2   Past Surgical History:  Procedure Laterality Date  . [place]   x 1  . CHOLECYSTECTOMY  1990  . ORIF ANKLE FRACTURE Right 06/18/2012   Procedure: OPEN REDUCTION INTERNAL FIXATION (ORIF) ANKLE FRACTURE;  Surgeon: Augustin Schooling, MD;  Location: [facility];  Service: Orthopedics;  Laterality: Right;  . TUBAL LIGATION  1990   Family History  Problem Relation Age of Onset  . Healthy Mother   . Healthy Father   . Breast cancer Maternal Aunt     Social History   Socioeconomic History  . Marital status: Divorced    Spouse name: Not on file  . Number of children: Not on file  . Years of education: Not on file  . Highest education level: Not on file  Occupational History  . Not on file  Tobacco Use  . Smoking status: Current Every Day Smoker    Packs/day: 0.50    Years: 20.00    Pack years: 10.00    Types: Cigarettes  . Smokeless tobacco: Never Used  Substance and Sexual Activity  . Alcohol use: No  . Drug use: No  . Sexual activity:  Yes    Birth control/protection: Surgical  Other Topics Concern  . Not on file  Social History Narrative  . Not on file   Social Determinants of Health   Financial Resource Strain:   . Difficulty of Paying Living Expenses: Not on file  Food Insecurity:   . Worried About Charity fundraiser in the Last Year: Not on file  . Ran Out of Food in the Last Year: Not on file  Transportation Needs:   . Lack of Transportation (Medical): Not on file  . Lack of Transportation (Non-Medical): Not on file  Physical Activity:   . Days of Exercise per Week: Not on file  . Minutes of Exercise per Session: Not on file  Stress:   . Feeling of Stress : Not on file  Social Connections:   . Frequency of Communication with Friends and Family: Not on file  . Frequency of Social Gatherings with Friends and Family: Not on file  . Attends Religious Services: Not on file  . Active Member of Clubs or Organizations: Not on file  . Attends Archivist Meetings: Not on file  . Marital Status: Not on file  Intimate Partner Violence:   . Fear  of Current or Ex-Partner: Not on file  . Emotionally Abused: Not on file  . Physically Abused: Not on file  . Sexually Abused: Not on file   Health Maintenance  Topic Date Due  . TETANUS/TDAP  07/15/1986  . MAMMOGRAM  07/14/2017  . COLONOSCOPY  07/14/2017  . INFLUENZA VACCINE  11/22/2018  . PAP SMEAR-Modifier  04/12/2019  . HIV Screening  Completed       Review of Systems Pertinent items noted in HPI and remainder of comprehensive ROS otherwise negative.   Objective:  Blood pressure (!) 150/79, pulse 75, height 5\' 6"  (1.676 m), weight 174 lb (78.9 kg), last menstrual period 03/08/2019.     GENERAL: Well-developed, well-nourished female in no acute distress.  HEENT: Normocephalic, atraumatic. Sclerae anicteric.  NECK: Supple. Normal thyroid.  LUNGS: Clear to auscultation bilaterally.  HEART: Regular rate and rhythm. BREASTS: Symmetric in size. No  palpable masses or lymphadenopathy, skin changes, or nipple drainage. ABDOMEN: Soft, nontender, nondistended. Palpable fibroid uterus PELVIC: Normal external female genitalia. Vagina is pink and rugated.  Normal discharge. Normal appearing cervix. Uterus is 18-weeks in size. No adnexal mass or tenderness. EXTREMITIES: No cyanosis, clubbing, or edema, 2+ distal pulses.    Assessment:    Healthy female exam.      Plan:    Pap smear collected Screening mammogram ordered Pelvic ultrasound ordered Discussed surgical management with abdominal hysterectomy with bilateral salpingectomy. Risks, benefits and alterantives were explained including but not limited to risks of bleeding, infection and damage to adjacent organs. Patient verbalized understanding and all questions were answered.   See After Visit Summary for Counseling Recommendations

## 2019-05-06 ENCOUNTER — Other Ambulatory Visit: Payer: Self-pay

## 2019-05-06 ENCOUNTER — Ambulatory Visit (HOSPITAL_COMMUNITY)
Admission: RE | Admit: 2019-05-06 | Discharge: 2019-05-06 | Disposition: A | Discharge status: Home / Self Care | Payer: 59 | Source: Ambulatory Visit | Attending: Obstetrics and Gynecology | Admitting: Obstetrics and Gynecology

## 2019-05-06 DIAGNOSIS — D259 Leiomyoma of uterus, unspecified: Secondary | ICD-10-CM | POA: Diagnosis present

## 2019-05-07 LAB — CYTOLOGY - PAP
Comment: NEGATIVE
Comment: NEGATIVE
Diagnosis: UNDETERMINED — AB
HPV 16: NEGATIVE
HPV 18 / 45: NEGATIVE
High risk HPV: POSITIVE — AB

## 2019-05-08 ENCOUNTER — Telehealth: Payer: Self-pay | Admitting: *Deleted

## 2019-05-08 NOTE — Telephone Encounter (Signed)
I called Kimberly Jefferson and reviewed results and recomendations per Dr. Elly Modena and that it had to be done before her surgery. I gave her the appointment for her colposcopy 06/10/19 at 0855. I explained what a colposcopy is and answered her questions. She voices understanding. Dhriti Fales,RN

## 2019-05-08 NOTE — Telephone Encounter (Signed)
-----   Message from Mora Bellman, MD sent at 05/08/2019 10:33 AM EST ----- Please inform patient of abnormal pap smear and need for colposcopy before her 06/16/2019 hysterectomy  Thanks  Vickii Chafe

## 2019-05-11 ENCOUNTER — Observation Stay (HOSPITAL_COMMUNITY)
Admission: EM | Admit: 2019-05-11 | Discharge: 2019-05-12 | Disposition: A | Discharge status: Home / Self Care | Payer: 59 | Attending: Emergency Medicine | Admitting: Emergency Medicine

## 2019-05-11 ENCOUNTER — Other Ambulatory Visit: Payer: Self-pay

## 2019-05-11 ENCOUNTER — Encounter (HOSPITAL_COMMUNITY): Payer: Self-pay | Admitting: Emergency Medicine

## 2019-05-11 DIAGNOSIS — F17210 Nicotine dependence, cigarettes, uncomplicated: Secondary | ICD-10-CM | POA: Insufficient documentation

## 2019-05-11 DIAGNOSIS — Z20822 Contact with and (suspected) exposure to covid-19: Secondary | ICD-10-CM | POA: Diagnosis not present

## 2019-05-11 DIAGNOSIS — R1030 Lower abdominal pain, unspecified: Secondary | ICD-10-CM | POA: Diagnosis present

## 2019-05-11 DIAGNOSIS — N939 Abnormal uterine and vaginal bleeding, unspecified: Secondary | ICD-10-CM | POA: Diagnosis present

## 2019-05-11 DIAGNOSIS — D259 Leiomyoma of uterus, unspecified: Secondary | ICD-10-CM | POA: Diagnosis present

## 2019-05-11 DIAGNOSIS — I10 Essential (primary) hypertension: Secondary | ICD-10-CM | POA: Diagnosis not present

## 2019-05-11 DIAGNOSIS — D649 Anemia, unspecified: Secondary | ICD-10-CM | POA: Diagnosis present

## 2019-05-11 DIAGNOSIS — D500 Iron deficiency anemia secondary to blood loss (chronic): Principal | ICD-10-CM

## 2019-05-11 HISTORY — DX: Benign neoplasm of connective and other soft tissue, unspecified: D21.9

## 2019-05-11 LAB — I-STAT BETA HCG BLOOD, ED (MC, WL, AP ONLY): I-stat hCG, quantitative: <5 m[IU]/mL (ref <5)

## 2019-05-11 MED ORDER — TRAMADOL HCL 50 MG PO TABS: 50.0000 mg | ORAL_TABLET | Freq: Four times a day (QID) | ORAL | 0 refills | Status: DC | PRN

## 2019-05-11 NOTE — ED Triage Notes (Addendum)
Pt to ED with c/o lower abd pain   Pt st's she has fibroids and is scheduled for surg on 2/23.   Pt st's her MD called in Tramadol but its not helping  Pt also c/o heavy vag. bleeding

## 2019-05-12 ENCOUNTER — Other Ambulatory Visit: Payer: Self-pay

## 2019-05-12 ENCOUNTER — Encounter (HOSPITAL_COMMUNITY): Payer: Self-pay | Admitting: Radiology

## 2019-05-12 ENCOUNTER — Emergency Department (HOSPITAL_COMMUNITY): Payer: 59

## 2019-05-12 DIAGNOSIS — D649 Anemia, unspecified: Secondary | ICD-10-CM | POA: Diagnosis present

## 2019-05-12 DIAGNOSIS — N939 Abnormal uterine and vaginal bleeding, unspecified: Secondary | ICD-10-CM | POA: Diagnosis present

## 2019-05-12 DIAGNOSIS — D500 Iron deficiency anemia secondary to blood loss (chronic): Secondary | ICD-10-CM

## 2019-05-12 LAB — CBC WITH DIFFERENTIAL/PLATELET
Abs Immature Granulocytes: 0.11 10*3/uL — ABNORMAL HIGH (ref 0.00–0.07)
Basophils Absolute: 0.1 10*3/uL (ref 0.0–0.1)
Basophils Relative: 0 %
Eosinophils Absolute: 0.1 10*3/uL (ref 0.0–0.5)
Eosinophils Relative: 1 %
HCT: 26 % — ABNORMAL LOW (ref 36.0–46.0)
Hemoglobin: 6.5 g/dL — CL (ref 12.0–15.0)
Immature Granulocytes: 1 %
Lymphocytes Relative: 9 %
Lymphs Abs: 1.3 10*3/uL (ref 0.7–4.0)
MCH: 16.3 pg — ABNORMAL LOW (ref 26.0–34.0)
MCHC: 25 g/dL — ABNORMAL LOW (ref 30.0–36.0)
MCV: 65 fL — ABNORMAL LOW (ref 80.0–100.0)
Monocytes Absolute: 1 10*3/uL (ref 0.1–1.0)
Monocytes Relative: 6 %
Neutro Abs: 12.8 10*3/uL — ABNORMAL HIGH (ref 1.7–7.7)
Neutrophils Relative %: 83 %
Platelets: 404 10*3/uL — ABNORMAL HIGH (ref 150–400)
RBC: 4 MIL/uL (ref 3.87–5.11)
RDW: 23.4 % — ABNORMAL HIGH (ref 11.5–15.5)
WBC: 15.4 10*3/uL — ABNORMAL HIGH (ref 4.0–10.5)
nRBC: 0 % (ref 0.0–0.2)

## 2019-05-12 LAB — COMPREHENSIVE METABOLIC PANEL
ALT: 12 U/L (ref 0–44)
AST: 17 U/L (ref 15–41)
Albumin: 3.7 g/dL (ref 3.5–5.0)
Alkaline Phosphatase: 74 U/L (ref 38–126)
Anion gap: 9 (ref 5–15)
BUN: 7 mg/dL (ref 6–20)
CO2: 22 mmol/L (ref 22–32)
Calcium: 8.6 mg/dL — ABNORMAL LOW (ref 8.9–10.3)
Chloride: 105 mmol/L (ref 98–111)
Creatinine, Ser: 0.78 mg/dL (ref 0.44–1.00)
GFR calc Af Amer: >60 mL/min (ref >60)
GFR calc non Af Amer: >60 mL/min (ref >60)
Glucose, Bld: 122 mg/dL — ABNORMAL HIGH (ref 70–99)
Potassium: 3.3 mmol/L — ABNORMAL LOW (ref 3.5–5.1)
Sodium: 136 mmol/L (ref 135–145)
Total Bilirubin: 1 mg/dL (ref 0.3–1.2)
Total Protein: 6.3 g/dL — ABNORMAL LOW (ref 6.5–8.1)

## 2019-05-12 LAB — CBC
HCT: 27.2 % — ABNORMAL LOW (ref 36.0–46.0)
Hemoglobin: 7.8 g/dL — ABNORMAL LOW (ref 12.0–15.0)
MCH: 19.7 pg — ABNORMAL LOW (ref 26.0–34.0)
MCHC: 28.7 g/dL — ABNORMAL LOW (ref 30.0–36.0)
MCV: 68.9 fL — ABNORMAL LOW (ref 80.0–100.0)
Platelets: 361 10*3/uL (ref 150–400)
RBC: 3.95 MIL/uL (ref 3.87–5.11)
RDW: 25.3 % — ABNORMAL HIGH (ref 11.5–15.5)
WBC: 13.5 10*3/uL — ABNORMAL HIGH (ref 4.0–10.5)
nRBC: 0.3 % — ABNORMAL HIGH (ref 0.0–0.2)

## 2019-05-12 LAB — SARS CORONAVIRUS 2 (TAT 6-24 HRS): SARS Coronavirus 2: NEGATIVE

## 2019-05-12 LAB — PREPARE RBC (CROSSMATCH)

## 2019-05-12 MED ORDER — MEGESTROL ACETATE 40 MG PO TABS
40.0000 mg | ORAL_TABLET | Freq: Two times a day (BID) | ORAL | Status: DC
  Administered 2019-05-12: 40 mg via ORAL
  Filled 2019-05-12 (×3): qty 1

## 2019-05-12 MED ORDER — PRENATAL MULTIVITAMIN CH
1.0000 | ORAL_TABLET | Freq: Every day | ORAL | Status: DC
  Administered 2019-05-12: 14:00:00 1 via ORAL
  Filled 2019-05-12: qty 1

## 2019-05-12 MED ORDER — KETOROLAC TROMETHAMINE 30 MG/ML IJ SOLN
15.0000 mg | Freq: Four times a day (QID) | INTRAMUSCULAR | Status: DC
  Administered 2019-05-12: 15 mg via INTRAVENOUS
  Filled 2019-05-12: qty 1

## 2019-05-12 MED ORDER — IBUPROFEN 600 MG PO TABS: 600.0000 mg | ORAL_TABLET | Freq: Four times a day (QID) | ORAL | 3 refills | Status: DC | PRN

## 2019-05-12 MED ORDER — KETOROLAC TROMETHAMINE 15 MG/ML IJ SOLN
15.0000 mg | Freq: Once | INTRAMUSCULAR | Status: AC
  Administered 2019-05-12: 04:00:00 15 mg via INTRAVENOUS
  Filled 2019-05-12: qty 1

## 2019-05-12 MED ORDER — KETOROLAC TROMETHAMINE 15 MG/ML IJ SOLN
15.0000 mg | Freq: Four times a day (QID) | INTRAMUSCULAR | Status: DC
  Administered 2019-05-12: 14:00:00 15 mg via INTRAVENOUS
  Filled 2019-05-12 (×4): qty 1

## 2019-05-12 MED ORDER — SODIUM CHLORIDE 0.9% IV SOLUTION: Freq: Once | INTRAVENOUS | Status: AC

## 2019-05-12 MED ORDER — MEGESTROL ACETATE 40 MG PO TABS: 40.0000 mg | ORAL_TABLET | Freq: Two times a day (BID) | ORAL | 1 refills | Status: DC

## 2019-05-12 MED ORDER — BISACODYL 5 MG PO TBEC
5.0000 mg | DELAYED_RELEASE_TABLET | Freq: Every day | ORAL | Status: DC | PRN
  Administered 2019-05-12: 16:00:00 5 mg via ORAL
  Filled 2019-05-12: qty 1

## 2019-05-12 MED ORDER — IOHEXOL 300 MG/ML  SOLN
100.0000 mL | Freq: Once | INTRAMUSCULAR | Status: AC | PRN
  Administered 2019-05-12: 04:00:00 100 mL via INTRAVENOUS

## 2019-05-12 MED ORDER — HYDROMORPHONE HCL 1 MG/ML IJ SOLN
1.0000 mg | Freq: Once | INTRAMUSCULAR | Status: AC
  Administered 2019-05-12: 04:00:00 1 mg via INTRAVENOUS
  Filled 2019-05-12: qty 1

## 2019-05-12 MED ORDER — SODIUM CHLORIDE 0.9 % IV BOLUS
1000.0000 mL | Freq: Once | INTRAVENOUS | Status: AC
  Administered 2019-05-12: 04:00:00 1000 mL via INTRAVENOUS

## 2019-05-12 NOTE — ED Notes (Signed)
Pt returned from CT °

## 2019-05-12 NOTE — Discharge Summary (Signed)
Physician Discharge Summary  Patient ID: Kimberly Jefferson MRN: [id number] DOB/AGE: May 01, 1965 54 y.o.  Admit date: 05/11/2019 Discharge date: 05/12/2019  Admission Diagnoses:  Discharge Diagnoses:  Principal Problem:   Symptomatic anemia Active Problems:   HTN (hypertension)   Uterine fibroid   Iron deficiency anemia due to chronic blood loss   Abnormal uterine bleeding (AUB)   Discharged Condition: stable  Hospital Course: Patient admitted with symptomatic anemia from AUB due to uterine fibroid.   Consults: None  Significant Diagnostic Studies: Initial Hg: 6.5. Hg after transfusion: 7.8.  Treatments: blood transfusion  Discharge Exam: Blood pressure (!) 143/76, pulse 72, temperature 98.4 F (36.9 C), temperature source Oral, resp. rate 18, height 5\' 7"  (1.702 m), weight 77.1 kg, last menstrual period 05/10/2019, SpO2 100 %. General appearance: alert, cooperative and no distress Head: Normocephalic, without obvious abnormality, atraumatic Resp: clear to auscultation bilaterally Cardio: regular rate and rhythm, S1, S2 normal, no murmur, click, rub or gallop GI: soft, non-tender; bowel sounds normal; no masses,  no organomegaly Extremities: extremities normal, atraumatic, no cyanosis or edema and Homans sign is negative, no sign of DVT Pulses: 2+ and symmetric  Disposition: Discharge disposition: 01-Home or Self Care       Discharge Instructions    Call MD for:  difficulty breathing, headache or visual disturbances   Complete by: As directed    Call MD for:  extreme fatigue   Complete by: As directed    Call MD for:  hives   Complete by: As directed    Call MD for:  persistant dizziness or light-headedness   Complete by: As directed    Call MD for:  persistant nausea and vomiting   Complete by: As directed    Call MD for:  redness, tenderness, or signs of infection (pain, swelling, redness, odor or green/yellow discharge around incision site)   Complete by: As  directed    Call MD for:  severe uncontrolled pain   Complete by: As directed    Call MD for:  temperature >100.4   Complete by: As directed    Diet - low sodium heart healthy   Complete by: As directed    Increase activity slowly   Complete by: As directed      Allergies as of 05/12/2019      Reactions   Bee Venom Anaphylaxis   Doxycycline Nausea Only      Medication List    TAKE these medications   megestrol 40 MG tablet Commonly known as: MEGACE Take 1 tablet (40 mg total) by mouth 2 (two) times daily.   traMADol 50 MG tablet Commonly known as: ULTRAM Take 1 tablet (50 mg total) by mouth every 6 (six) hours as needed for severe pain.      Follow-up [place] for [facility] Follow up.   Specialty: Obstetrics and Gynecology Contact information: [place] [address], [place] [id number] [place] [phone]          Signed: Truett Mainland 05/12/2019, 7:56 PM

## 2019-05-12 NOTE — ED Notes (Signed)
Pt transported to CT ?

## 2019-05-12 NOTE — ED Provider Notes (Signed)
[facility] EMERGENCY DEPARTMENT Provider Note  CSN: [id number] Arrival date & time: 05/11/19 2246  Chief Complaint(s) Abdominal Pain  HPI Kimberly Jefferson is a 54 y.o. female with a history of dysmenorrhagia and large uterine fibroids scheduled for hysterectomy presents to the emergency department with severe lower abdominal pain for 2 days and vaginal bleeding.  Patient reports that she has not had a menstrual cycle for 2 cycles.  Her cycle began 2 days ago.  Since then she has had this extreme pain that is worse with movement and palpation.  Patient was recently prescribed tramadol which does not provide relief.  She is taking Tylenol and ibuprofen without relief either.  Patient reports that she is going through several pads a day.  Endorses nausea without emesis.  No diarrhea.  No urinary symptoms.  She denies any other physical complaints.  HPI  Past Medical History Past Medical History:  Diagnosis Date  . Anxiety    no meds  . Dysmenorrhea   . Fibroids   . Heart murmur    "nothing to worry about" dx child, no problems ever  . History of blood transfusion  2013   heavy periods, MC 2 units transfuesd  . HTN (hypertension) 03/12/2012   one time no med  . Hypertension    one time  . Pneumonia     hx "had it once a year for a few years"  last time was 3- 4 years ago.  . SVD (spontaneous vaginal delivery)    x 2   Patient Active Problem List   Diagnosis Date Noted  . Symptomatic anemia 05/12/2019  . Vitamin B12 deficiency anemia 03/22/2016  . Heavy menstrual bleeding 03/22/2016  . URI (upper respiratory infection) 03/22/2016  . Folate deficiency 03/22/2016  . Uterine fibroid 03/22/2016  . Increased endometrial stripe 03/22/2016  . Ankle fracture, right 06/18/2012  . Hypokalemia 03/13/2012  . Chest pain 03/12/2012  . HTN (hypertension) 03/12/2012  . Trichomonas 03/12/2012  . Left sided numbness 03/11/2012  . Syncope 03/11/2012  . Anemia 03/11/2012    Home Medication(s) Prior to Admission medications   Medication Sig Start Date End Date Taking? Authorizing Provider  traMADol (ULTRAM) 50 MG tablet Take 1 tablet (50 mg total) by mouth every 6 (six) hours as needed for severe pain. 05/11/19  Yes Constant, Peggy, MD  albuterol (PROVENTIL HFA;VENTOLIN HFA) 108 (90 Base) MCG/ACT inhaler Inhale 2 puffs into the lungs every 4 (four) hours as needed for wheezing or shortness of breath. Patient not taking: Reported on 01/30/2018 12/17/17 10/17/18  Carlisle Cater, PA-C                                                                                                                                    Past Surgical History Past Surgical History:  Procedure Laterality Date  . [place]   x 1  . CHOLECYSTECTOMY  1990  .  ORIF ANKLE FRACTURE Right 06/18/2012   Procedure: OPEN REDUCTION INTERNAL FIXATION (ORIF) ANKLE FRACTURE;  Surgeon: Augustin Schooling, MD;  Location: [facility];  Service: Orthopedics;  Laterality: Right;  . TUBAL LIGATION  1990   Family History Family History  Problem Relation Age of Onset  . Healthy Mother   . Healthy Father   . Breast cancer Maternal Aunt     Social History Social History   Tobacco Use  . Smoking status: Current Every Day Smoker    Packs/day: 0.50    Years: 20.00    Pack years: 10.00    Types: Cigarettes  . Smokeless tobacco: Never Used  Substance Use Topics  . Alcohol use: No  . Drug use: No   Allergies Bee venom and Doxycycline  Review of Systems Review of Systems All other systems are reviewed and are negative for acute change except as noted in the HPI  Physical Exam Vital Signs  I have reviewed the triage vital signs BP 106/72   Pulse 63   Temp 98.1 F (36.7 C) (Oral)   Resp 15   Ht 5\' 7"  (1.702 m)   Wt 77.1 kg   LMP 05/10/2019 (Exact Date)   SpO2 97%   BMI 26.63 kg/m   Physical Exam Vitals reviewed. Exam conducted with a chaperone present.  Constitutional:      General:  She is not in acute distress.    Appearance: She is well-developed. She is not diaphoretic.  HENT:     Head: Normocephalic and atraumatic.     Right Ear: External ear normal.     Left Ear: External ear normal.     Nose: Nose normal.  Eyes:     General: No scleral icterus.    Conjunctiva/sclera: Conjunctivae normal.  Neck:     Trachea: Phonation normal.  Cardiovascular:     Rate and Rhythm: Normal rate and regular rhythm.  Pulmonary:     Effort: Pulmonary effort is normal. No respiratory distress.     Breath sounds: No stridor.  Abdominal:     General: There is no distension.     Tenderness: There is abdominal tenderness in the right lower quadrant, periumbilical area, suprapubic area and left lower quadrant.  Genitourinary:    Vagina: Bleeding (approx 10-20cc in the vaginal vault. ) present.  Musculoskeletal:        General: Normal range of motion.     Cervical back: Normal range of motion.  Neurological:     Mental Status: She is alert and oriented to person, place, and time.  Psychiatric:        Behavior: Behavior normal.     ED Results and Treatments Labs (all labs ordered are listed, but only abnormal results are displayed) Labs Reviewed  CBC WITH DIFFERENTIAL/PLATELET - Abnormal; Notable for the following components:      Result Value   WBC 15.4 (*)    Hemoglobin 6.5 (*)    HCT 26.0 (*)    MCV 65.0 (*)    MCH 16.3 (*)    MCHC 25.0 (*)    RDW 23.4 (*)    Platelets 404 (*)    Neutro Abs 12.8 (*)    Abs Immature Granulocytes 0.11 (*)    All other components within normal limits  COMPREHENSIVE METABOLIC PANEL - Abnormal; Notable for the following components:   Potassium 3.3 (*)    Glucose, Bld 122 (*)    Calcium 8.6 (*)    Total Protein 6.3 (*)  All other components within normal limits  SARS CORONAVIRUS 2 (TAT 6-24 HRS)  I-STAT BETA HCG BLOOD, ED ([facility], [facility], [facility] ONLY)  TYPE AND SCREEN  PREPARE RBC (CROSSMATCH)                                                                                                                          EKG  EKG Interpretation  Date/Time:  Tuesday May 12 2019 03:57:32 EST Ventricular Rate:  68 PR Interval:    QRS Duration: 86 QT Interval:  398 QTC Calculation: 424 R Axis:   63 Text Interpretation: Sinus rhythm Consider left ventricular hypertrophy No significant change since last tracing Confirmed by Addison Lank [phone]) on 05/12/2019 4:48:27 AM      Radiology CT ABDOMEN PELVIS W CONTRAST  Result Date: 05/12/2019 CLINICAL DATA:  Lower quadrant abdominal pain. Known uterine fibroids. EXAM: CT ABDOMEN AND PELVIS WITH CONTRAST TECHNIQUE: Multidetector CT imaging of the abdomen and pelvis was performed using the standard protocol following bolus administration of intravenous contrast. CONTRAST:  165mL OMNIPAQUE IOHEXOL 300 MG/ML  SOLN COMPARISON:  Pelvic ultrasound 05/06/2019. CT of the abdomen pelvis without contrast 03/11/2013. FINDINGS: Lower chest: The lung bases are clear without focal nodule, mass, or airspace disease. The heart size is. No significant pleural or pericardial effusion is present. Hepatobiliary: No focal liver abnormality is seen. Status post cholecystectomy. No biliary dilatation. Pancreas: Unremarkable. No pancreatic ductal dilatation or surrounding inflammatory changes. Spleen: Normal in size without focal abnormality. Adrenals/Urinary Tract: Adrenal glands are normal bilaterally. Kidneys and ureters are within normal limits. No stone or mass lesion is present. The urinary bladder is within normal limits. Stomach/Bowel: Stomach and duodenum are within normal limits. Small bowel is unremarkable. Terminal ileum is normal. Appendix is visualized and within normal limits. The ascending and transverse colon are normal. The descending and sigmoid colon are normal. Vascular/Lymphatic: Atherosclerotic calcifications are present in the aorta without aneurysm. No significant adenopathy is present. Reproductive:  Complex uterine fibroid is again noted the fundus of the uterus. The fibroid measures at least 10.5 x 10.6 x 10.0 cm. There is minimal fluid in the lower uterine segment. Fluid is present within the vagina. No obstructing distal lesion is evident. Adnexa are unremarkable. Other: No abdominal wall hernia or abnormality. No abdominopelvic ascites. Musculoskeletal: Degenerative changes are present at L5-S1 with moderate facet hypertrophy and a vacuum disc. Mild disc bulging is also present at L4-5. Vertebral body heights and alignment are maintained. Bony pelvis is normal. Hips are located and within normal limits. IMPRESSION: 1. Complex uterine fibroid measures at least 10.5 cm. 2. Fluid within the vagina. No obstructing lesion is evident. This likely represents blood in the vagina given the patient's presentation of vaginal bleeding. 3. No other acute or focal lesion to explain lower abdominal pain. 4. Cholecystectomy. 5. Degenerative changes of the lower lumbar spine are most evident at L5-S1. Electronically Signed   By: San Morelle M.D.   On: 05/12/2019 04:55  Pertinent labs & imaging results that were available during my care of the patient were reviewed by me and considered in my medical decision making (see chart for details).  Medications Ordered in ED Medications  prenatal multivitamin tablet 1 tablet (has no administration in time range)  ketorolac (TORADOL) 15 MG/ML injection 15 mg (has no administration in time range)  0.9 %  sodium chloride infusion (Manually program via Guardrails IV Fluids) (has no administration in time range)  megestrol (MEGACE) tablet 40 mg (has no administration in time range)  sodium chloride 0.9 % bolus 1,000 mL (0 mLs Intravenous Stopped 05/12/19 0747)  HYDROmorphone (DILAUDID) injection 1 mg (1 mg Intravenous Given 05/12/19 0412)  ketorolac (TORADOL) 15 MG/ML injection 15 mg (15 mg Intravenous Given 05/12/19 0415)  iohexol (OMNIPAQUE) 300 MG/ML solution 100  mL (100 mLs Intravenous Contrast Given 05/12/19 0426)                                                                                                                                    Procedures .Critical Care Performed by: Fatima Blank, MD Authorized by: Fatima Blank, MD     CRITICAL CARE Performed by: Grayce Sessions Amarah Brossman Total critical care time: 40 minutes Critical care time was exclusive of separately billable procedures and treating other patients. Critical care was necessary to treat or prevent imminent or life-threatening deterioration. Critical care was time spent personally by me on the following activities: development of treatment plan with patient and/or surrogate as well as nursing, discussions with consultants, evaluation of patient's response to treatment, examination of patient, obtaining history from patient or surrogate, ordering and performing treatments and interventions, ordering and review of laboratory studies, ordering and review of radiographic studies, pulse oximetry and re-evaluation of patient's condition.   (including critical care time)  Medical Decision Making / ED Course I have reviewed the nursing notes for this encounter and the patient's prior records (if available in EHR or on provided paperwork).   Kimberly Jefferson was evaluated in Emergency Department on 05/12/2019 for the symptoms described in the history of present illness. She was evaluated in the context of the global COVID-19 pandemic, which necessitated consideration that the patient might be at risk for infection with the SARS-CoV-2 virus that causes COVID-19. Institutional protocols and algorithms that pertain to the evaluation of patients at risk for COVID-19 are in a state of rapid change based on information released by regulatory bodies including the CDC and federal and state organizations. These policies and algorithms were followed during the patient's care in the  ED.  Patient presents with dysmenorrhagia. Hemoglobin 6.5, 1 g lower than prior 1 year ago.  She is not hypotensive or tachycardic. Rest of the labs revealed leukocytosis. CT obtained to rule out other intra-abdominal F[place] to assess infectious process which was negative.  It did reveal a large greater than 10 cm fibroid. Patient provided with IV fluids,  Toradol and Dilaudid which provided significant relief.  Case discussed with Dr. Rosana Hoes who will admit the patient for transfusion.      Final Clinical Impression(s) / ED Diagnoses Final diagnoses:  Iron deficiency anemia due to chronic blood loss      This chart was dictated using voice recognition software.  Despite best efforts to proofread,  errors can occur which can change the documentation meaning.   Fatima Blank, MD 05/12/19 [phone]

## 2019-05-12 NOTE — H&P (Signed)
OB/GYN History and Physical  Kimberly Jefferson is a 54 y.o.  presenting for pain and heavy vaginal bleeding. Known fibroid uterus with plans for hysterectomy next month. Did not have period in November or December, started period on Sunday and it was light, then Monday became very heavy and had sharp pain in ED so she came in. Bleeding heavily, has been through 7 pads since coming in to ED.      Past Medical History:  Diagnosis Date  . Anxiety    no meds  . Dysmenorrhea   . Fibroids   . Heart murmur    "nothing to worry about" dx child, no problems ever  . History of blood transfusion  2013   heavy periods, MC 2 units transfuesd  . HTN (hypertension) 03/12/2012   one time no med  . Hypertension    one time  . Pneumonia     hx "had it once a year for a few years"  last time was 3- 4 years ago.  . SVD (spontaneous vaginal delivery)    x 2    Past Surgical History:  Procedure Laterality Date  . [place]   x 1  . CHOLECYSTECTOMY  1990  . ORIF ANKLE FRACTURE Right 06/18/2012   Procedure: OPEN REDUCTION INTERNAL FIXATION (ORIF) ANKLE FRACTURE;  Surgeon: Augustin Schooling, MD;  Location: [facility];  Service: Orthopedics;  Laterality: Right;  . TUBAL LIGATION  1990    OB History  No obstetric history on file.    Social History   Socioeconomic History  . Marital status: Divorced    Spouse name: Not on file  . Number of children: Not on file  . Years of education: Not on file  . Highest education level: Not on file  Occupational History  . Not on file  Tobacco Use  . Smoking status: Current Every Day Smoker    Packs/day: 0.50    Years: 20.00    Pack years: 10.00    Types: Cigarettes  . Smokeless tobacco: Never Used  Substance and Sexual Activity  . Alcohol use: No  . Drug use: No  . Sexual activity: Yes    Birth control/protection: Surgical  Other Topics Concern  . Not on file  Social History Narrative  . Not on file   Social Determinants of Health    Financial Resource Strain:   . Difficulty of Paying Living Expenses: Not on file  Food Insecurity:   . Worried About Charity fundraiser in the Last Year: Not on file  . Ran Out of Food in the Last Year: Not on file  Transportation Needs:   . Lack of Transportation (Medical): Not on file  . Lack of Transportation (Non-Medical): Not on file  Physical Activity:   . Days of Exercise per Week: Not on file  . Minutes of Exercise per Session: Not on file  Stress:   . Feeling of Stress : Not on file  Social Connections:   . Frequency of Communication with Friends and Family: Not on file  . Frequency of Social Gatherings with Friends and Family: Not on file  . Attends Religious Services: Not on file  . Active Member of Clubs or Organizations: Not on file  . Attends Archivist Meetings: Not on file  . Marital Status: Not on file    Family History  Problem Relation Age of Onset  . Healthy Mother   . Healthy Father   . Breast cancer  Maternal Aunt     (Not in a hospital admission)   Allergies  Allergen Reactions  . Bee Venom Anaphylaxis  . Doxycycline Nausea Only    Review of Systems: Negative except for what is mentioned in HPI.     Physical Exam: BP 106/72   Pulse 63   Temp 98.1 F (36.7 C) (Oral)   Resp 15   Ht 5\' 7"  (1.702 m)   Wt 77.1 kg   LMP 05/10/2019 (Exact Date)   SpO2 97%   BMI 26.63 kg/m  CONSTITUTIONAL: Well-developed, well-nourished female in no acute distress.  HENT:  Normocephalic, atraumatic, External right and left ear normal. Oropharynx is clear and moist EYES: Conjunctivae and EOM are normal. Pupils are equal, round, and reactive to light. No scleral icterus.  NECK: Normal range of motion, supple, no masses SKIN: Skin is warm and dry. No rash noted. Not diaphoretic. No erythema. No pallor. [place]: Alert and oriented to person, place, and time. Normal reflexes, muscle tone coordination. No cranial nerve deficit noted. PSYCHIATRIC:  Normal mood and affect. Normal behavior. Normal judgment and thought content. CARDIOVASCULAR: Normal heart rate noted RESPIRATORY: Effortnormal, no problems with respiration noted ABDOMEN: Soft, enlarged and moderately tender fibroid uterus PELVIC: Deferred MUSCULOSKELETAL: Normal range of motion. No edema and no tenderness. 2+ distal pulses.   Pertinent Labs/Studies:   Results for orders placed or performed during the hospital encounter of 05/11/19 (from the past 72 hour(s))  Type and screen [place]     Status: None (Preliminary result)   Collection Time: 05/11/19 11:28 PM  Result Value Ref Range   ABO/RH(D) O POS    Antibody Screen POS    Sample Expiration 05/14/2019,2359    Antibody Identification NO CLINICALLY SIGNIFICANT ANTIBODY IDENTIFIED    DAT, IgG      NEG Performed at [facility], [address], [place], [place] [address]    Unit Number [id number]    Blood Component Type RED CELLS,LR    Unit division 00    Status of Unit ALLOCATED    Transfusion Status OK TO TRANSFUSE    Crossmatch Result COMPATIBLE    Unit Number [id number]    Blood Component Type RED CELLS,LR    Unit division 00    Status of Unit ALLOCATED    Transfusion Status OK TO TRANSFUSE    Crossmatch Result COMPATIBLE   I-Stat Beta hCG blood, ED ([facility], [facility], [facility] only)     Status: None   Collection Time: 05/11/19 11:44 PM  Result Value Ref Range   I-stat hCG, quantitative <5.0 <5 mIU/mL   Comment 3            Comment:   GEST. AGE      CONC.  (mIU/mL)   <=1 WEEK        5 - 50     2 WEEKS       50 - 500     3 WEEKS       100 - 10,000     4 WEEKS     1,000 - 30,000        FEMALE AND NON-PREGNANT FEMALE:     LESS THAN 5 mIU/mL   CBC with Differential     Status: Abnormal   Collection Time: 05/11/19 11:46 PM  Result Value Ref Range   WBC 15.4 (H) 4.0 - 10.5 K/uL   RBC 4.00 3.87 - 5.11 MIL/uL   Hemoglobin 6.5 (LL) 12.0 - 15.0 g/dL  Comment: REPEATED TO  VERIFY Reticulocyte Hemoglobin testing may be clinically indicated, consider ordering this additional test [id number] THIS CRITICAL RESULT HAS VERIFIED AND BEEN CALLED TO K.FIELDS,RN BY MELISSA BROGDON ON 01 18 2021 AT 2359, AND HAS BEEN READ BACK.     HCT 26.0 (L) 36.0 - 46.0 %   MCV 65.0 (L) 80.0 - 100.0 fL   MCH 16.3 (L) 26.0 - 34.0 pg   MCHC 25.0 (L) 30.0 - 36.0 g/dL   RDW 23.4 (H) 11.5 - 15.5 %   Platelets 404 (H) 150 - 400 K/uL   nRBC 0.0 0.0 - 0.2 %   Neutrophils Relative % 83 %   Neutro Abs 12.8 (H) 1.7 - 7.7 K/uL   Lymphocytes Relative 9 %   Lymphs Abs 1.3 0.7 - 4.0 K/uL   Monocytes Relative 6 %   Monocytes Absolute 1.0 0.1 - 1.0 K/uL   Eosinophils Relative 1 %   Eosinophils Absolute 0.1 0.0 - 0.5 K/uL   Basophils Relative 0 %   Basophils Absolute 0.1 0.0 - 0.1 K/uL   Immature Granulocytes 1 %   Abs Immature Granulocytes 0.11 (H) 0.00 - 0.07 K/uL   Polychromasia PRESENT    Ovalocytes PRESENT     Comment: Performed at [facility], [address], [place], [place] [address]  Comprehensive metabolic panel     Status: Abnormal   Collection Time: 05/11/19 11:46 PM  Result Value Ref Range   Sodium 136 135 - 145 mmol/L   Potassium 3.3 (L) 3.5 - 5.1 mmol/L   Chloride 105 98 - 111 mmol/L   CO2 22 22 - 32 mmol/L   Glucose, Bld 122 (H) 70 - 99 mg/dL   BUN 7 6 - 20 mg/dL   Creatinine, Ser 0.78 0.44 - 1.00 mg/dL   Calcium 8.6 (L) 8.9 - 10.3 mg/dL   Total Protein 6.3 (L) 6.5 - 8.1 g/dL   Albumin 3.7 3.5 - 5.0 g/dL   AST 17 15 - 41 U/L   ALT 12 0 - 44 U/L   Alkaline Phosphatase 74 38 - 126 U/L   Total Bilirubin 1.0 0.3 - 1.2 mg/dL   GFR calc non Af Amer >60 >60 mL/min   GFR calc Af Amer >60 >60 mL/min   Anion gap 9 5 - 15    Comment: Performed at [facility] [address] [place], [place] [address]       Assessment and Plan :ELVIN BELLVILLE is a 54 y.o. admitted for anemia with H/H of 6.5/26.0 secondary to heavy menstrual bleeding. Recommended blood  transfusion, reviewed risks/benefits she is agreeable. Will start megace for heavy, ongoing menses. Has hysterectomy scheduled for next month.   Plan for 2 units PRBCs Regular diet Megace 40 mg BID Likely discharge home after transfusion   Feliz Beam, M.D. Attending [place], [facility] for [employer], [place]

## 2019-05-12 NOTE — Progress Notes (Deleted)
Discharge instructions and follow up care reviewed with patient. Patient verbalized understanding. 

## 2019-05-13 LAB — TYPE AND SCREEN
ABO/RH(D): O POS
Antibody Screen: POSITIVE
DAT, IgG: NEGATIVE
Unit division: 0
Unit division: 0

## 2019-05-13 LAB — BPAM RBC
Blood Product Expiration Date: 202102122359
Blood Product Expiration Date: 202102122359
ISSUE DATE / TIME: 202101190904
ISSUE DATE / TIME: 202101191157
Unit Type and Rh: 5100
Unit Type and Rh: 5100

## 2019-06-09 ENCOUNTER — Other Ambulatory Visit: Payer: Self-pay

## 2019-06-09 ENCOUNTER — Ambulatory Visit
Admission: RE | Admit: 2019-06-09 | Discharge: 2019-06-09 | Disposition: A | Discharge status: Home / Self Care | Payer: 59 | Source: Ambulatory Visit | Attending: Obstetrics and Gynecology | Admitting: Obstetrics and Gynecology

## 2019-06-09 DIAGNOSIS — Z1231 Encounter for screening mammogram for malignant neoplasm of breast: Secondary | ICD-10-CM

## 2019-06-09 DIAGNOSIS — Z01411 Encounter for gynecological examination (general) (routine) with abnormal findings: Secondary | ICD-10-CM

## 2019-06-10 ENCOUNTER — Ambulatory Visit (INDEPENDENT_AMBULATORY_CARE_PROVIDER_SITE_OTHER): Payer: 59 | Admitting: Obstetrics and Gynecology

## 2019-06-10 ENCOUNTER — Other Ambulatory Visit (HOSPITAL_COMMUNITY)
Admission: RE | Admit: 2019-06-10 | Discharge: 2019-06-10 | Disposition: A | Discharge status: Home / Self Care | Payer: 59 | Source: Ambulatory Visit | Attending: Obstetrics and Gynecology | Admitting: Obstetrics and Gynecology

## 2019-06-10 ENCOUNTER — Other Ambulatory Visit: Payer: Self-pay | Admitting: Obstetrics and Gynecology

## 2019-06-10 ENCOUNTER — Encounter: Payer: Self-pay | Admitting: Obstetrics and Gynecology

## 2019-06-10 ENCOUNTER — Other Ambulatory Visit: Payer: Self-pay

## 2019-06-10 DIAGNOSIS — R87610 Atypical squamous cells of undetermined significance on cytologic smear of cervix (ASC-US): Secondary | ICD-10-CM | POA: Insufficient documentation

## 2019-06-10 DIAGNOSIS — N870 Mild cervical dysplasia: Secondary | ICD-10-CM

## 2019-06-10 DIAGNOSIS — Z3202 Encounter for pregnancy test, result negative: Secondary | ICD-10-CM

## 2019-06-10 DIAGNOSIS — R87810 Cervical high risk human papillomavirus (HPV) DNA test positive: Secondary | ICD-10-CM | POA: Diagnosis present

## 2019-06-10 LAB — POCT PREGNANCY, URINE: Preg Test, Ur: NEGATIVE

## 2019-06-10 NOTE — Progress Notes (Signed)
Patient given informed consent, signed copy in the chart, time out was performed.  Placed in lithotomy position. Cervix viewed with speculum and colposcope after application of acetic acid.   Colposcopy adequate?  yes Acetowhite lesions? no Punctation? no Mosaicism?  no Abnormal vasculature?  no Biopsies? no ECC? yes  COMMENTS:  Patient was given post procedure instructions.  She will return in 2 weeks for results.  Mora Bellman, MD

## 2019-06-11 LAB — SURGICAL PATHOLOGY

## 2019-06-11 NOTE — Progress Notes (Signed)
[id number] [place], [place] [id number] [place] [phone] Phone: [phone] Fax: [phone]      Your procedure is scheduled on Tuesday, June 16, 2019.  Report to [facility] Main Entrance "A" at 5:30 A.M., and check in at the Admitting office.  Call this number if you have problems the morning of surgery:  [phone]  Call [phone] if you have any questions prior to your surgery date Monday-Friday 8am-4pm    Remember:  Do not eat after midnight the night before your surgery  You may drink clear liquids until 4:30 AM the morning of your surgery.   Clear liquids allowed are: Water, Non-Citrus Juices (without pulp), Carbonated Beverages, Clear Tea, Black Coffee Only, and Gatorade    Take these medicines the morning of surgery with A SIP OF WATER:  megestrol (MEGACE)   7 days prior to surgery STOP taking any Aspirin (unless otherwise instructed by your surgeon), Aleve, Naproxen, Ibuprofen, Motrin, Advil, Goody's, BC's, all herbal medications, fish oil, and all vitamins.    The Morning of Surgery  Do not wear jewelry, make-up or nail polish.  Do not wear lotions, powders, perfumes, or deodorant  Do not shave 48 hours prior to surgery.  Do not bring valuables to the hospital.  [facility] is not responsible for any belongings or valuables.  If you are a smoker, DO NOT Smoke 24 hours prior to surgery  If you wear a CPAP at night please bring your mask the morning of surgery   Remember that you must have someone to transport you home after your surgery, and remain with you for 24 hours if you are discharged the same day.   Please bring cases for contacts, glasses, hearing aids, dentures or bridgework because it cannot be worn into surgery.    Leave your suitcase in the car.  After surgery it may be brought to your room.  For patients admitted to the hospital, discharge time will  be determined by your treatment team.  Patients discharged the day of surgery will not be allowed to drive home.    Special instructions:   [place]- Preparing For Surgery  Before surgery, you can play an important role. Because skin is not sterile, your skin needs to be as free of germs as possible. You can reduce the number of germs on your skin by washing with CHG (chlorahexidine gluconate) Soap before surgery.  CHG is an antiseptic cleaner which kills germs and bonds with the skin to continue killing germs even after washing.    Oral Hygiene is also important to reduce your risk of infection.  Remember - BRUSH YOUR TEETH THE MORNING OF SURGERY WITH YOUR REGULAR TOOTHPASTE  Please do not use if you have an allergy to CHG or antibacterial soaps. If your skin becomes reddened/irritated stop using the CHG.  Do not shave (including legs and underarms) for at least 48 hours prior to first CHG shower. It is OK to shave your face.  Please follow these instructions carefully.   1. Shower the NIGHT BEFORE SURGERY and the MORNING OF SURGERY with CHG Soap.   2. If you chose to wash your hair, wash your hair first as usual with your normal shampoo.  3. After you shampoo, rinse your hair and body thoroughly to remove the shampoo.  4. Use CHG as you would any other liquid soap. You can apply CHG directly to the skin  and wash gently with a scrungie or a clean washcloth.   5. Apply the CHG Soap to your body ONLY FROM THE NECK DOWN.  Do not use on open wounds or open sores. Avoid contact with your eyes, ears, mouth and genitals (private parts). Wash Face and genitals (private parts)  with your normal soap.   6. Wash thoroughly, paying special attention to the area where your surgery will be performed.  7. Thoroughly rinse your body with warm water from the neck down.  8. DO NOT shower/wash with your normal soap after using and rinsing off the CHG Soap.  9. Pat yourself dry with a CLEAN  TOWEL.  10. Wear CLEAN PAJAMAS to bed the night before surgery, wear comfortable clothes the morning of surgery  11. Place CLEAN SHEETS on your bed the night of your first shower and DO NOT SLEEP WITH PETS.    Day of Surgery:  Please shower the morning of surgery with the CHG soap Do not apply any deodorants/lotions. Please wear clean clothes to the hospital/surgery center.   Remember to brush your teeth WITH YOUR REGULAR TOOTHPASTE.   Please read over the following fact sheets that you were given.

## 2019-06-12 ENCOUNTER — Encounter (HOSPITAL_COMMUNITY): Payer: Self-pay

## 2019-06-12 ENCOUNTER — Other Ambulatory Visit (HOSPITAL_COMMUNITY)
Admission: RE | Admit: 2019-06-12 | Discharge: 2019-06-12 | Disposition: A | Discharge status: Home / Self Care | Payer: 59 | Source: Ambulatory Visit | Attending: Obstetrics and Gynecology | Admitting: Obstetrics and Gynecology

## 2019-06-12 ENCOUNTER — Encounter (HOSPITAL_COMMUNITY)
Admission: RE | Admit: 2019-06-12 | Discharge: 2019-06-12 | Disposition: A | Discharge status: Home / Self Care | Payer: 59 | Source: Ambulatory Visit | Attending: Obstetrics and Gynecology | Admitting: Obstetrics and Gynecology

## 2019-06-12 ENCOUNTER — Other Ambulatory Visit: Payer: Self-pay

## 2019-06-12 DIAGNOSIS — Z20822 Contact with and (suspected) exposure to covid-19: Secondary | ICD-10-CM | POA: Diagnosis not present

## 2019-06-12 DIAGNOSIS — Z01812 Encounter for preprocedural laboratory examination: Secondary | ICD-10-CM | POA: Insufficient documentation

## 2019-06-12 LAB — BASIC METABOLIC PANEL
Anion gap: 5 (ref 5–15)
BUN: 7 mg/dL (ref 6–20)
CO2: 24 mmol/L (ref 22–32)
Calcium: 9.1 mg/dL (ref 8.9–10.3)
Chloride: 109 mmol/L (ref 98–111)
Creatinine, Ser: 0.78 mg/dL (ref 0.44–1.00)
GFR calc Af Amer: >60 mL/min (ref >60)
GFR calc non Af Amer: >60 mL/min (ref >60)
Glucose, Bld: 97 mg/dL (ref 70–99)
Potassium: 3.8 mmol/L (ref 3.5–5.1)
Sodium: 138 mmol/L (ref 135–145)

## 2019-06-12 LAB — CBC
HCT: 35.8 % — ABNORMAL LOW (ref 36.0–46.0)
Hemoglobin: 9.7 g/dL — ABNORMAL LOW (ref 12.0–15.0)
MCH: 19.6 pg — ABNORMAL LOW (ref 26.0–34.0)
MCHC: 27.1 g/dL — ABNORMAL LOW (ref 30.0–36.0)
MCV: 72.5 fL — ABNORMAL LOW (ref 80.0–100.0)
Platelets: 385 10*3/uL (ref 150–400)
RBC: 4.94 MIL/uL (ref 3.87–5.11)
RDW: 24.7 % — ABNORMAL HIGH (ref 11.5–15.5)
WBC: 9 10*3/uL (ref 4.0–10.5)
nRBC: 0 % (ref 0.0–0.2)

## 2019-06-12 LAB — SARS CORONAVIRUS 2 (TAT 6-24 HRS): SARS Coronavirus 2: NEGATIVE

## 2019-06-12 NOTE — Progress Notes (Signed)
PCP - denies OB/GYN - Dr. Mora Bellman Cardiologist - denies   PPM/ICD - denies  Chest x-ray - N/A EKG - 05/12/2019 Stress Test - denies ECHO - 2013 Cardiac Cath - denies  Sleep Study - denies CPAP - N/A  Blood Thinner Instructions: N/A Aspirin Instructions: N/A  ERAS Protcol - Yes PRE-SURGERY Ensure or G2- None ordered  COVID TEST- Scheduled for today after PAT appointment. Patient verbalized understanding of self-quarantine instructions, appointment place and time.  Anesthesia review: No  Patient denies shortness of breath, fever, cough and chest pain at PAT appointment  All instructions explained to the patient, with a verbal understanding of the material. Patient agrees to go over the instructions while at home for a better understanding. Patient also instructed to self quarantine after being tested for COVID-19. The opportunity to ask questions was provided.

## 2019-06-15 NOTE — H&P (Signed)
Kimberly Jefferson is an 54 y.o. female P3 with DUB and fibroid uterus here for scheduled hysterectomy. Patient with history of anemia requiring a blood transfusion as a result of DUB. Patient reports doing well without complaints.   Pertinent Gynecological History: Menses: irregular, lasting 7-10 days Bleeding: dysfunctional uterine bleeding Contraception: tubal ligation DES exposure: denies Blood transfusions: 04/2019 due to symptomatic anemia Last mammogram: normal Date: 05/2019 Last pap: abnormal: ASCUS + HPV Date: 04/2019 followed by [facility] 1 on colpo 05/2019 OB History: P3   Menstrual History: No LMP recorded. Patient is perimenopausal.    Past Medical History:  Diagnosis Date  . Anxiety    no meds  . Dysmenorrhea   . Fibroids   . Heart murmur    "nothing to worry about" dx child, no problems ever  . History of blood transfusion  2013   heavy periods, MC 2 units transfuesd  . HTN (hypertension) 03/12/2012   one time no med  . Hypertension    one time  . Pneumonia     hx "had it once a year for a few years"  last time was 3- 4 years ago.  . SVD (spontaneous vaginal delivery)    x 2    Past Surgical History:  Procedure Laterality Date  . [place]   x 1  . CHOLECYSTECTOMY  1990  . ORIF ANKLE FRACTURE Right 06/18/2012   Procedure: OPEN REDUCTION INTERNAL FIXATION (ORIF) ANKLE FRACTURE;  Surgeon: Augustin Schooling, MD;  Location: [facility];  Service: Orthopedics;  Laterality: Right;  . TUBAL LIGATION  1990    Family History  Problem Relation Age of Onset  . Healthy Mother   . Healthy Father   . Breast cancer Maternal Aunt     Social History:  reports that she has been smoking cigarettes. She has a 10.00 pack-year smoking history. She has never used smokeless tobacco. She reports that she does not drink alcohol or use drugs.  Allergies:  Allergies  Allergen Reactions  . Bee Venom Anaphylaxis  . Doxycycline Nausea Only    Medications Prior to Admission   Medication Sig Dispense Refill Last Dose  . megestrol (MEGACE) 40 MG tablet Take 1 tablet (40 mg total) by mouth 2 (two) times daily. 60 tablet 1 06/16/2019 at 0230  . ibuprofen (ADVIL) 600 MG tablet Take 1 tablet (600 mg total) by mouth every 6 (six) hours as needed. (Patient not taking: Reported on 06/08/2019) 60 tablet 3 Not Taking at Unknown time    Review of Systems See pertinent in HPI Blood pressure (!) 154/82, pulse (!) 59, temperature 98.3 F (36.8 C), temperature source Oral, resp. rate 20, height 5\' 6"  (1.676 m), weight 81.6 kg, SpO2 97 %. Physical Exam GENERAL: Well-developed, well-nourished female in no acute distress.  LUNGS: Clear to auscultation bilaterally.  HEART: Regular rate and rhythm. ABDOMEN: Soft, nontender, nondistended. No organomegaly. PELVIC: Deferred to OR EXTREMITIES: No cyanosis, clubbing, or edema, 2+ distal pulses.  Results for orders placed or performed during the hospital encounter of 06/16/19 (from the past 24 hour(s))  Type and screen [place]     Status: None   Collection Time: 06/16/19  5:58 AM  Result Value Ref Range   ABO/RH(D) O POS    Antibody Screen POS    Sample Expiration      06/19/2019,2359 Performed at [facility], [place] [address], [place], [place] [address]   Pregnancy, urine POC  Status: None   Collection Time: 06/16/19  6:02 AM  Result Value Ref Range   Preg Test, Ur NEGATIVE NEGATIVE    No results found. CT ABDOMEN PELVIS W CONTRAST  Result Date: 05/12/2019 CLINICAL DATA:  Lower quadrant abdominal pain. Known uterine fibroids. EXAM: CT ABDOMEN AND PELVIS WITH CONTRAST TECHNIQUE: Multidetector CT imaging of the abdomen and pelvis was performed using the standard protocol following bolus administration of intravenous contrast. CONTRAST:  176mL OMNIPAQUE IOHEXOL 300 MG/ML  SOLN COMPARISON:  Pelvic ultrasound 05/06/2019. CT of the abdomen pelvis without contrast 03/11/2013. FINDINGS: Lower chest: The  lung bases are clear without focal nodule, mass, or airspace disease. The heart size is. No significant pleural or pericardial effusion is present. Hepatobiliary: No focal liver abnormality is seen. Status post cholecystectomy. No biliary dilatation. Pancreas: Unremarkable. No pancreatic ductal dilatation or surrounding inflammatory changes. Spleen: Normal in size without focal abnormality. Adrenals/Urinary Tract: Adrenal glands are normal bilaterally. Kidneys and ureters are within normal limits. No stone or mass lesion is present. The urinary bladder is within normal limits. Stomach/Bowel: Stomach and duodenum are within normal limits. Small bowel is unremarkable. Terminal ileum is normal. Appendix is visualized and within normal limits. The ascending and transverse colon are normal. The descending and sigmoid colon are normal. Vascular/Lymphatic: Atherosclerotic calcifications are present in the aorta without aneurysm. No significant adenopathy is present. Reproductive: Complex uterine fibroid is again noted the fundus of the uterus. The fibroid measures at least 10.5 x 10.6 x 10.0 cm. There is minimal fluid in the lower uterine segment. Fluid is present within the vagina. No obstructing distal lesion is evident. Adnexa are unremarkable. Other: No abdominal wall hernia or abnormality. No abdominopelvic ascites. Musculoskeletal: Degenerative changes are present at L5-S1 with moderate facet hypertrophy and a vacuum disc. Mild disc bulging is also present at L4-5. Vertebral body heights and alignment are maintained. Bony pelvis is normal. Hips are located and within normal limits. IMPRESSION: 1. Complex uterine fibroid measures at least 10.5 cm. 2. Fluid within the vagina. No obstructing lesion is evident. This likely represents blood in the vagina given the patient's presentation of vaginal bleeding. 3. No other acute or focal lesion to explain lower abdominal pain. 4. Cholecystectomy. 5. Degenerative changes of  the lower lumbar spine are most evident at L5-S1. Electronically Signed   By: San Morelle M.D.   On: 05/12/2019 04:55   Mammogram Screening Routine  Result Date: 06/09/2019 CLINICAL DATA:  Screening. EXAM: DIGITAL SCREENING BILATERAL MAMMOGRAM WITH CAD COMPARISON:  None. ACR Breast Density Category c: The breast tissue is heterogeneously dense, which may obscure small masses FINDINGS: There are no findings suspicious for malignancy. Images were processed with CAD. IMPRESSION: No mammographic evidence of malignancy. A result letter of this screening mammogram will be mailed directly to the patient. RECOMMENDATION: Screening mammogram in one year. (Code:SM-B-01Y) BI-RADS CATEGORY  1: Negative. Electronically Signed   By: Kristopher Oppenheim M.D.   On: 06/09/2019 16:12   US PELVIC COMPLETE WITH TRANSVAGINAL  Result Date: 05/06/2019 CLINICAL DATA:  Fibroid uterus, uterine leiomyomata, heavy menses, prior Caesarean section and tubal ligation EXAM: TRANSABDOMINAL AND TRANSVAGINAL ULTRASOUND OF PELVIS TECHNIQUE: Both transabdominal and transvaginal ultrasound examinations of the pelvis were performed. Transabdominal technique was performed for global imaging of the pelvis including uterus, ovaries, adnexal regions, and pelvic cul-de-sac. It was necessary to proceed with endovaginal exam following the transabdominal exam to visualize the endometrium. COMPARISON:  12/17/2017 FINDINGS: Uterus Measurements: 12.0 x 8.1 x 10.5 cm = volume:  532 mL. Diffusely heterogeneous and somewhat nodular appearing enlarged uterus, especially at fundus. No discrete leiomyomata are identified. Endometrium Obscured due to presumed numerous uterine leiomyomata. Endocervical canal normal appearance. Right ovary Measurements: 2.9 x 1.7 x 1.5 cm = volume: 3.8 mL. Normal morphology without mass Left ovary Measurements: 3.1 x 2.1 x 2.6 cm = volume: 8.7 mL. Normal morphology without mass Other findings No free pelvic fluid or adnexal  masses. IMPRESSION: Normal appearing ovaries. Nonvisualization of endometrial complex. Diffusely enlarged and heterogeneous uterus, somewhat nodular in appearance though a discrete mass is not seen; this appearance can be seen with multiple uterine leiomyomata and with diffuse uterine adenomyosis. Electronically Signed   By: Lavonia Dana M.D.   On: 05/06/2019 10:54    Assessment/Plan: 54 yo with fibroid uterus and DUB here for scheduled hysterectomy - Risks, benefits and alternatives were explained including but not limited to risks of bleeding, infection and damage to adjacent organs. Patient verbalized understanding and all questions were answered. Patient will undergo an abdominal hysterectomy with bilateral salpingectomy  Marlan Steward 06/16/2019, 7:17 AM

## 2019-06-15 NOTE — Anesthesia Preprocedure Evaluation (Addendum)
Anesthesia Evaluation  Patient identified by MRN, date of birth, ID band Patient awake    Reviewed: Allergy & Precautions, NPO status , Patient's Chart, lab work & pertinent test results  Airway Mallampati: II  TM Distance: >3 FB Neck ROM: Full    Dental  (+) Dental Advisory Given   Pulmonary Current Smoker,    breath sounds clear to auscultation       Cardiovascular hypertension,  Rhythm:Regular Rate:Normal     Neuro/Psych negative neurological ROS     GI/Hepatic negative GI ROS, Neg liver ROS,   Endo/Other  negative endocrine ROS  Renal/GU negative Renal ROS     Musculoskeletal   Abdominal   Peds  Hematology  (+) anemia ,   Anesthesia Other Findings   Reproductive/Obstetrics                            Lab Results  Component Value Date   WBC 9.0 06/12/2019   HGB 9.7 (L) 06/12/2019   HCT 35.8 (L) 06/12/2019   MCV 72.5 (L) 06/12/2019   PLT 385 06/12/2019   Lab Results  Component Value Date   CREATININE 0.78 06/12/2019   BUN 7 06/12/2019   NA 138 06/12/2019   K 3.8 06/12/2019   CL 109 06/12/2019   CO2 24 06/12/2019    Anesthesia Physical Anesthesia Plan  ASA: II  Anesthesia Plan: General   Post-op Pain Management:  Regional for Post-op pain   Induction: Intravenous  PONV Risk Score and Plan: 3 and Scopolamine patch - Pre-op, Dexamethasone, Ondansetron and Treatment may vary due to age or medical condition  Airway Management Planned: Oral ETT  Additional Equipment: None  Intra-op Plan:   Post-operative Plan: Extubation in OR  Informed Consent: I have reviewed the patients History and Physical, chart, labs and discussed the procedure including the risks, benefits and alternatives for the proposed anesthesia with the patient or authorized representative who has indicated his/her understanding and acceptance.     Dental advisory given  Plan Discussed with:  CRNA  Anesthesia Plan Comments: (2IV's. TAP blocks b/l. T&S. GETA.)      Anesthesia Quick Evaluation

## 2019-06-16 ENCOUNTER — Inpatient Hospital Stay (HOSPITAL_COMMUNITY): Payer: 59 | Admitting: Anesthesiology

## 2019-06-16 ENCOUNTER — Encounter (HOSPITAL_COMMUNITY): Payer: Self-pay | Admitting: Obstetrics and Gynecology

## 2019-06-16 ENCOUNTER — Encounter (HOSPITAL_COMMUNITY)
Admission: RE | Disposition: A | Discharge status: Home / Self Care | Payer: Self-pay | Source: Home / Self Care | Attending: Obstetrics and Gynecology

## 2019-06-16 ENCOUNTER — Other Ambulatory Visit: Payer: Self-pay

## 2019-06-16 ENCOUNTER — Inpatient Hospital Stay (HOSPITAL_COMMUNITY)
Admission: RE | Admit: 2019-06-16 | Discharge: 2019-06-18 | DRG: 742 | Disposition: A | Discharge status: Home / Self Care | Payer: 59 | Attending: Obstetrics and Gynecology | Admitting: Obstetrics and Gynecology

## 2019-06-16 DIAGNOSIS — Z20822 Contact with and (suspected) exposure to covid-19: Secondary | ICD-10-CM | POA: Diagnosis present

## 2019-06-16 DIAGNOSIS — D259 Leiomyoma of uterus, unspecified: Secondary | ICD-10-CM | POA: Diagnosis present

## 2019-06-16 DIAGNOSIS — F17210 Nicotine dependence, cigarettes, uncomplicated: Secondary | ICD-10-CM | POA: Diagnosis present

## 2019-06-16 DIAGNOSIS — D62 Acute posthemorrhagic anemia: Secondary | ICD-10-CM | POA: Diagnosis present

## 2019-06-16 DIAGNOSIS — Z9049 Acquired absence of other specified parts of digestive tract: Secondary | ICD-10-CM | POA: Diagnosis not present

## 2019-06-16 DIAGNOSIS — Z90710 Acquired absence of both cervix and uterus: Secondary | ICD-10-CM | POA: Diagnosis present

## 2019-06-16 DIAGNOSIS — I10 Essential (primary) hypertension: Secondary | ICD-10-CM | POA: Diagnosis present

## 2019-06-16 DIAGNOSIS — N920 Excessive and frequent menstruation with regular cycle: Secondary | ICD-10-CM | POA: Diagnosis present

## 2019-06-16 DIAGNOSIS — N938 Other specified abnormal uterine and vaginal bleeding: Secondary | ICD-10-CM | POA: Diagnosis present

## 2019-06-16 DIAGNOSIS — N870 Mild cervical dysplasia: Secondary | ICD-10-CM

## 2019-06-16 HISTORY — PX: HYSTERECTOMY ABDOMINAL WITH SALPINGECTOMY: SHX6725

## 2019-06-16 HISTORY — PX: ABDOMINAL HYSTERECTOMY: SHX81

## 2019-06-16 LAB — CBC
HCT: 35.3 % — ABNORMAL LOW (ref 36.0–46.0)
Hemoglobin: 10.8 g/dL — ABNORMAL LOW (ref 12.0–15.0)
MCH: 22.6 pg — ABNORMAL LOW (ref 26.0–34.0)
MCHC: 30.6 g/dL (ref 30.0–36.0)
MCV: 74 fL — ABNORMAL LOW (ref 80.0–100.0)
Platelets: 289 10*3/uL (ref 150–400)
RBC: 4.77 MIL/uL (ref 3.87–5.11)
RDW: 24.1 % — ABNORMAL HIGH (ref 11.5–15.5)
WBC: 19.3 10*3/uL — ABNORMAL HIGH (ref 4.0–10.5)
nRBC: 0 % (ref 0.0–0.2)

## 2019-06-16 LAB — CREATININE, SERUM
Creatinine, Ser: 0.87 mg/dL (ref 0.44–1.00)
GFR calc Af Amer: >60 mL/min (ref >60)
GFR calc non Af Amer: >60 mL/min (ref >60)

## 2019-06-16 LAB — POCT PREGNANCY, URINE: Preg Test, Ur: NEGATIVE

## 2019-06-16 SURGERY — HYSTERECTOMY, TOTAL, ABDOMINAL, WITH SALPINGECTOMY
Anesthesia: General | Site: Abdomen | Laterality: Bilateral

## 2019-06-16 MED ORDER — PROPOFOL 10 MG/ML IV BOLUS
INTRAVENOUS | Status: DC | PRN
  Administered 2019-06-16: 150 mg via INTRAVENOUS

## 2019-06-16 MED ORDER — BUPIVACAINE-EPINEPHRINE (PF) 0.25% -1:200000 IJ SOLN
INTRAMUSCULAR | Status: DC | PRN
  Administered 2019-06-16 (×2): 25 mL

## 2019-06-16 MED ORDER — CEFAZOLIN SODIUM-DEXTROSE 2-4 GM/100ML-% IV SOLN
2.0000 g | INTRAVENOUS | Status: AC
  Administered 2019-06-16: 2 g via INTRAVENOUS
  Filled 2019-06-16: qty 100

## 2019-06-16 MED ORDER — MIDAZOLAM HCL 2 MG/2ML IJ SOLN
INTRAMUSCULAR | Status: AC
  Filled 2019-06-16: qty 2

## 2019-06-16 MED ORDER — FENTANYL CITRATE (PF) 250 MCG/5ML IJ SOLN
INTRAMUSCULAR | Status: AC
  Filled 2019-06-16: qty 5

## 2019-06-16 MED ORDER — FERROUS SULFATE 325 (65 FE) MG PO TABS
325.0000 mg | ORAL_TABLET | Freq: Three times a day (TID) | ORAL | Status: DC
  Administered 2019-06-16 – 2019-06-18 (×5): 325 mg via ORAL
  Filled 2019-06-16 (×5): qty 1

## 2019-06-16 MED ORDER — ROCURONIUM BROMIDE 10 MG/ML (PF) SYRINGE
PREFILLED_SYRINGE | INTRAVENOUS | Status: DC | PRN
  Administered 2019-06-16: 50 mg via INTRAVENOUS
  Administered 2019-06-16: 10 mg via INTRAVENOUS
  Administered 2019-06-16: 20 mg via INTRAVENOUS

## 2019-06-16 MED ORDER — DIPHENHYDRAMINE HCL 12.5 MG/5ML PO ELIX: 12.5000 mg | ORAL_SOLUTION | Freq: Four times a day (QID) | ORAL | Status: DC | PRN

## 2019-06-16 MED ORDER — SODIUM CHLORIDE 0.9% FLUSH: 9.0000 mL | INTRAVENOUS | Status: DC | PRN

## 2019-06-16 MED ORDER — ONDANSETRON HCL 4 MG/2ML IJ SOLN: 4.0000 mg | Freq: Four times a day (QID) | INTRAMUSCULAR | Status: DC | PRN

## 2019-06-16 MED ORDER — ONDANSETRON HCL 4 MG/2ML IJ SOLN: 4.0000 mg | Freq: Once | INTRAMUSCULAR | Status: DC | PRN

## 2019-06-16 MED ORDER — KETAMINE HCL 10 MG/ML IJ SOLN
INTRAMUSCULAR | Status: DC | PRN
  Administered 2019-06-16: 10 mg via INTRAVENOUS
  Administered 2019-06-16: 40 mg via INTRAVENOUS

## 2019-06-16 MED ORDER — ENOXAPARIN SODIUM 40 MG/0.4ML ~~LOC~~ SOLN
40.0000 mg | SUBCUTANEOUS | Status: DC
  Administered 2019-06-17 – 2019-06-18 (×2): 40 mg via SUBCUTANEOUS
  Filled 2019-06-16 (×2): qty 0.4

## 2019-06-16 MED ORDER — LACTATED RINGERS IV SOLN: INTRAVENOUS | Status: DC | PRN

## 2019-06-16 MED ORDER — ALBUMIN HUMAN 5 % IV SOLN: INTRAVENOUS | Status: DC | PRN

## 2019-06-16 MED ORDER — MIDAZOLAM HCL 2 MG/2ML IJ SOLN
INTRAMUSCULAR | Status: DC | PRN
  Administered 2019-06-16: 2 mg via INTRAVENOUS

## 2019-06-16 MED ORDER — HYDROMORPHONE 1 MG/ML IV SOLN
INTRAVENOUS | Status: AC
  Filled 2019-06-16: qty 30

## 2019-06-16 MED ORDER — PHENYLEPHRINE HCL-NACL 10-0.9 MG/250ML-% IV SOLN
INTRAVENOUS | Status: DC | PRN
  Administered 2019-06-16: 15 ug/min via INTRAVENOUS

## 2019-06-16 MED ORDER — SUGAMMADEX SODIUM 200 MG/2ML IV SOLN
INTRAVENOUS | Status: DC | PRN
  Administered 2019-06-16: 200 mg via INTRAVENOUS

## 2019-06-16 MED ORDER — LACTATED RINGERS IV SOLN: INTRAVENOUS | Status: DC

## 2019-06-16 MED ORDER — FENTANYL CITRATE (PF) 100 MCG/2ML IJ SOLN: 25.0000 ug | INTRAMUSCULAR | Status: DC | PRN

## 2019-06-16 MED ORDER — HYDROMORPHONE 1 MG/ML IV SOLN
INTRAVENOUS | Status: DC
  Administered 2019-06-16: 2.9 mg via INTRAVENOUS
  Administered 2019-06-16: 2.6 mg via INTRAVENOUS
  Administered 2019-06-16: 0.3 mg via INTRAVENOUS
  Administered 2019-06-16: 0.6 mg via INTRAVENOUS
  Administered 2019-06-16: 30 mg via INTRAVENOUS

## 2019-06-16 MED ORDER — ACETAMINOPHEN 500 MG PO TABS
1000.0000 mg | ORAL_TABLET | Freq: Once | ORAL | Status: AC
  Administered 2019-06-16: 06:00:00 1000 mg via ORAL
  Filled 2019-06-16: qty 2

## 2019-06-16 MED ORDER — CELECOXIB 200 MG PO CAPS
200.0000 mg | ORAL_CAPSULE | Freq: Once | ORAL | Status: AC
  Administered 2019-06-16: 06:00:00 200 mg via ORAL
  Filled 2019-06-16: qty 1

## 2019-06-16 MED ORDER — PROPOFOL 10 MG/ML IV BOLUS
INTRAVENOUS | Status: AC
  Filled 2019-06-16: qty 20

## 2019-06-16 MED ORDER — BUPIVACAINE HCL (PF) 0.25 % IJ SOLN
INTRAMUSCULAR | Status: AC
  Filled 2019-06-16: qty 30

## 2019-06-16 MED ORDER — ONDANSETRON HCL 4 MG PO TABS: 4.0000 mg | ORAL_TABLET | Freq: Four times a day (QID) | ORAL | Status: DC | PRN

## 2019-06-16 MED ORDER — DOCUSATE SODIUM 100 MG PO CAPS
100.0000 mg | ORAL_CAPSULE | Freq: Two times a day (BID) | ORAL | Status: DC
  Administered 2019-06-16 – 2019-06-18 (×4): 100 mg via ORAL
  Filled 2019-06-16 (×4): qty 1

## 2019-06-16 MED ORDER — CLONIDINE HCL (ANALGESIA) 100 MCG/ML EP SOLN
EPIDURAL | Status: DC | PRN
  Administered 2019-06-16 (×2): 50 ug

## 2019-06-16 MED ORDER — KETAMINE HCL 50 MG/5ML IJ SOSY
PREFILLED_SYRINGE | INTRAMUSCULAR | Status: AC
  Filled 2019-06-16: qty 5

## 2019-06-16 MED ORDER — FENTANYL CITRATE (PF) 250 MCG/5ML IJ SOLN
INTRAMUSCULAR | Status: DC | PRN
  Administered 2019-06-16 (×5): 50 ug via INTRAVENOUS

## 2019-06-16 MED ORDER — DIPHENHYDRAMINE HCL 50 MG/ML IJ SOLN: 12.5000 mg | Freq: Four times a day (QID) | INTRAMUSCULAR | Status: DC | PRN

## 2019-06-16 MED ORDER — LIDOCAINE 2% (20 MG/ML) 5 ML SYRINGE
INTRAMUSCULAR | Status: DC | PRN
  Administered 2019-06-16: 20 mg via INTRAVENOUS

## 2019-06-16 MED ORDER — DEXAMETHASONE SODIUM PHOSPHATE 10 MG/ML IJ SOLN
INTRAMUSCULAR | Status: DC | PRN
  Administered 2019-06-16: 10 mg via INTRAVENOUS

## 2019-06-16 MED ORDER — NALOXONE HCL 0.4 MG/ML IJ SOLN: 0.4000 mg | INTRAMUSCULAR | Status: DC | PRN

## 2019-06-16 MED ORDER — CHLORHEXIDINE GLUCONATE CLOTH 2 % EX PADS
6.0000 | MEDICATED_PAD | Freq: Every day | CUTANEOUS | Status: DC
  Administered 2019-06-16 – 2019-06-18 (×3): 6 via TOPICAL

## 2019-06-16 MED ORDER — BUPIVACAINE HCL (PF) 0.25 % IJ SOLN
INTRAMUSCULAR | Status: DC | PRN
  Administered 2019-06-16: 30 mL

## 2019-06-16 MED ORDER — SCOPOLAMINE 1 MG/3DAYS TD PT72
MEDICATED_PATCH | TRANSDERMAL | Status: DC | PRN
  Administered 2019-06-16: 1 via TRANSDERMAL

## 2019-06-16 MED ORDER — PHENYLEPHRINE HCL (PRESSORS) 10 MG/ML IV SOLN
INTRAVENOUS | Status: DC | PRN
  Administered 2019-06-16 (×2): 80 ug via INTRAVENOUS

## 2019-06-16 MED ORDER — GABAPENTIN 100 MG PO CAPS
100.0000 mg | ORAL_CAPSULE | Freq: Three times a day (TID) | ORAL | Status: DC
  Administered 2019-06-16 – 2019-06-18 (×6): 100 mg via ORAL
  Filled 2019-06-16 (×6): qty 1

## 2019-06-16 MED ORDER — ONDANSETRON HCL 4 MG/2ML IJ SOLN
INTRAMUSCULAR | Status: DC | PRN
  Administered 2019-06-16: 4 mg via INTRAVENOUS

## 2019-06-16 SURGICAL SUPPLY — 36 items
BRR ADH 6X5 SEPRAFILM 1 SHT (MISCELLANEOUS)
CANISTER SUCT 3000ML PPV (MISCELLANEOUS) ×3 IMPLANT
COVER WAND RF STERILE (DRAPES) ×3 IMPLANT
DECANTER SPIKE VIAL GLASS SM (MISCELLANEOUS) IMPLANT
DRAPE CESAREAN BIRTH W POUCH (DRAPES) ×3 IMPLANT
DRAPE WARM FLUID 44X44 (DRAPES) ×2 IMPLANT
DRSG OPSITE POSTOP 4X10 (GAUZE/BANDAGES/DRESSINGS) ×3 IMPLANT
DURAPREP 26ML APPLICATOR (WOUND CARE) ×3 IMPLANT
GAUZE 4X4 16PLY RFD (DISPOSABLE) ×3 IMPLANT
GAUZE SPONGE 4X4 12PLY STRL LF (GAUZE/BANDAGES/DRESSINGS) ×3 IMPLANT
GLOVE BIOGEL PI IND STRL 6.5 (GLOVE) ×1 IMPLANT
GLOVE BIOGEL PI IND STRL 7.0 (GLOVE) ×2 IMPLANT
GLOVE BIOGEL PI INDICATOR 6.5 (GLOVE) ×2
GLOVE BIOGEL PI INDICATOR 7.0 (GLOVE) ×4
GLOVE SURG SS PI 6.0 STRL IVOR (GLOVE) ×3 IMPLANT
GOWN STRL REUS W/ TWL LRG LVL3 (GOWN DISPOSABLE) ×3 IMPLANT
GOWN STRL REUS W/TWL LRG LVL3 (GOWN DISPOSABLE) ×9
HIBICLENS CHG 4% 4OZ BTL (MISCELLANEOUS) ×3 IMPLANT
KIT TURNOVER KIT B (KITS) ×3 IMPLANT
NEEDLE HYPO 22GX1.5 SAFETY (NEEDLE) ×3 IMPLANT
NS IRRIG 1000ML POUR BTL (IV SOLUTION) ×3 IMPLANT
PACK ABDOMINAL GYN (CUSTOM PROCEDURE TRAY) ×3 IMPLANT
PAD ABD 8X7 1/2 STERILE (GAUZE/BANDAGES/DRESSINGS) ×3 IMPLANT
PAD ARMBOARD 7.5X6 YLW CONV (MISCELLANEOUS) ×3 IMPLANT
PAD OB MATERNITY 4.3X12.25 (PERSONAL CARE ITEMS) ×3 IMPLANT
SEPRAFILM MEMBRANE 5X6 (MISCELLANEOUS) IMPLANT
SPECIMEN JAR MEDIUM (MISCELLANEOUS) ×3 IMPLANT
SPONGE LAP 18X18 RF (DISPOSABLE) ×2 IMPLANT
SUT VIC AB 0 CT1 18XCR BRD8 (SUTURE) ×2 IMPLANT
SUT VIC AB 0 CT1 36 (SUTURE) ×6 IMPLANT
SUT VIC AB 0 CT1 8-18 (SUTURE) ×9
SUT VIC AB 4-0 KS 27 (SUTURE) ×3 IMPLANT
SUT VICRYL 0 TIES 12 18 (SUTURE) ×3 IMPLANT
SYR CONTROL 10ML LL (SYRINGE) ×3 IMPLANT
TOWEL GREEN STERILE FF (TOWEL DISPOSABLE) ×6 IMPLANT
TRAY FOLEY W/BAG SLVR 14FR (SET/KITS/TRAYS/PACK) ×3 IMPLANT

## 2019-06-16 NOTE — Transfer of Care (Signed)
Immediate Anesthesia Transfer of Care Note  Patient: Kimberly Jefferson  Procedure(s) Performed: HYSTERECTOMY ABDOMINAL WITH SALPINGECTOMY (Bilateral Abdomen)  Patient Location: PACU  Anesthesia Type:General  Level of Consciousness: awake, alert  and oriented  Airway & Oxygen Therapy: Patient Spontanous Breathing and Patient connected to nasal cannula oxygen  Post-op Assessment: Report given to RN and Post -op Vital signs reviewed and stable  Post vital signs: Reviewed and stable  Last Vitals:  Vitals Value Taken Time  BP 156/84 06/16/19 0940  Temp    Pulse 73 06/16/19 0941  Resp 19 06/16/19 0941  SpO2 100 % 06/16/19 0941  Vitals shown include unvalidated device data.  Last Pain:  Vitals:   06/16/19 0612  TempSrc: Oral  PainSc:          Complications: No apparent anesthesia complications

## 2019-06-16 NOTE — Op Note (Addendum)
Kimberly Jefferson PROCEDURE DATE: 06/16/2019  PREOPERATIVE DIAGNOSIS:  54 y.o. P3 with symptomatic fibroids, menorrhagia POSTOPERATIVE DIAGNOSIS:  Same SURGEON:   Mora Bellman, M.D. ASSISTANT:   Verita Schneiders, MD OPERATION:  Total abdominal hysterectomy, bilateral salpingectomy and cystoscopy ANESTHESIA:  General endotracheal.  INDICATIONS: The patient is a 54 y.o. P3 with history of symptomatic uterine fibroids/menorrhagia. The patient made a decision to undergo definite surgical treatment. On the preoperative visit, the risks, benefits, indications, and alternatives of the procedure were reviewed with the patient.  On the day of surgery, the risks of surgery were again discussed with the patient including but not limited to: bleeding which may require transfusion or reoperation; infection which may require antibiotics; injury to bowel, bladder, ureters or other surrounding organs; need for additional procedures; thromboembolic phenomenon, incisional problems and other postoperative/anesthesia complications. Written informed consent was obtained.    OPERATIVE FINDINGS: A 16 week size uterus with normal tubes and ovaries bilaterally.  ESTIMATED BLOOD LOSS: 500 ml FLUIDS:  1200 ml of Lactated Ringers, 250 ml albumin, 2 units pRBC URINE OUTPUT:  400 ml of clear yellow urine. SPECIMENS:  Uterus and bilateral fallopian tubes (weight 715 gm) sent to pathology COMPLICATIONS:  None immediate.   DESCRIPTION OF PROCEDURE: The patient received intravenous antibiotics and had sequential compression devices applied to her lower extremities while in the preoperative area.   She was taken to the operating room where anesthesia was induced and found to be adequate. The patient was placed in supine position. The abdomen and perineum were prepped and draped in a sterile manner, and a Foley catheter was inserted into the bladder and attached to Kimberly Jefferson drainage. After an adequate timeout was performed, a  Pfannensteil skin incision was made. This incision was taken down to the fascia using electrocautery with care given to maintain good hemostasis. The fascia was incised in the midline and the fascial incision was then extended bilaterally using mayo scissors. The fascia was then dissected off the underlying rectus muscles using blunt and sharp dissection. The rectus muscles were split bluntly in the midline and the peritoneum entered sharply without complication. This peritoneal incision was then extended superiorly and inferiorly with care given to prevent bowel or bladder injury. Upon entry into the abdominal cavity, the upper abdomen was inspected and found to be normal. Attention was then turned to the pelvis. A retractor was placed into the incision, and the bowel was packed away with moist laparotomy sponges. The uterus at this point was noted to be mobilized. The round ligaments on each side were clamped, suture ligated, and transected with electrocautery allowing entry into the broad ligament. The anterior and posterior leaves of the broad ligament were separated and the ureters were inspected to be safely away from the area of dissection bilaterally. A hole was created in the clear portion of the posterior broad ligament. Adnexae were clamped on the patient's right side. This pedicle was then clamped, cut, and doubly suture ligated with good hemostasis. This procedure was repeated in an identical fashion on the left site allowing for both adnexa to remain in place.  A bladder flap was then created across the anterior leaf of the broad ligament and the bladder was then bluntly dissected off the lower uterine segment and cervix with good hemostasis. The uterine arteries were then skeletonized bilaterally and then clamped, cut, and ligated with care given to prevent ureteral injury. The uterosacral ligaments were then clamped, cut, and ligated bilaterally. Finally, the cardinal ligaments were clamped,  cut, and  ligated bilaterally.  Acutely curved clamps were placed across the vagina just under the cervix, and the specimen was amputated and sent to pathology. The vaginal cuff was closed with a series of interrupted 0 Vicryl figure-of-eight sutures with care given to incorporate the anterior pubocervical fascia and the posterior rectovaginal fascia.  The vaginal cuff angles were noted to have good hemostasis. Babcock clamp was used to grasp the fallopian tubes which were then gasped with a Kelly clamp, transected and suture ligated, allowing for bilateral salpingectomy.  The pelvis was irrigated and hemostasis was reconfirmed at all pedicles and along the pelvic sidewall.  All laparotomy sponges and instruments were removed from the abdomen. The fascia was closed with 0 Vicryl in a running fashion. The skin was closed with a 4-0 vicryl subcuticular stitch. Sponge, lap, needle, and instrument counts were correct times two. Attention was then turned below for the cystoscopy. The foley catheter was removed. The 70 degree cystoscope was inserted into the bladder. Bilateral ureteral jets were visualized. The foley catheter was re-inserted into the bladder. The patient was taken to the recovery area awake, extubated and in stable condition.  An experienced assistant was required given the standard of surgical care given the complexity of the case.  This assistant was needed for exposure, dissection, suctioning, retraction, instrument exchange, and for overall help during the procedure.

## 2019-06-16 NOTE — Anesthesia Procedure Notes (Signed)
Procedure Name: Intubation Date/Time: 06/16/2019 7:38 AM Performed by: Clearnce Sorrel, CRNA Pre-anesthesia Checklist: Patient identified, Emergency Drugs available, Suction available, Patient being monitored and Timeout performed Patient Re-evaluated:Patient Re-evaluated prior to induction Oxygen Delivery Method: Circle system utilized Preoxygenation: Pre-oxygenation with 100% oxygen Induction Type: IV induction Ventilation: Mask ventilation without difficulty Laryngoscope Size: Mac and 3 Grade View: Grade II Tube type: Oral Tube size: 7.0 mm Number of attempts: 1 Airway Equipment and Method: Stylet Placement Confirmation: ETT inserted through vocal cords under direct vision,  positive ETCO2 and breath sounds checked- equal and bilateral Secured at: 23 cm Tube secured with: Tape Dental Injury: Teeth and Oropharynx as per pre-operative assessment

## 2019-06-16 NOTE — Anesthesia Procedure Notes (Signed)
Anesthesia Regional Block: TAP block   Pre-Anesthetic Checklist: ,, timeout performed, Correct Patient, Correct Site, Correct Laterality, Correct Procedure, Correct Position, site marked, Risks and benefits discussed,  Surgical consent,  Pre-op evaluation,  At surgeon's request and post-op pain management  Laterality: Left and Right  Prep: chloraprep       Needles:  Injection technique: Single-shot  Needle Type: Echogenic Needle     Needle Length: 9cm  Needle Gauge: 21     Additional Needles:   Procedures:,,,, ultrasound used (permanent image in chart),,,,  Narrative:  Start time: 06/16/2019 7:00 AM End time: 06/16/2019 7:12 AM Injection made incrementally with aspirations every 5 mL.  Performed by: Personally  Anesthesiologist: Suzette Battiest, MD

## 2019-06-16 NOTE — Anesthesia Postprocedure Evaluation (Signed)
Anesthesia Post Note  Patient: Kimberly Jefferson  Procedure(s) Performed: HYSTERECTOMY ABDOMINAL WITH SALPINGECTOMY (Bilateral Abdomen)     Patient location during evaluation: PACU Anesthesia Type: General Level of consciousness: awake and alert Pain management: pain level controlled Vital Signs Assessment: post-procedure vital signs reviewed and stable Respiratory status: spontaneous breathing, nonlabored ventilation, respiratory function stable and patient connected to nasal cannula oxygen Cardiovascular status: blood pressure returned to baseline and stable Postop Assessment: no apparent nausea or vomiting Anesthetic complications: no    Last Vitals:  Vitals:   06/16/19 1154 06/16/19 1156  BP:  125/73  Pulse:  64  Resp: (!) 22 16  Temp:  36.7 C  SpO2: 100% 100%    Last Pain:  Vitals:   06/16/19 1201  TempSrc:   PainSc: 10-Worst pain ever                 Tiajuana Amass

## 2019-06-17 LAB — CBC
HCT: 31.2 % — ABNORMAL LOW (ref 36.0–46.0)
Hemoglobin: 9.7 g/dL — ABNORMAL LOW (ref 12.0–15.0)
MCH: 23 pg — ABNORMAL LOW (ref 26.0–34.0)
MCHC: 31.1 g/dL (ref 30.0–36.0)
MCV: 74.1 fL — ABNORMAL LOW (ref 80.0–100.0)
Platelets: 275 10*3/uL (ref 150–400)
RBC: 4.21 MIL/uL (ref 3.87–5.11)
RDW: 23.9 % — ABNORMAL HIGH (ref 11.5–15.5)
WBC: 14.9 10*3/uL — ABNORMAL HIGH (ref 4.0–10.5)
nRBC: 0 % (ref 0.0–0.2)

## 2019-06-17 LAB — SURGICAL PATHOLOGY

## 2019-06-17 MED ORDER — OXYCODONE-ACETAMINOPHEN 5-325 MG PO TABS
1.0000 | ORAL_TABLET | ORAL | Status: DC | PRN
  Administered 2019-06-17 – 2019-06-18 (×2): 2 via ORAL
  Filled 2019-06-17 (×2): qty 2

## 2019-06-17 MED ORDER — IBUPROFEN 600 MG PO TABS
600.0000 mg | ORAL_TABLET | Freq: Four times a day (QID) | ORAL | Status: DC
  Administered 2019-06-17 – 2019-06-18 (×4): 600 mg via ORAL
  Filled 2019-06-17 (×4): qty 1

## 2019-06-17 NOTE — Progress Notes (Signed)
1 Day Post-Op Procedure(s) (LRB): HYSTERECTOMY ABDOMINAL WITH SALPINGECTOMY (Bilateral)  Subjective: Patient reports doing well with pain being well controlled. She ambulated 4 times without feeling dizzy or lightheaded. Patient is tolerating a regular diet and voiding. Patient is without any complaints    Objective: I have reviewed patient's vital signs, intake and output and labs. Blood pressure (!) 122/58, pulse (!) 54, temperature 98.8 F (37.1 C), temperature source Oral, resp. rate (!) 22, height 5\' 6"  (1.676 m), weight 86 kg, last menstrual period 05/10/2019, SpO2 98 %.  General: alert, cooperative and no distress Resp: clear to auscultation bilaterally Cardio: regular rate and rhythm GI: soft, non-tender; bowel sounds normal; no masses,  no organomegaly and incision: dry and intact Extremities: extremities normal, atraumatic, no cyanosis or edema and Homans sign is negative, no sign of DVT  CBC Latest Ref Rng & Units 06/17/2019 06/16/2019 06/12/2019  WBC 4.0 - 10.5 K/uL 14.9(H) 19.3(H) 9.0  Hemoglobin 12.0 - 15.0 g/dL 9.7(L) 10.8(L) 9.7(L)  Hematocrit 36.0 - 46.0 % 31.2(L) 35.3(L) 35.8(L)  Platelets 150 - 400 K/uL 275 289 385     Assessment: s/p Procedure(s) with comments: HYSTERECTOMY ABDOMINAL WITH SALPINGECTOMY (Bilateral) - tap block per MD: stable, progressing well and tolerating diet  Plan: Encourage ambulation Advance to PO medication Discontinue IV fluids  Continue iron supplement  LOS: 1 day    Kimberly Jefferson 06/17/2019, 11:56 AM

## 2019-06-17 NOTE — Plan of Care (Signed)
  Problem: Education: Goal: Required Educational Video(s) Outcome: Progressing

## 2019-06-18 DIAGNOSIS — Z90710 Acquired absence of both cervix and uterus: Secondary | ICD-10-CM

## 2019-06-18 LAB — TYPE AND SCREEN
ABO/RH(D): O POS
Antibody Screen: POSITIVE
Donor AG Type: NEGATIVE
Donor AG Type: NEGATIVE
Unit division: 0
Unit division: 0
Unit division: 0
Unit division: 0

## 2019-06-18 LAB — BPAM RBC
Blood Product Expiration Date: 202103272359
Blood Product Expiration Date: 202103272359
Blood Product Expiration Date: 202103272359
Blood Product Expiration Date: 202103272359
ISSUE DATE / TIME: 202102230830
ISSUE DATE / TIME: 202102230830
Unit Type and Rh: 5100
Unit Type and Rh: 5100
Unit Type and Rh: 5100
Unit Type and Rh: 5100

## 2019-06-18 MED ORDER — OXYCODONE-ACETAMINOPHEN 5-325 MG PO TABS: 1.0000 | ORAL_TABLET | ORAL | 0 refills | Status: DC | PRN

## 2019-06-18 MED ORDER — IBUPROFEN 600 MG PO TABS: 600.0000 mg | ORAL_TABLET | Freq: Four times a day (QID) | ORAL | 3 refills | Status: DC | PRN

## 2019-06-18 MED ORDER — DOCUSATE SODIUM 100 MG PO CAPS: 100.0000 mg | ORAL_CAPSULE | Freq: Two times a day (BID) | ORAL | 1 refills | Status: DC

## 2019-06-18 NOTE — Progress Notes (Signed)
Patient left facility before receiving discharge papers; This RN called that patient to inform her of her need for her instructions and she stated she would return to the hospital to receive them; Awaiting her arrival

## 2019-06-18 NOTE — Discharge Summary (Signed)
OB Discharge Summary     Patient Name: Kimberly Jefferson DOB: 27-Feb-1966 MRN: [id number]  Date of admission: 06/16/2019  Date of discharge: 06/18/2019  Admitting diagnosis: S/P hysterectomy [Z90.710] Intrauterine pregnancy: Unknown     Secondary diagnosis:  Active Problems:   S/P hysterectomy  Additional problems: anemia due to blood loss     Discharge diagnosis: s/p TAH/BS due to fibroid uterus and AUB                                  Hospital course:  Patient admitted for scheduled hysterectomy for the definitive management of AUB and symptomatic fibroid uterus. Patient underwent an uncomplicated surgery where a 700 gm fibroid uterus was removed. She received 2 units pRBC due to excessive blood loss and pre-op anemia status. Patient had an uncomplicated post op course. She ambulated, voided, passed flatus, tolerated a regular diet. Discharge instructions and precautions were reviewed with the patient. She was found stable for discharge of POD#2  Physical exam  Vitals:   06/17/19 0800 06/17/19 1529 06/17/19 2018 06/18/19 0443  BP:  123/66 (!) 145/79 (!) 157/71  Pulse:  69 76 (!) 57  Resp: (!) 22 16 16 16   Temp:  97.7 F (36.5 C) 98.6 F (37 C)   TempSrc:  Oral Oral   SpO2: 98% 100% 99% 99%  Weight:      Height:       GENERAL: Well-developed, well-nourished female in no acute distress.  LUNGS: Clear to auscultation bilaterally.  HEART: Regular rate and rhythm. ABDOMEN: Soft, nontender, nondistended. No organomegaly. Incision: clean, dry and intact EXTREMITIES: No cyanosis, clubbing, or edema, 2+ distal pulses.  Labs: Lab Results  Component Value Date   WBC 14.9 (H) 06/17/2019   HGB 9.7 (L) 06/17/2019   HCT 31.2 (L) 06/17/2019   MCV 74.1 (L) 06/17/2019   PLT 275 06/17/2019   CMP Latest Ref Rng & Units 06/16/2019  Glucose 70 - 99 mg/dL -  BUN 6 - 20 mg/dL -  Creatinine 0.44 - 1.00 mg/dL 0.87  Sodium 135 - 145 mmol/L -  Potassium 3.5 - 5.1 mmol/L -  Chloride 98 -  111 mmol/L -  CO2 22 - 32 mmol/L -  Calcium 8.9 - 10.3 mg/dL -  Total Protein 6.5 - 8.1 g/dL -  Total Bilirubin 0.3 - 1.2 mg/dL -  Alkaline Phos 38 - 126 U/L -  AST 15 - 41 U/L -  ALT 0 - 44 U/L -    Discharge instruction: per After Visit Summary and "Baby and Me Booklet".  After visit meds:  Allergies as of 06/18/2019      Reactions   Bee Venom Anaphylaxis   Doxycycline Nausea Only      Medication List    STOP taking these medications   megestrol 40 MG tablet Commonly known as: MEGACE     TAKE these medications   docusate sodium 100 MG capsule Commonly known as: COLACE Take 1 capsule (100 mg total) by mouth 2 (two) times daily.   ibuprofen 600 MG tablet Commonly known as: ADVIL Take 1 tablet (600 mg total) by mouth every 6 (six) hours as needed. What changed: Another medication with the same name was added. Make sure you understand how and when to take each.   ibuprofen 600 MG tablet Commonly known as: ADVIL Take 1 tablet (600 mg total) by mouth every 6 (six) hours as needed.  What changed: You were already taking a medication with the same name, and this prescription was added. Make sure you understand how and when to take each.   oxyCODONE-acetaminophen 5-325 MG tablet Commonly known as: PERCOCET/ROXICET Take 1 tablet by mouth every 4 (four) hours as needed for moderate pain or severe pain.       Diet: routine diet  Activity: Advance as tolerated. Pelvic rest for 6 weeks.   Outpatient follow up:4 weeks Follow up Appt: Future Appointments  Date Time Provider [place]  07/07/2019 11:15 AM Lashena Signer, Vickii Chafe, MD [place]    06/18/2019 Mora Bellman, MD

## 2019-06-18 NOTE — Discharge Instructions (Signed)
Abdominal Hysterectomy, Care After °This sheet gives you information about how to care for yourself after your procedure. Your health care provider may also give you more specific instructions. If you have problems or questions, contact your health care provider. °What can I expect after the procedure? °After your procedure, it is common to have: °· Pain. °· Fatigue. °· Poor appetite. °· Less interest in sex. °· Vaginal bleeding and discharge. You may need to use a sanitary napkin after this procedure. °Follow these instructions at home: °Bathing °· Do not take baths, swim, or use a hot tub until your health care provider approves. Ask your health care provider if you can take showers. You may only be allowed to take sponge baths for bathing. °· Keep the bandage (dressing) dry until your health care provider says it can be removed. °Incision care ° °· Follow instructions from your health care provider about how to take care of your incision. Make sure you: °? Wash your hands with soap and water before you change your bandage (dressing). If soap and water are not available, use hand sanitizer. °? Change your dressing as told by your health care provider. °? Leave stitches (sutures), skin glue, or adhesive strips in place. These skin closures may need to stay in place for 2 weeks or longer. If adhesive strip edges start to loosen and curl up, you may trim the loose edges. Do not remove adhesive strips completely unless your health care provider tells you to do that. °· Check your incision area every day for signs of infection. Check for: °? Redness, swelling, or pain. °? Fluid or blood. °? Warmth. °? Pus or a bad smell. °Activity °· Do gentle, daily exercises as told by your health care provider. You may be told to take short walks every day and go farther each time. °· Do not lift anything that is heavier than 10 lb (4.5 kg), or the limit that your health care provider tells you, until he or she says that it is  safe. °· Do not drive or use heavy machinery while taking prescription pain medicine. °· Do not drive for 24 hours if you were given a medicine to help you relax (sedative). °· Follow your health care provider's instructions about exercise, driving, and general activities. Ask your health care provider what activities are safe for you. °Lifestyle °· Do not douche, use tampons, or have sex for at least 6 weeks or as told by your health care provider. °· Do not drink alcohol until your health care provider approves. °· Drink enough fluid to keep your urine clear or pale yellow. °· Try to have someone at home with you for the first 1-2 weeks to help. °· Do not use any products that contain nicotine or tobacco, such as cigarettes and e-cigarettes. These can delay healing. If you need help quitting, ask your health care provider. °General instructions °· Take over-the-counter and prescription medicines only as told by your health care provider. °· Do not take aspirin or ibuprofen. These medicines can cause bleeding. °· To prevent or treat constipation while you are taking prescription pain medicine, your health care provider may recommend that you: °? Drink enough fluid to keep your urine clear or pale yellow. °? Take over-the-counter or prescription medicines. °? Eat foods that are high in fiber, such as fresh fruits and vegetables, whole grains, and beans. °? Limit foods that are high in fat and processed sugars, such as fried and sweet foods. °· Keep all   follow-up visits as told by your health care provider. This is important. Contact a health care provider if:  You have chills or fever.  You have redness, swelling, or pain around your incision.  You have fluid or blood coming from your incision.  Your incision feels warm to the touch.  You have pus or a bad smell coming from your incision.  Your incision breaks open.  You feel dizzy or light-headed.  You have pain or bleeding when you urinate.  You  have persistent diarrhea.  You have persistent nausea and vomiting.  You have abnormal vaginal discharge.  You have a rash.  You have any type of abnormal reaction or you develop an allergy to your medicine.  Your pain medicine does not help. Get help right away if:  You have a fever and your symptoms suddenly get worse.  You have severe abdominal pain.  You have shortness of breath.  You faint.  You have pain, swelling, or redness in your leg.  You have heavy vaginal bleeding with blood clots. Summary  After your procedure, it is common to have pain, fatigue and vaginal discharge.  Do not take baths, swim, or use a hot tub until your health care provider approves. Ask your health care provider if you can take showers. You may only be allowed to take sponge baths for bathing.  Follow your health care provider's instructions about exercise, driving, and general activities. Ask your health care provider what activities are safe for you.  Do not lift anything that is heavier than 10 lb (4.5 kg), or the limit that your health care provider tells you, until he or she says that it is safe.  Try to have someone at home with you for the first 1-2 weeks to help. This information is not intended to replace advice given to you by your health care provider. Make sure you discuss any questions you have with your health care provider. Document Revised: 05/13/2018 Document Reviewed: 03/28/2016 Elsevier Patient Education  2020 [employer]  

## 2019-07-02 ENCOUNTER — Other Ambulatory Visit: Payer: Self-pay

## 2019-07-02 ENCOUNTER — Ambulatory Visit (INDEPENDENT_AMBULATORY_CARE_PROVIDER_SITE_OTHER): Payer: 59

## 2019-07-02 DIAGNOSIS — Z5189 Encounter for other specified aftercare: Secondary | ICD-10-CM

## 2019-07-02 NOTE — Progress Notes (Signed)
Pt here today for incision check s/p abdominal hysterectomy on 06/16/19. Pt reporting foul odor to the area. Incision is clean, dry, and intact with well approximated edges. Minimal redness surrounding incision. Pt reports new itching to the area.   Reviewed pt's recent hx with Roselie Awkward, MD who assesses wound and states it appears to be healing appropriately. He would like to continue with current plan of care. Pt to follow up with Constant, MD at post-op visit on 3/16. Good wound care and s/s of infection reviewed with patient.   Apolonio Schneiders RN 07/02/19

## 2019-07-02 NOTE — Progress Notes (Signed)
Patient ID: Kimberly Jefferson, female   DOB: 03/07/1966, 54 y.o.   MRN: [id number] Patient seen and assessed by nursing staff during this encounter. I have reviewed the chart and agree with the documentation and plan. I examined the patient's wound myself.  Emeterio Reeve, MD 07/02/2019 5:25 PM

## 2019-07-04 ENCOUNTER — Other Ambulatory Visit: Payer: Self-pay | Admitting: Family Medicine

## 2019-07-07 ENCOUNTER — Other Ambulatory Visit: Payer: Self-pay

## 2019-07-07 ENCOUNTER — Encounter: Payer: Self-pay | Admitting: Obstetrics and Gynecology

## 2019-07-07 ENCOUNTER — Ambulatory Visit (INDEPENDENT_AMBULATORY_CARE_PROVIDER_SITE_OTHER): Payer: 59 | Admitting: Obstetrics and Gynecology

## 2019-07-07 VITALS — BP 121/78 | HR 87 | Wt 177.5 lb

## 2019-07-07 DIAGNOSIS — Z9889 Other specified postprocedural states: Secondary | ICD-10-CM

## 2019-07-07 NOTE — Progress Notes (Signed)
54 yo here for post op check. Patient had an abdominal hysterectomy in 05/2019 for the management of symptomatic fibroid uterus. Patient reports feeling well and is without complaints. She denies any fever, abdominal pain or abnormal drainage from her incision. She has returned to her regular activities.  Past Medical History:  Diagnosis Date  . Anxiety    no meds  . Dysmenorrhea   . Fibroids   . Heart murmur    "nothing to worry about" dx child, no problems ever  . History of blood transfusion  2013   heavy periods, MC 2 units transfuesd  . HTN (hypertension) 03/12/2012   one time no med  . Hypertension    one time  . Pneumonia     hx "had it once a year for a few years"  last time was 3- 4 years ago.  . SVD (spontaneous vaginal delivery)    x 2   Past Surgical History:  Procedure Laterality Date  . ABDOMINAL HYSTERECTOMY  06/16/2019  . [place]   x 1  . CHOLECYSTECTOMY  1990  . HYSTERECTOMY ABDOMINAL WITH SALPINGECTOMY Bilateral 06/16/2019   Procedure: HYSTERECTOMY ABDOMINAL WITH SALPINGECTOMY;  Surgeon: Mora Bellman, MD;  Location: [facility];  Service: Gynecology;  Laterality: Bilateral;  tap block per MD  . ORIF ANKLE FRACTURE Right 06/18/2012   Procedure: OPEN REDUCTION INTERNAL FIXATION (ORIF) ANKLE FRACTURE;  Surgeon: Augustin Schooling, MD;  Location: [facility];  Service: Orthopedics;  Laterality: Right;  . TUBAL LIGATION  1990   .fah Social History   Tobacco Use  . Smoking status: Current Some Day Smoker    Packs/day: 0.50    Years: 20.00    Pack years: 10.00    Types: Cigarettes  . Smokeless tobacco: Never Used  . Tobacco comment: per patient currently down to 1 cigarette/week  noted on 06/12/2019  Substance Use Topics  . Alcohol use: No  . Drug use: No   ROS  See pertinent in HPI. All other systems reviewed and negative Blood pressure 121/78, pulse 87, weight 177 lb 8 oz (80.5 kg), last menstrual period 05/10/2019.  GENERAL: Well-developed,  well-nourished female in no acute distress.  ABDOMEN: Soft, nontender, nondistended. No organomegaly. Incision: healed without erythema, induration or drainage PELVIC: Normal external female genitalia. Vagina is pink and rugated. Vaginal vault intact.  Normal discharge. No adnexal mass or tenderness. EXTREMITIES: No cyanosis, clubbing, or edema, 2+ distal pulses.  A/P 54 yo here for post op check - Patient is medically cleared to resume all activities of daily living - Urine culture collected as patient reports some urinary incontinence - RTC prn

## 2019-07-14 ENCOUNTER — Other Ambulatory Visit: Payer: Self-pay | Admitting: Obstetrics and Gynecology

## 2019-07-14 ENCOUNTER — Other Ambulatory Visit: Payer: Self-pay

## 2019-07-14 ENCOUNTER — Other Ambulatory Visit: Payer: 59

## 2019-07-16 LAB — URINE CULTURE

## 2019-07-22 ENCOUNTER — Other Ambulatory Visit: Payer: Self-pay | Admitting: Obstetrics & Gynecology

## 2019-07-22 DIAGNOSIS — R399 Unspecified symptoms and signs involving the genitourinary system: Secondary | ICD-10-CM

## 2019-07-22 MED ORDER — CEFADROXIL 500 MG PO CAPS: 500.0000 mg | ORAL_CAPSULE | Freq: Two times a day (BID) | ORAL | 0 refills | Status: AC

## 2019-08-12 ENCOUNTER — Encounter: Payer: Self-pay | Admitting: *Deleted

## 2020-01-05 ENCOUNTER — Encounter (HOSPITAL_COMMUNITY): Payer: Self-pay | Admitting: Emergency Medicine

## 2020-01-05 ENCOUNTER — Ambulatory Visit (HOSPITAL_COMMUNITY)
Admission: EM | Admit: 2020-01-05 | Discharge: 2020-01-05 | Disposition: A | Discharge status: Home / Self Care | Payer: 59 | Attending: Internal Medicine | Admitting: Internal Medicine

## 2020-01-05 ENCOUNTER — Emergency Department (HOSPITAL_COMMUNITY)
Admission: EM | Admit: 2020-01-05 | Discharge: 2020-01-06 | Disposition: A | Discharge status: Home / Self Care | Payer: 59 | Attending: Emergency Medicine | Admitting: Emergency Medicine

## 2020-01-05 ENCOUNTER — Other Ambulatory Visit: Payer: Self-pay

## 2020-01-05 ENCOUNTER — Emergency Department (HOSPITAL_COMMUNITY): Payer: 59

## 2020-01-05 ENCOUNTER — Encounter (HOSPITAL_COMMUNITY): Payer: Self-pay | Admitting: *Deleted

## 2020-01-05 DIAGNOSIS — I10 Essential (primary) hypertension: Secondary | ICD-10-CM | POA: Insufficient documentation

## 2020-01-05 DIAGNOSIS — R079 Chest pain, unspecified: Secondary | ICD-10-CM

## 2020-01-05 DIAGNOSIS — R0789 Other chest pain: Secondary | ICD-10-CM | POA: Insufficient documentation

## 2020-01-05 DIAGNOSIS — H81399 Other peripheral vertigo, unspecified ear: Secondary | ICD-10-CM

## 2020-01-05 DIAGNOSIS — Z79899 Other long term (current) drug therapy: Secondary | ICD-10-CM | POA: Insufficient documentation

## 2020-01-05 DIAGNOSIS — R110 Nausea: Secondary | ICD-10-CM | POA: Insufficient documentation

## 2020-01-05 DIAGNOSIS — R0989 Other specified symptoms and signs involving the circulatory and respiratory systems: Secondary | ICD-10-CM

## 2020-01-05 DIAGNOSIS — F17210 Nicotine dependence, cigarettes, uncomplicated: Secondary | ICD-10-CM | POA: Insufficient documentation

## 2020-01-05 LAB — BASIC METABOLIC PANEL
Anion gap: 10 (ref 5–15)
BUN: 7 mg/dL (ref 6–20)
CO2: 24 mmol/L (ref 22–32)
Calcium: 9.2 mg/dL (ref 8.9–10.3)
Chloride: 104 mmol/L (ref 98–111)
Creatinine, Ser: 0.71 mg/dL (ref 0.44–1.00)
GFR calc Af Amer: >60 mL/min (ref >60)
GFR calc non Af Amer: >60 mL/min (ref >60)
Glucose, Bld: 92 mg/dL (ref 70–99)
Potassium: 3.8 mmol/L (ref 3.5–5.1)
Sodium: 138 mmol/L (ref 135–145)

## 2020-01-05 LAB — CBC
HCT: 47.8 % — ABNORMAL HIGH (ref 36.0–46.0)
Hemoglobin: 16 g/dL — ABNORMAL HIGH (ref 12.0–15.0)
MCH: 29.6 pg (ref 26.0–34.0)
MCHC: 33.5 g/dL (ref 30.0–36.0)
MCV: 88.5 fL (ref 80.0–100.0)
Platelets: 232 10*3/uL (ref 150–400)
RBC: 5.4 MIL/uL — ABNORMAL HIGH (ref 3.87–5.11)
RDW: 15.7 % — ABNORMAL HIGH (ref 11.5–15.5)
WBC: 10.1 10*3/uL (ref 4.0–10.5)
nRBC: 0 % (ref 0.0–0.2)

## 2020-01-05 LAB — TROPONIN I (HIGH SENSITIVITY)
Troponin I (High Sensitivity): 4 ng/L (ref <18)
Troponin I (High Sensitivity): 3 ng/L (ref <18)

## 2020-01-05 NOTE — ED Notes (Signed)
Pt upset about wait time. Explained ER process with patient, pt feels it is unethical to make people wait this long to be seen. Apologized about wait time. Pt expressed that her chest pain was getting worse. This RN suggested patient get a repeat EKG done, pt not agreeable. Pt states she is calling 911 from waiting room to be taken to [facility]. Pt's VSS, pt appears in NAD. Pt has negative troponin x 2. Pt resp e/u, pt speaking in complete sentences.

## 2020-01-05 NOTE — ED Triage Notes (Signed)
Patient presents to [facility] for assessment upper, central palpitations starting this weekend, and then a sharp intermittent pain starting today.  Patient states "it feels like if I threw up it would just be a river".  Patient C/o right arm pain, denies left arm pain, denies jaw pain.  C/o lower back pain.  Patient states she had a blood pressure of 176/0 last night, 148/0 this morning.

## 2020-01-05 NOTE — ED Notes (Signed)
Patient is being discharged from the Urgent Care and sent to the Emergency Department on foot. Per Debara Pickett, APP, patient is in need of higher level of care due to changes to EKG with inversion noted. Patient is aware and verbalizes understanding of plan of care.  Vitals:   01/05/20 0921  BP: (!) 141/82  Pulse: (!) 50  Resp: 18  Temp: 98.2 F (36.8 C)  SpO2: 98%

## 2020-01-05 NOTE — ED Triage Notes (Signed)
Pt sent here from [facility]. Reports having sharp chest pains this am. Recently having HTN, which she has no hx of. Reports SBP 150-170 over past few days. Had mild headache and dizziness with the HTN. No acute distress is noted at triage.

## 2020-01-05 NOTE — ED Notes (Signed)
Patient able to ambulate independently  

## 2020-01-06 MED ORDER — MECLIZINE HCL 25 MG PO TABS
25.0000 mg | ORAL_TABLET | Freq: Once | ORAL | Status: AC
  Administered 2020-01-06: 25 mg via ORAL
  Filled 2020-01-06: qty 1

## 2020-01-06 MED ORDER — ONDANSETRON HCL 4 MG PO TABS: 4.0000 mg | ORAL_TABLET | Freq: Three times a day (TID) | ORAL | 0 refills | Status: DC | PRN

## 2020-01-06 MED ORDER — HYDROCHLOROTHIAZIDE 25 MG PO TABS: 25.0000 mg | ORAL_TABLET | Freq: Every day | ORAL | 0 refills | Status: DC

## 2020-01-06 MED ORDER — MECLIZINE HCL 25 MG PO TABS: 25.0000 mg | ORAL_TABLET | Freq: Three times a day (TID) | ORAL | 0 refills | Status: DC | PRN

## 2020-01-06 MED ORDER — ONDANSETRON HCL 4 MG PO TABS
4.0000 mg | ORAL_TABLET | Freq: Once | ORAL | Status: AC
  Administered 2020-01-06: 4 mg via ORAL
  Filled 2020-01-06: qty 1

## 2020-01-06 NOTE — Discharge Instructions (Addendum)
Please take ondansetron as needed for nausea.  Please take meclizine 3 times a day for the next 4 days, then 3 times a day as needed for dizziness.  Check your blood pressure once or twice a day for the next 2 weeks.  If the average reading is higher than 140 for the top number or higher than 90 for the bottom number, then start taking hydrochlorothiazide once a day.  Stay on a low-salt diet.  Return if you are having any problems.

## 2020-01-06 NOTE — ED Provider Notes (Signed)
[facility] EMERGENCY DEPARTMENT Provider Note   CSN: [id number] Arrival date & time: 01/05/20  4098   History Chief Complaint  Patient presents with  . Chest Pain  . Hypertension    Kimberly Jefferson is a 54 y.o. female.  The history is provided by the patient.  Chest Pain Hypertension Associated symptoms include chest pain.  She has a history of anxiety and comes in with complaints of some intermittent chest pain, dizziness over about the last 4days.  Dizziness is a spinning sensation which is only present for a few seconds before resolving.  There is associated nausea.  It does not matter what position she is in, and it is not affected by movement.  She has had some nausea without vomiting.  Chest pain is described as sometimes sharp, sometimes burning and is also present for only few seconds at a time.  She denies dyspnea or diaphoresis.  She had received her second Covid vaccine on September 6 and wonders if some of this might be related to the vaccine.  She got concerned and has been checking her blood pressure and it has been consistently high over the last 3days, as high as 175 on the top number.  She states that her blood pressure is usually low, and there is no family history of hypertension.  However, she has put on weight recently.  Her diet is also relatively high in sodium.  Past Medical History:  Diagnosis Date  . Anxiety    no meds  . Dysmenorrhea   . Fibroids   . Heart murmur    "nothing to worry about" dx child, no problems ever  . History of blood transfusion  2013   heavy periods, MC 2 units transfuesd  . HTN (hypertension) 03/12/2012   one time no med  . Hypertension    one time  . Pneumonia     hx "had it once a year for a few years"  last time was 3- 4 years ago.  . SVD (spontaneous vaginal delivery)    x 2    Patient Active Problem List   Diagnosis Date Noted  . S/P hysterectomy 06/16/2019  . ASCUS with positive high risk HPV  cervical 06/10/2019  . Symptomatic anemia 05/12/2019  . Iron deficiency anemia due to chronic blood loss 05/12/2019  . Abnormal uterine bleeding (AUB) 05/12/2019  . Vitamin B12 deficiency anemia 03/22/2016  . Heavy menstrual bleeding 03/22/2016  . URI (upper respiratory infection) 03/22/2016  . Folate deficiency 03/22/2016  . Uterine fibroid 03/22/2016  . Increased endometrial stripe 03/22/2016  . Ankle fracture, right 06/18/2012  . Hypokalemia 03/13/2012  . Chest pain 03/12/2012  . HTN (hypertension) 03/12/2012  . Trichomonas 03/12/2012  . Left sided numbness 03/11/2012  . Syncope 03/11/2012  . Anemia 03/11/2012    Past Surgical History:  Procedure Laterality Date  . ABDOMINAL HYSTERECTOMY  06/16/2019  . [place]   x 1  . CHOLECYSTECTOMY  1990  . HYSTERECTOMY ABDOMINAL WITH SALPINGECTOMY Bilateral 06/16/2019   Procedure: HYSTERECTOMY ABDOMINAL WITH SALPINGECTOMY;  Surgeon: Mora Bellman, MD;  Location: [place];  Service: Gynecology;  Laterality: Bilateral;  tap block per MD  . ORIF ANKLE FRACTURE Right 06/18/2012   Procedure: OPEN REDUCTION INTERNAL FIXATION (ORIF) ANKLE FRACTURE;  Surgeon: Augustin Schooling, MD;  Location: [place];  Service: Orthopedics;  Laterality: Right;  . TUBAL LIGATION  1990     OB History   No obstetric history on file.  Family History  Problem Relation Age of Onset  . Healthy Mother   . Healthy Father   . Breast cancer Maternal Aunt     Social History   Tobacco Use  . Smoking status: Current Some Day Smoker    Packs/day: 0.50    Years: 20.00    Pack years: 10.00    Types: Cigarettes  . Smokeless tobacco: Never Used  . Tobacco comment: per patient currently down to 1 cigarette/week  noted on 06/12/2019  Vaping Use  . Vaping Use: Never used  Substance Use Topics  . Alcohol use: No  . Drug use: No    Home Medications Prior to Admission medications   Medication Sig Start Date End Date Taking? Authorizing Provider    docusate sodium (COLACE) 100 MG capsule Take 1 capsule (100 mg total) by mouth 2 (two) times daily. 06/18/19   Constant, Peggy, MD  hydrochlorothiazide (HYDRODIURIL) 25 MG tablet Take 1 tablet (25 mg total) by mouth daily. 11/06/94   Delora Fuel, MD  ibuprofen (ADVIL) 600 MG tablet Take 1 tablet (600 mg total) by mouth every 6 (six) hours as needed. 06/18/19   Constant, Peggy, MD  meclizine (ANTIVERT) 25 MG tablet Take 1 tablet (25 mg total) by mouth 3 (three) times daily as needed for dizziness. 7/89/38   Delora Fuel, MD  ondansetron (ZOFRAN) 4 MG tablet Take 1 tablet (4 mg total) by mouth every 8 (eight) hours as needed for nausea or vomiting. 04/23/73   Delora Fuel, MD  albuterol (PROVENTIL HFA;VENTOLIN HFA) 108 (90 Base) MCG/ACT inhaler Inhale 2 puffs into the lungs every 4 (four) hours as needed for wheezing or shortness of breath. Patient not taking: Reported on 01/30/2018 12/17/17 10/17/18  Carlisle Cater, PA-C    Allergies    Bee venom and Doxycycline  Review of Systems   Review of Systems  Cardiovascular: Positive for chest pain.  All other systems reviewed and are negative.   Physical Exam Updated Vital Signs BP (!) 169/81 (BP Location: Right Arm)   Pulse (!) 49   Temp 98 F (36.7 C) (Oral)   Resp 16   Ht 5\' 7"  (1.702 m)   Wt 77.1 kg   LMP 05/10/2019 (Exact Date)   SpO2 98%   BMI 26.63 kg/m   Physical Exam Vitals and nursing note reviewed.   54 year old female, resting comfortably and in no acute distress. Vital signs are significant for slow heart rate and elevated blood pressure. Oxygen saturation is 98%, which is normal. Head is normocephalic and atraumatic. PERRLA, EOMI. Oropharynx is clear. Neck is nontender and supple without adenopathy or JVD. Back is nontender and there is no CVA tenderness. Lungs are clear without rales, wheezes, or rhonchi. Chest is nontender. Heart has regular rate and rhythm without murmur. Abdomen is soft, flat, nontender without masses  or hepatosplenomegaly and peristalsis is normoactive. Extremities have no cyanosis or edema, full range of motion is present. Skin is warm and dry without rash. Neurologic: Mental status is normal, cranial nerves are intact, there are no motor or sensory deficits.  Dizziness is not reproduced by passive head movement.  ED Results / Procedures / Treatments   Labs (all labs ordered are listed, but only abnormal results are displayed) Labs Reviewed  CBC - Abnormal; Notable for the following components:      Result Value   RBC 5.40 (*)    Hemoglobin 16.0 (*)    HCT 47.8 (*)    RDW 15.7 (*)  All other components within normal limits  BASIC METABOLIC PANEL  TROPONIN I (HIGH SENSITIVITY)  TROPONIN I (HIGH SENSITIVITY)    EKG EKG Interpretation  Date/Time:  Tuesday January 05 2020 09:44:53 EDT Ventricular Rate:  51 PR Interval:  150 QRS Duration: 86 QT Interval:  426 QTC Calculation: 392 R Axis:   67 Text Interpretation: Sinus bradycardia ST & T wave abnormality, consider inferolateral ischemia Abnormal ECG When compared with ECG of EARLIER SAME DATE No significant change was found Confirmed by Delora Fuel ([id number]) on 01/05/2020 11:22:18 PM   Radiology DG Chest 2 View  Result Date: 01/05/2020 CLINICAL DATA:  Chest pain. Hypertension. EXAM: CHEST - 2 VIEW COMPARISON:  01/30/2018 FINDINGS: The cardiac silhouette, mediastinal and hilar contours are normal. The lungs are clear. No pleural effusions. No pulmonary lesions. The bony thorax is intact. IMPRESSION: No acute cardiopulmonary findings. Electronically Signed   By: Marijo Sanes M.D.   On: 01/05/2020 10:37    Procedures Procedures  Medications Ordered in ED Medications  meclizine (ANTIVERT) tablet 25 mg (has no administration in time range)  ondansetron (ZOFRAN) tablet 4 mg (has no administration in time range)    ED Course  I have reviewed the triage vital signs and the nursing notes.  Pertinent labs & imaging  results that were available during my care of the patient were reviewed by me and considered in my medical decision making (see chart for details).  MDM Rules/Calculators/A&P Chest pain which seems very atypical, very low risk of ACS.  Also very atypical for pulmonary embolism, pneumonia.  ECG does have some ST and T changes in the inferolateral leads which I feel is most likely secondary to left ventricular hypertrophy.  ECG has been stable over the last 6 months.  Troponin is normal x2.  Chest x-ray is unremarkable.  Blood pressure has been checked numerous times and it has been elevated about half of the time and normal about half of the time.  It is unclear at this time whether she truly has hypertension.  I have recommended that she continue to monitor her blood pressure as an outpatient over the next 2 weeks and, if consistently elevated, start on antihypertensives.  She is given a prescription for ondansetron for nausea and meclizine for dizziness.  Given prescription for hydrochlorothiazide but only to start taking it if blood pressures consistently elevated over the next 2 weeks.  I have recommended that she try to stay on a low-sodium diet.  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  Return precautions discussed.  Final Clinical Impression(s) / ED Diagnoses Final diagnoses:  Atypical chest pain  Peripheral vertigo, unspecified laterality  Labile hypertension    Rx / DC Orders ED Discharge Orders         Ordered    ondansetron (ZOFRAN) 4 MG tablet  Every 8 hours PRN        01/06/20 0422    meclizine (ANTIVERT) 25 MG tablet  3 times daily PRN        01/06/20 0422    hydrochlorothiazide (HYDRODIURIL) 25 MG tablet  Daily        01/06/20 0254           Delora Fuel, MD 27/06/23 [phone]

## 2020-03-24 ENCOUNTER — Encounter: Payer: Self-pay | Admitting: General Practice

## 2020-06-12 IMAGING — MG DIGITAL SCREENING BILAT W/ CAD
4 series · 4 of 4 positions shown · non-contrast
Comparison: None.

CLINICAL DATA: Screening.

EXAM:
DIGITAL SCREENING BILATERAL MAMMOGRAM WITH CAD

[L CC]
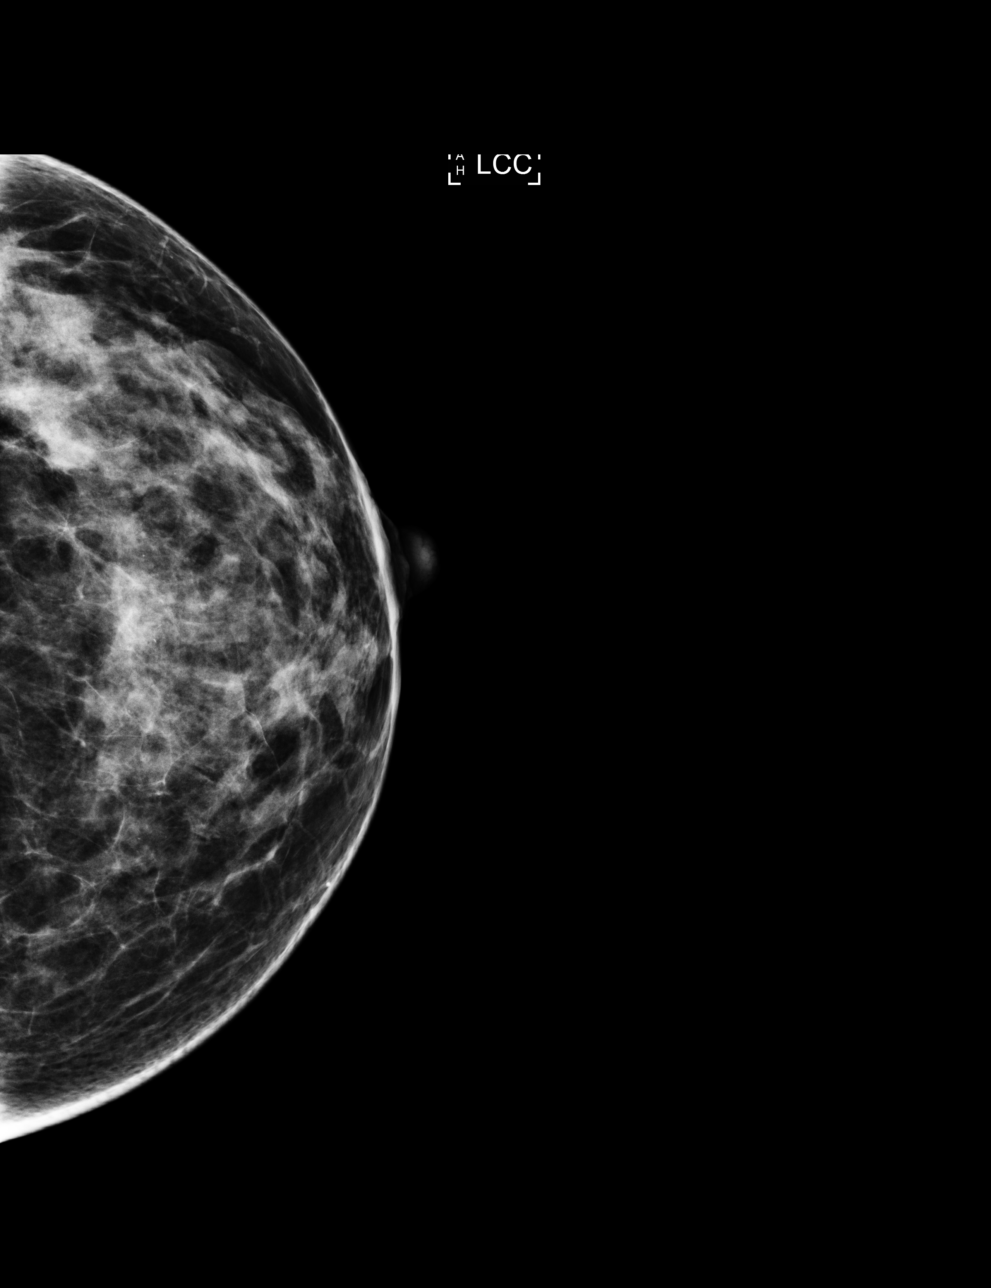

[R CC]
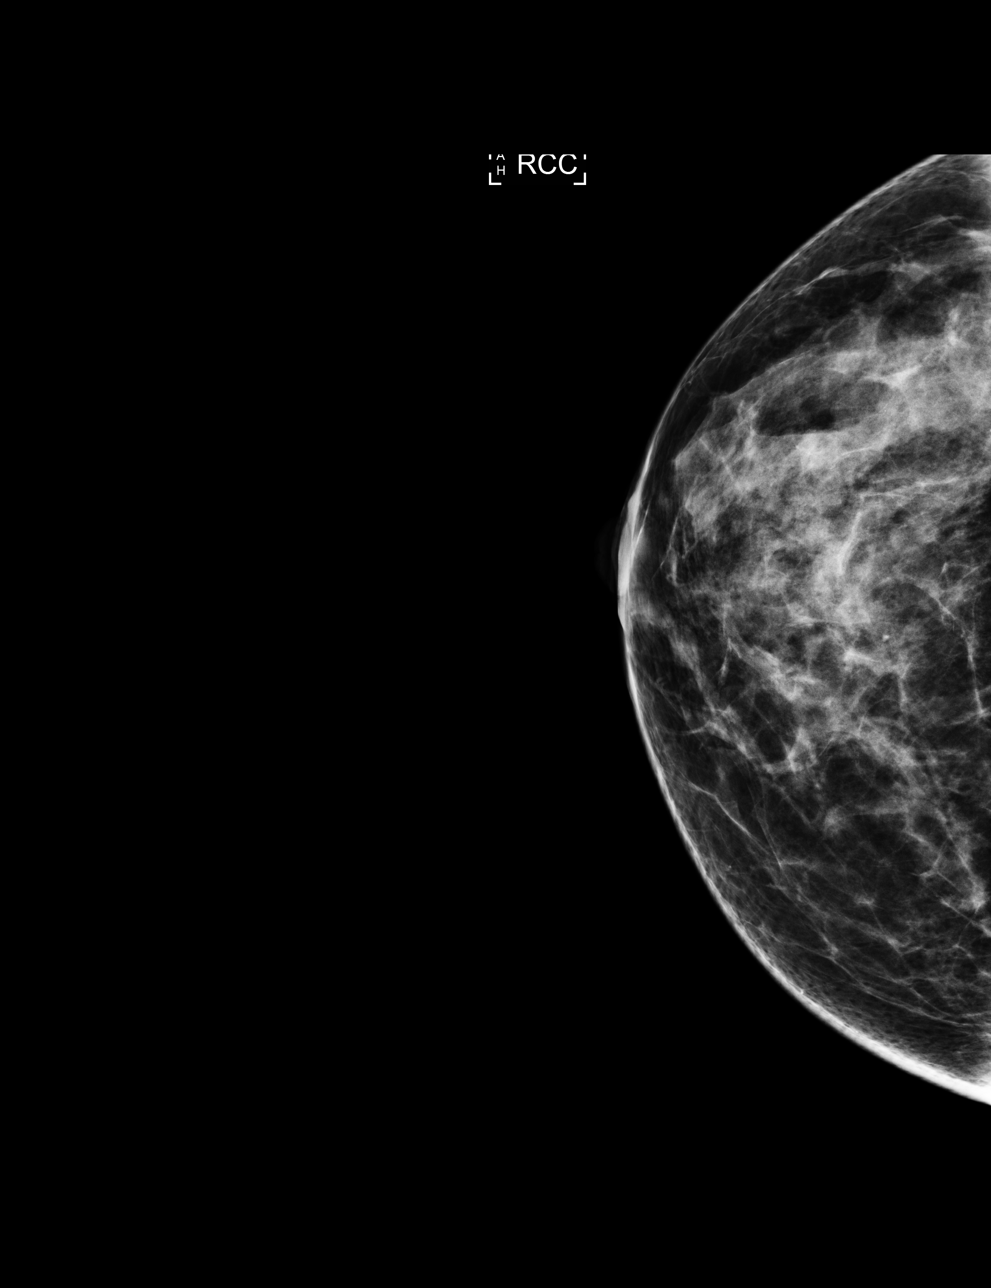

[R MLO]
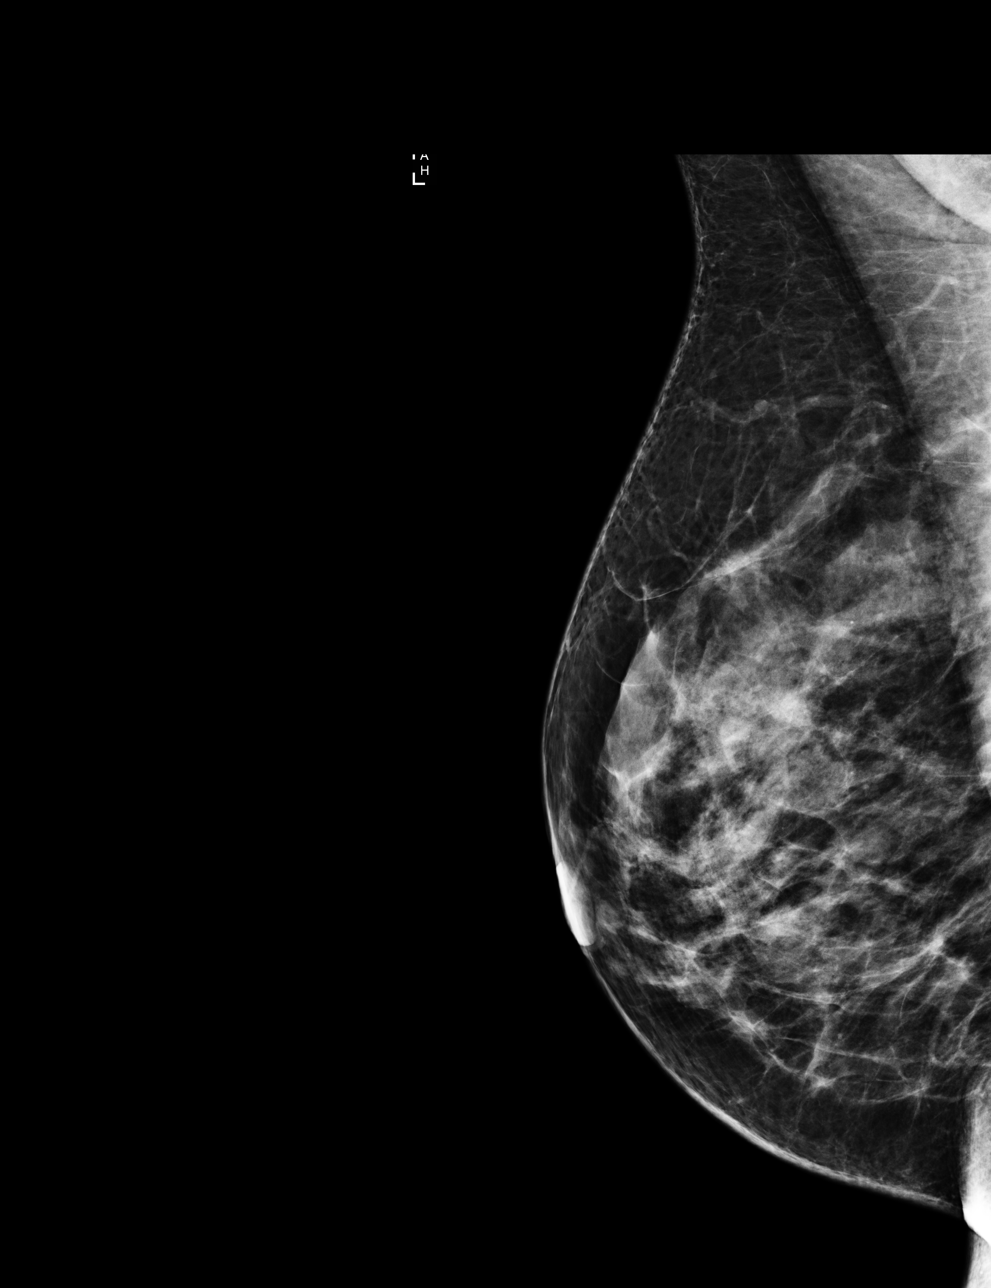

[L MLO]
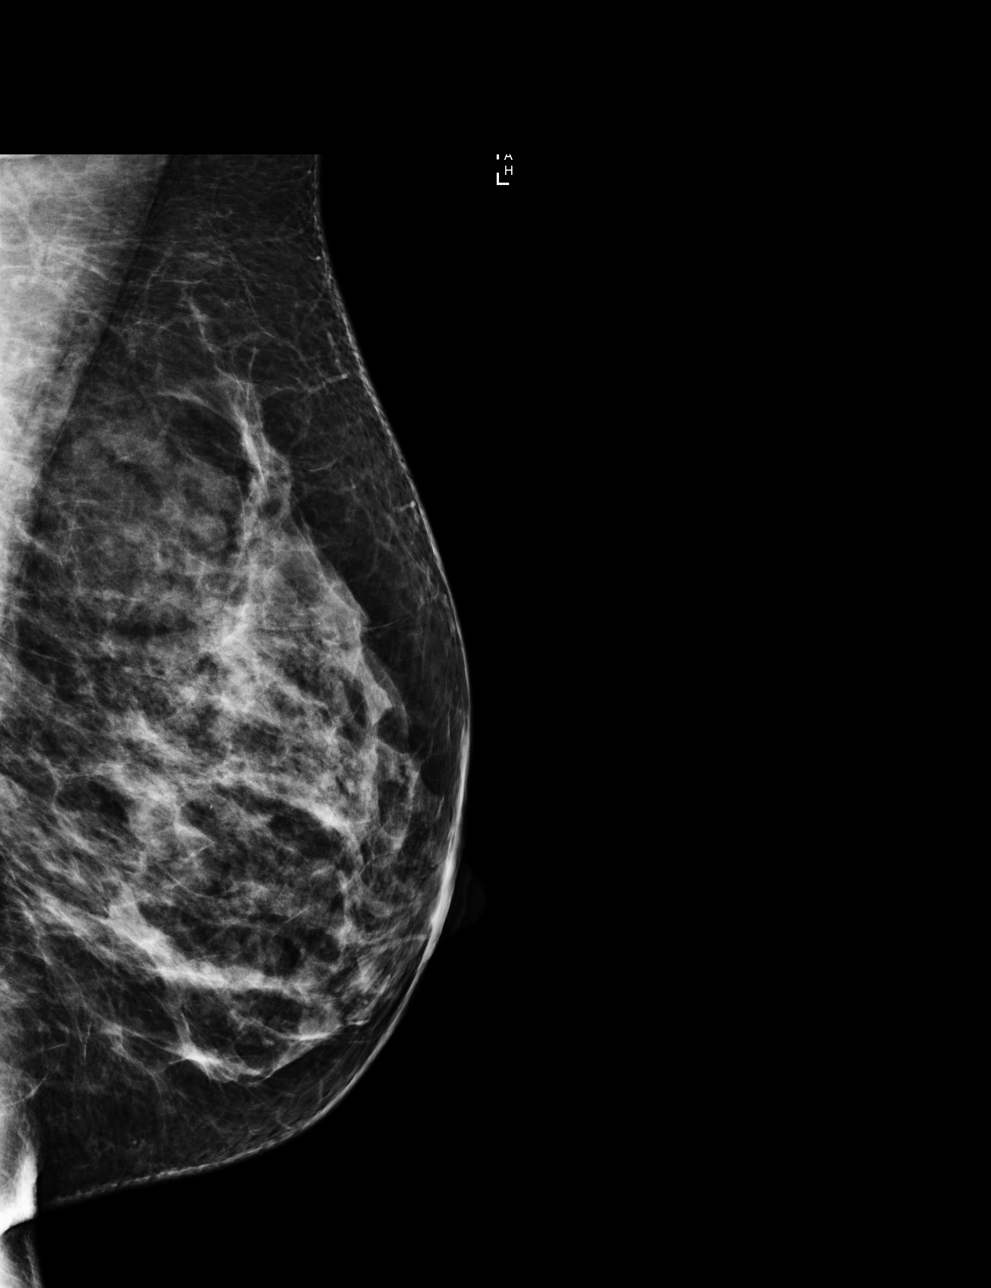

[4 of 4 positions shown; findings below may reference images not displayed]

ACR Breast Density Category c: The breast tissue is heterogeneously
dense, which may obscure small masses
FINDINGS: There are no findings suspicious for malignancy. Images were
processed with CAD.
IMPRESSION: No mammographic evidence of malignancy. A result letter of this
screening mammogram will be mailed directly to the patient.

RECOMMENDATION:
Screening mammogram in one year. (Code:U2-0-761)

BI-RADS CATEGORY  1: Negative.

## 2021-01-08 IMAGING — DX DG CHEST 2V
2 series · 2 of 2 positions shown · non-contrast
Comparison: 01/30/2018

CLINICAL DATA: Chest pain. Hypertension.

EXAM:
CHEST - 2 VIEW

[w chest pa]
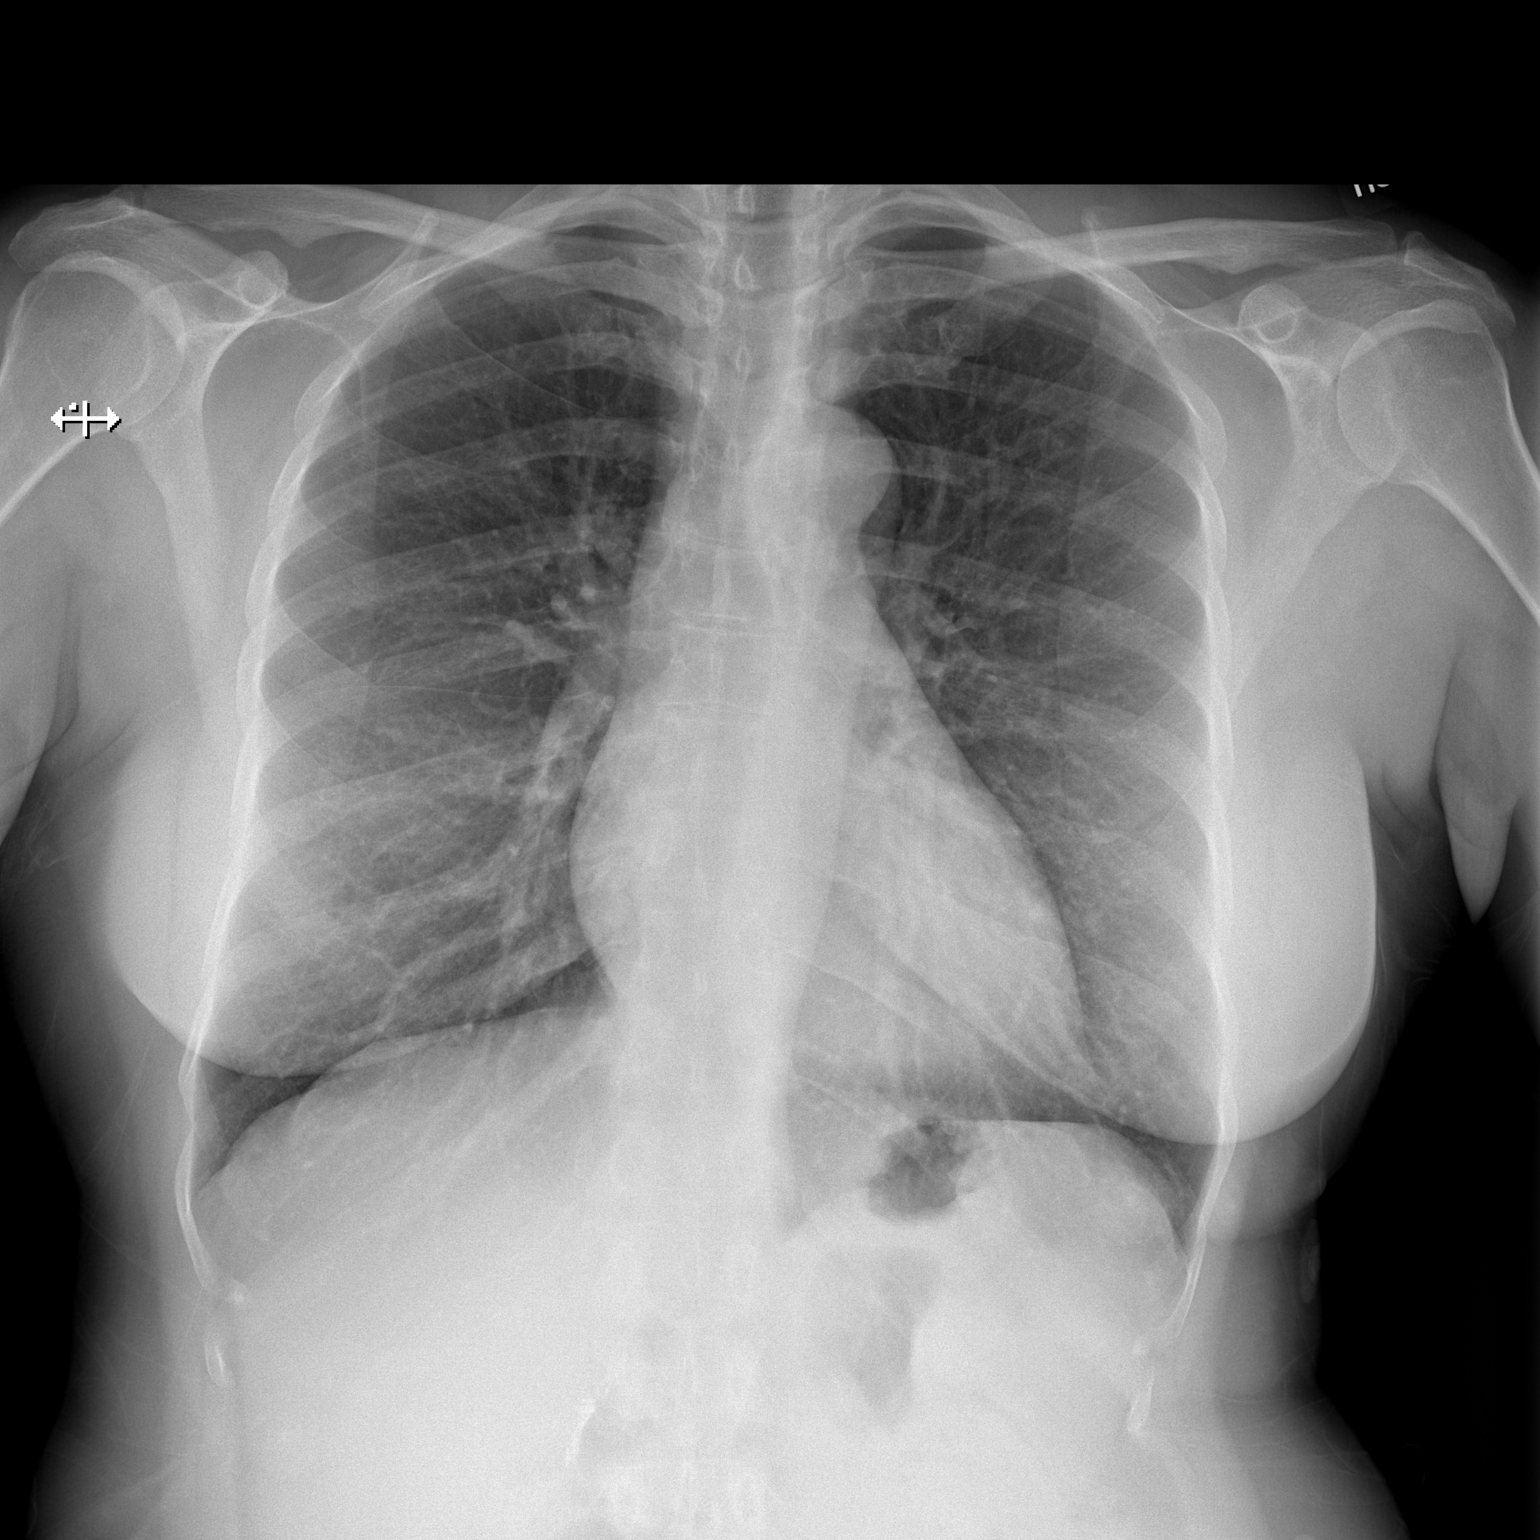

[w chest lat]
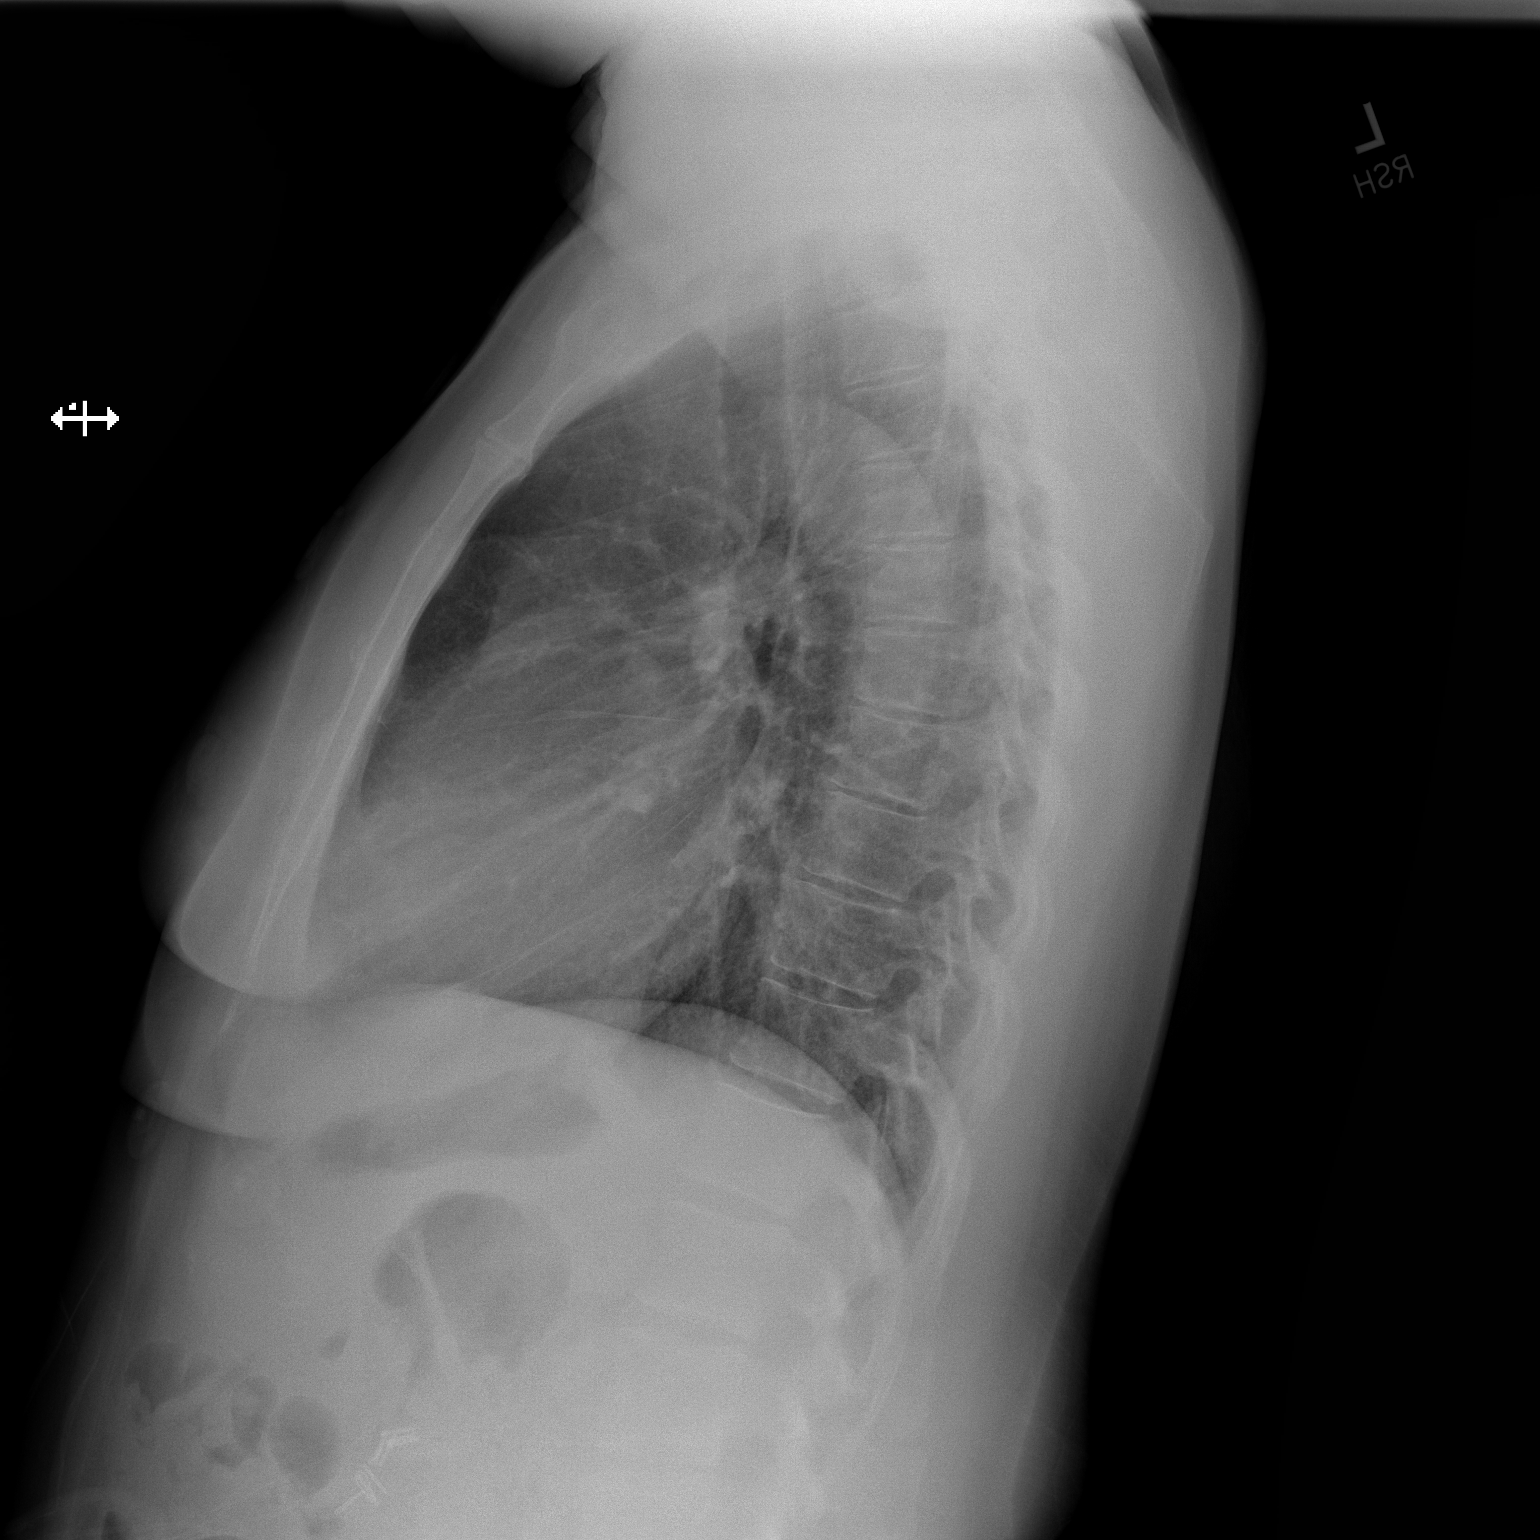

[2 of 2 positions shown; findings below may reference images not displayed]

FINDINGS: The cardiac silhouette, mediastinal and hilar contours are normal.
The lungs are clear. No pleural effusions. No pulmonary lesions. The
bony thorax is intact.
IMPRESSION: No acute cardiopulmonary findings.

## 2021-03-11 ENCOUNTER — Emergency Department (HOSPITAL_COMMUNITY): Payer: Self-pay

## 2021-03-11 ENCOUNTER — Emergency Department (HOSPITAL_COMMUNITY)
Admission: EM | Admit: 2021-03-11 | Discharge: 2021-03-11 | Disposition: A | Discharge status: Home / Self Care | Payer: Self-pay | Attending: Emergency Medicine | Admitting: Emergency Medicine

## 2021-03-11 ENCOUNTER — Encounter (HOSPITAL_COMMUNITY): Payer: Self-pay | Admitting: Emergency Medicine

## 2021-03-11 ENCOUNTER — Ambulatory Visit (HOSPITAL_COMMUNITY)
Admission: EM | Admit: 2021-03-11 | Discharge: 2021-03-11 | Disposition: A | Discharge status: Home / Self Care | Payer: Self-pay

## 2021-03-11 ENCOUNTER — Other Ambulatory Visit: Payer: Self-pay

## 2021-03-11 DIAGNOSIS — R079 Chest pain, unspecified: Secondary | ICD-10-CM

## 2021-03-11 DIAGNOSIS — R001 Bradycardia, unspecified: Secondary | ICD-10-CM

## 2021-03-11 DIAGNOSIS — R519 Headache, unspecified: Secondary | ICD-10-CM | POA: Insufficient documentation

## 2021-03-11 DIAGNOSIS — H538 Other visual disturbances: Secondary | ICD-10-CM | POA: Insufficient documentation

## 2021-03-11 DIAGNOSIS — M542 Cervicalgia: Secondary | ICD-10-CM | POA: Insufficient documentation

## 2021-03-11 DIAGNOSIS — R404 Transient alteration of awareness: Secondary | ICD-10-CM

## 2021-03-11 DIAGNOSIS — I10 Essential (primary) hypertension: Secondary | ICD-10-CM | POA: Insufficient documentation

## 2021-03-11 DIAGNOSIS — F17210 Nicotine dependence, cigarettes, uncomplicated: Secondary | ICD-10-CM | POA: Insufficient documentation

## 2021-03-11 LAB — CBC WITH DIFFERENTIAL/PLATELET
Abs Immature Granulocytes: 0.05 10*3/uL (ref 0.00–0.07)
Basophils Absolute: 0.1 10*3/uL (ref 0.0–0.1)
Basophils Relative: 0 %
Eosinophils Absolute: 0.2 10*3/uL (ref 0.0–0.5)
Eosinophils Relative: 1 %
HCT: 47.7 % — ABNORMAL HIGH (ref 36.0–46.0)
Hemoglobin: 16.4 g/dL — ABNORMAL HIGH (ref 12.0–15.0)
Immature Granulocytes: 0 %
Lymphocytes Relative: 18 %
Lymphs Abs: 2.8 10*3/uL (ref 0.7–4.0)
MCH: 31.6 pg (ref 26.0–34.0)
MCHC: 34.4 g/dL (ref 30.0–36.0)
MCV: 91.9 fL (ref 80.0–100.0)
Monocytes Absolute: 0.8 10*3/uL (ref 0.1–1.0)
Monocytes Relative: 5 %
Neutro Abs: 12 10*3/uL — ABNORMAL HIGH (ref 1.7–7.7)
Neutrophils Relative %: 76 %
Platelets: 232 10*3/uL (ref 150–400)
RBC: 5.19 MIL/uL — ABNORMAL HIGH (ref 3.87–5.11)
RDW: 13.1 % (ref 11.5–15.5)
WBC: 15.8 10*3/uL — ABNORMAL HIGH (ref 4.0–10.5)
nRBC: 0 % (ref 0.0–0.2)

## 2021-03-11 LAB — COMPREHENSIVE METABOLIC PANEL
ALT: 28 U/L (ref 0–44)
AST: 24 U/L (ref 15–41)
Albumin: 4.4 g/dL (ref 3.5–5.0)
Alkaline Phosphatase: 110 U/L (ref 38–126)
Anion gap: 8 (ref 5–15)
BUN: 9 mg/dL (ref 6–20)
CO2: 28 mmol/L (ref 22–32)
Calcium: 9.6 mg/dL (ref 8.9–10.3)
Chloride: 101 mmol/L (ref 98–111)
Creatinine, Ser: 0.9 mg/dL (ref 0.44–1.00)
GFR, Estimated: >60 mL/min (ref >60)
Glucose, Bld: 106 mg/dL — ABNORMAL HIGH (ref 70–99)
Potassium: 4.1 mmol/L (ref 3.5–5.1)
Sodium: 137 mmol/L (ref 135–145)
Total Bilirubin: 1 mg/dL (ref 0.3–1.2)
Total Protein: 7.2 g/dL (ref 6.5–8.1)

## 2021-03-11 LAB — CBG MONITORING, ED: Glucose-Capillary: 98 mg/dL (ref 70–99)

## 2021-03-11 LAB — TROPONIN I (HIGH SENSITIVITY)
Troponin I (High Sensitivity): 5 ng/L (ref <18)
Troponin I (High Sensitivity): 5 ng/L (ref <18)

## 2021-03-11 MED ORDER — METOCLOPRAMIDE HCL 10 MG PO TABS: 10.0000 mg | ORAL_TABLET | Freq: Four times a day (QID) | ORAL | 0 refills | Status: DC | PRN

## 2021-03-11 MED ORDER — MECLIZINE HCL 25 MG PO TABS
25.0000 mg | ORAL_TABLET | Freq: Once | ORAL | Status: AC
  Administered 2021-03-11: 25 mg via ORAL
  Filled 2021-03-11: qty 1

## 2021-03-11 MED ORDER — METOCLOPRAMIDE HCL 5 MG/ML IJ SOLN
10.0000 mg | Freq: Once | INTRAMUSCULAR | Status: AC
  Administered 2021-03-11: 10 mg via INTRAVENOUS
  Filled 2021-03-11: qty 2

## 2021-03-11 MED ORDER — IOHEXOL 350 MG/ML SOLN
80.0000 mL | Freq: Once | INTRAVENOUS | Status: AC | PRN
  Administered 2021-03-11: 80 mL via INTRAVENOUS

## 2021-03-11 MED ORDER — DIPHENHYDRAMINE HCL 50 MG/ML IJ SOLN
25.0000 mg | Freq: Once | INTRAMUSCULAR | Status: AC
  Administered 2021-03-11: 25 mg via INTRAVENOUS
  Filled 2021-03-11: qty 1

## 2021-03-11 MED ORDER — SODIUM CHLORIDE 0.9 % IV BOLUS
1000.0000 mL | Freq: Once | INTRAVENOUS | Status: AC
  Administered 2021-03-11: 1000 mL via INTRAVENOUS

## 2021-03-11 MED ORDER — CYCLOBENZAPRINE HCL 10 MG PO TABS
5.0000 mg | ORAL_TABLET | Freq: Once | ORAL | Status: AC
  Administered 2021-03-11: 5 mg via ORAL
  Filled 2021-03-11: qty 1

## 2021-03-11 NOTE — ED Triage Notes (Signed)
Pt to triage via [place] from [facility].  Reports headache that radiates down L side of neck and into chest x 2 hours.  UCC reported pt was altered but alert and oriented on EMS arrival.    BP 180/100 Took Tylenol when pain started without relief.

## 2021-03-11 NOTE — ED Provider Notes (Signed)
Emergency Medicine Provider Triage Evaluation Note  Kimberly Jefferson , a 55 y.o. female  was evaluated in triage.  Pt complains of neck pain and chest pain.  Reports that pain was intermittent over the last 2 days however became worse over the last 2 hours.  Has been reports that the pain radiates into her chest.  Patient describes pain as "sharp."  Review of Systems  Positive: Neck pain, chest pain, dizziness Negative: Shortness of breath, syncope, abdominal pain, nausea, vomiting  Physical Exam  BP (!) 182/111 (BP Location: Left Arm)   Pulse 65   Temp 99.5 F (37.5 C) (Oral)   Resp 14   LMP 05/10/2019 (Exact Date)   SpO2 100%  Gen:   Awake, no distress   Resp:  Normal effort, lungs clear to auscultation bilaterally MSK:   Moves extremities without difficulty   Other:  No facial asymmetry or dysarthria.  +2 radial pulse bilaterally.  Abdomen soft, nondistended, nontender.  Medical Decision Making  Medically screening exam initiated at 6:41 PM.  Appropriate orders placed.  Kimberly Jefferson was informed that the remainder of the evaluation will be completed by another provider, this initial triage assessment does not replace that evaluation, and the importance of remaining in the ED until their evaluation is complete.  Patient was discussed with and evaluated by Dr. Jeanell Sparrow.    Loni Beckwith, PA-C 03/11/21 1842    Pattricia Boss, MD 03/12/21 [phone]

## 2021-03-11 NOTE — ED Provider Notes (Signed)
[facility] EMERGENCY DEPARTMENT Provider Note   CSN: [id number] Arrival date & time: 03/11/21  1808     History Chief Complaint  Patient presents with   Headache   Chest Pain    Kimberly Jefferson is a 55 y.o. female hx of HTN, here presenting with headache and chest pain.  Patient states that for the last several months she has been having intermittent left-sided headaches.  Associated with some blurry vision.  States that the pain radiate down to the neck and then the chest as well.  States that usually last several minutes but today it lasted for several hours.  Denies any fevers.  Patient went to urgent care and was noted to be slightly altered and sent here for further evaluation.  Denies any history of seizures.  No seizure activity reported  The history is provided by the patient.      Past Medical History:  Diagnosis Date   Anxiety    no meds   Dysmenorrhea    Fibroids    Heart murmur    "nothing to worry about" dx child, no problems ever   History of blood transfusion  2013   heavy periods, MC 2 units transfuesd   HTN (hypertension) 03/12/2012   one time no med   Hypertension    one time   Pneumonia     hx "had it once a year for a few years"  last time was 3- 4 years ago.   SVD (spontaneous vaginal delivery)    x 2    Patient Active Problem List   Diagnosis Date Noted   S/P hysterectomy 06/16/2019   ASCUS with positive high risk HPV cervical 06/10/2019   Symptomatic anemia 05/12/2019   Iron deficiency anemia due to chronic blood loss 05/12/2019   Abnormal uterine bleeding (AUB) 05/12/2019   Vitamin B12 deficiency anemia 03/22/2016   Heavy menstrual bleeding 03/22/2016   URI (upper respiratory infection) 03/22/2016   Folate deficiency 03/22/2016   Uterine fibroid 03/22/2016   Increased endometrial stripe 03/22/2016   Ankle fracture, right 06/18/2012   Hypokalemia 03/13/2012   Chest pain 03/12/2012   HTN (hypertension) 03/12/2012    Trichomonas 03/12/2012   Left sided numbness 03/11/2012   Syncope 03/11/2012   Anemia 03/11/2012    Past Surgical History:  Procedure Laterality Date   ABDOMINAL HYSTERECTOMY  06/16/2019   CESAREAN SECTION  1988   x 1   CHOLECYSTECTOMY  1990   HYSTERECTOMY ABDOMINAL WITH SALPINGECTOMY Bilateral 06/16/2019   Procedure: HYSTERECTOMY ABDOMINAL WITH SALPINGECTOMY;  Surgeon: Mora Bellman, MD;  Location: [place];  Service: Gynecology;  Laterality: Bilateral;  tap block per MD   ORIF ANKLE FRACTURE Right 06/18/2012   Procedure: OPEN REDUCTION INTERNAL FIXATION (ORIF) ANKLE FRACTURE;  Surgeon: Augustin Schooling, MD;  Location: [place];  Service: Orthopedics;  Laterality: Right;   TUBAL LIGATION  1990     OB History   No obstetric history on file.     Family History  Problem Relation Age of Onset   Healthy Mother    Healthy Father    Breast cancer Maternal Aunt     Social History   Tobacco Use   Smoking status: Some Days    Packs/day: 0.50    Years: 20.00    Pack years: 10.00    Types: Cigarettes   Smokeless tobacco: Never   Tobacco comments:    per patient currently down to 1 cigarette/week  noted on 06/12/2019  Vaping Use  Vaping Use: Never used  Substance Use Topics   Alcohol use: No   Drug use: No    Home Medications Prior to Admission medications   Medication Sig Start Date End Date Taking? Authorizing Provider  docusate sodium (COLACE) 100 MG capsule Take 1 capsule (100 mg total) by mouth 2 (two) times daily. 06/18/19   Constant, Peggy, MD  hydrochlorothiazide (HYDRODIURIL) 25 MG tablet Take 1 tablet (25 mg total) by mouth daily. 8/85/02   Delora Fuel, MD  ibuprofen (ADVIL) 600 MG tablet Take 1 tablet (600 mg total) by mouth every 6 (six) hours as needed. 06/18/19   Constant, Peggy, MD  meclizine (ANTIVERT) 25 MG tablet Take 1 tablet (25 mg total) by mouth 3 (three) times daily as needed for dizziness. 7/74/12   Delora Fuel, MD  ondansetron (ZOFRAN) 4 MG tablet Take 1  tablet (4 mg total) by mouth every 8 (eight) hours as needed for nausea or vomiting. 8/78/67   Delora Fuel, MD  albuterol (PROVENTIL HFA;VENTOLIN HFA) 108 (90 Base) MCG/ACT inhaler Inhale 2 puffs into the lungs every 4 (four) hours as needed for wheezing or shortness of breath. Patient not taking: Reported on 01/30/2018 12/17/17 10/17/18  Carlisle Cater, PA-C    Allergies    Bee venom and Doxycycline  Review of Systems   Review of Systems  Cardiovascular:  Positive for chest pain.  Neurological:  Positive for headaches.  All other systems reviewed and are negative.  Physical Exam Updated Vital Signs BP (!) 155/79   Pulse 61   Temp 99.5 F (37.5 C) (Oral)   Resp 18   LMP 05/10/2019 (Exact Date)   SpO2 97%   Physical Exam Vitals and nursing note reviewed.  Constitutional:      Comments: Uncomfortable  HENT:     Head: Normocephalic.     Mouth/Throat:     Mouth: Mucous membranes are moist.  Eyes:     Extraocular Movements: Extraocular movements intact.  Neck:     Comments: Mild tenderness in the left paracervical area Cardiovascular:     Rate and Rhythm: Normal rate and regular rhythm.     Heart sounds: Normal heart sounds.  Pulmonary:     Effort: Pulmonary effort is normal.     Breath sounds: Normal breath sounds.  Abdominal:     General: Bowel sounds are normal.     Palpations: Abdomen is soft.  Musculoskeletal:        General: Normal range of motion.     Cervical back: Normal range of motion and neck supple.  Skin:    General: Skin is warm.  Neurological:     Mental Status: She is alert and oriented to person, place, and time.     Comments: No obvious facial droop.  Patient has normal strength and sensation bilateral arms and legs.  Normal finger-to-nose bilaterally  Psychiatric:        Mood and Affect: Mood normal.        Behavior: Behavior normal.    ED Results / Procedures / Treatments   Labs (all labs ordered are listed, but only abnormal results are  displayed) Labs Reviewed  COMPREHENSIVE METABOLIC PANEL - Abnormal; Notable for the following components:      Result Value   Glucose, Bld 106 (*)    All other components within normal limits  CBC WITH DIFFERENTIAL/PLATELET - Abnormal; Notable for the following components:   WBC 15.8 (*)    RBC 5.19 (*)    Hemoglobin 16.4 (*)  HCT 47.7 (*)    Neutro Abs 12.0 (*)    All other components within normal limits  TROPONIN I (HIGH SENSITIVITY)  TROPONIN I (HIGH SENSITIVITY)    EKG EKG Interpretation  Date/Time:  Saturday March 11 2021 18:33:21 EST Ventricular Rate:  59 PR Interval:  152 QRS Duration: 78 QT Interval:  412 QTC Calculation: 407 R Axis:   50 Text Interpretation: Sinus bradycardia with marked sinus arrhythmia Otherwise normal ECG No significant change since last tracing Confirmed by Wandra Arthurs [phone]) on 03/11/2021 9:08:10 PM  Radiology CT ANGIO HEAD NECK W WO CM  Result Date: 03/11/2021 CLINICAL DATA:  Dizziness with headache EXAM: CT ANGIOGRAPHY HEAD AND NECK TECHNIQUE: Multidetector CT imaging of the head and neck was performed using the standard protocol during bolus administration of intravenous contrast. Multiplanar CT image reconstructions and MIPs were obtained to evaluate the vascular anatomy. Carotid stenosis measurements (when applicable) are obtained utilizing NASCET criteria, using the distal internal carotid diameter as the denominator. CONTRAST:  33mL OMNIPAQUE IOHEXOL 350 MG/ML SOLN COMPARISON:  None. FINDINGS: CT HEAD FINDINGS Brain: There is no mass, hemorrhage or extra-axial collection. The size and configuration of the ventricles and extra-axial CSF spaces are normal. There is no acute or chronic infarction. The brain parenchyma is normal. Skull: The visualized skull base, calvarium and extracranial soft tissues are normal. Sinuses/Orbits: No fluid levels or advanced mucosal thickening of the visualized paranasal sinuses. No mastoid or middle ear  effusion. The orbits are normal. CTA NECK FINDINGS SKELETON: There is no bony spinal canal stenosis. No lytic or blastic lesion. OTHER NECK: Normal pharynx, larynx and major salivary glands. No cervical lymphadenopathy. Unremarkable thyroid gland. UPPER CHEST: No pneumothorax or pleural effusion. No nodules or masses. AORTIC ARCH: There is no calcific atherosclerosis of the aortic arch. There is no aneurysm, dissection or hemodynamically significant stenosis of the visualized portion of the aorta. Conventional 3 vessel aortic branching pattern. The visualized proximal subclavian arteries are widely patent. RIGHT CAROTID SYSTEM: Normal without aneurysm, dissection or stenosis. LEFT CAROTID SYSTEM: Normal without aneurysm, dissection or stenosis. VERTEBRAL ARTERIES: Left dominant configuration. Both origins are clearly patent. There is no dissection, occlusion or flow-limiting stenosis to the skull base (V1-V3 segments). CTA HEAD FINDINGS POSTERIOR CIRCULATION: --Vertebral arteries: Normal V4 segments. --Inferior cerebellar arteries: Normal. --Basilar artery: Normal. --Superior cerebellar arteries: Normal. --Posterior cerebral arteries (PCA): Normal. ANTERIOR CIRCULATION: --Intracranial internal carotid arteries: Normal. --Anterior cerebral arteries (ACA): Normal. Both A1 segments are present. Patent anterior communicating artery (a-comm). --Middle cerebral arteries (MCA): Normal. VENOUS SINUSES: As permitted by contrast timing, patent. ANATOMIC VARIANTS: None Review of the MIP images confirms the above findings. IMPRESSION: Normal CTA of the head and neck. Electronically Signed   By: Ulyses Jarred M.D.   On: 03/11/2021 22:40   DG Chest 1 View  Result Date: 03/11/2021 CLINICAL DATA:  Chest pain EXAM: CHEST  1 VIEW COMPARISON:  01/05/2020 FINDINGS: The heart size and mediastinal contours are within normal limits. Both lungs are clear. The visualized skeletal structures are unremarkable. IMPRESSION: No active  disease. Electronically Signed   By: Inez Catalina M.D.   On: 03/11/2021 19:40    Procedures Procedures   Medications Ordered in ED Medications  metoCLOPramide (REGLAN) injection 10 mg (10 mg Intravenous Given 03/11/21 2126)  diphenhydrAMINE (BENADRYL) injection 25 mg (25 mg Intravenous Given 03/11/21 2128)  sodium chloride 0.9 % bolus 1,000 mL (1,000 mLs Intravenous New Bag/Given 03/11/21 2130)  cyclobenzaprine (FLEXERIL) tablet 5 mg (5 mg Oral  Given 03/11/21 2130)  meclizine (ANTIVERT) tablet 25 mg (25 mg Oral Given 03/11/21 2130)  iohexol (OMNIPAQUE) 350 MG/ML injection 80 mL (80 mLs Intravenous Contrast Given 03/11/21 2207)    ED Course  I have reviewed the triage vital signs and the nursing notes.  Pertinent labs & imaging results that were available during my care of the patient were reviewed by me and considered in my medical decision making (see chart for details).    MDM Rules/Calculators/A&P                           Ryelynn Guedea is a 55 y.o. female here presenting with headache and neck pain.  Likely complex migraines.  Patient is neurovascular intact currently.  This been going on for several weeks.  We will get CTA head and neck to rule out LVO versus dissection.  Will give migraine cocktail  11:13 PM CTA showed no dissection or mass.  Patient felt better after migraine cocktail.  Likely has complex migraines.  Will refer to neurology outpatient.  Of note patient did have chest pain and troponin negative x2 and I do not think she has ACS or dissection or PE.  Final Clinical Impression(s) / ED Diagnoses Final diagnoses:  None    Rx / DC Orders ED Discharge Orders     None        Drenda Freeze, MD 03/11/21 2313

## 2021-03-11 NOTE — ED Notes (Signed)
Charge nurse called and reported pt will go by EMS.

## 2021-03-11 NOTE — ED Notes (Signed)
E-signature pad unavailable at time of pt discharge. This RN discussed discharge materials with pt and answered all pt questions. Pt stated understanding of discharge material. ? ?

## 2021-03-11 NOTE — ED Notes (Signed)
[facility] has not truck available.

## 2021-03-11 NOTE — Discharge Instructions (Addendum)
Take Tylenol Motrin for headaches.  Take Reglan as needed if you have severe headache  You likely have complicated migraines so please follow-up with neurology  Return to ER if you have worse headaches, chest pain, numbness or weakness

## 2021-03-11 NOTE — ED Notes (Signed)
EMS emergent transport cardiac monitor requested.

## 2021-03-11 NOTE — ED Provider Notes (Signed)
[place]    CSN: [id number] Arrival date & time: 03/11/21  1708      History   Chief Complaint No chief complaint on file.   HPI Kimberly Jefferson is a 55 y.o. female.   I was called into triage to evaluate patient by nursing staff.  She reports chest pain for several days that is radiating into her neck and head.  Upon coming into the room patient appeared confused and was in obvious discomfort.  She had difficulty identifying where she was and respond to questions very slowly.  She denies history of diabetes and does not take insulin.  She denies any recent injury.  Patient had difficulty giving history.   Past Medical History:  Diagnosis Date   Anxiety    no meds   Dysmenorrhea    Fibroids    Heart murmur    "nothing to worry about" dx child, no problems ever   History of blood transfusion  2013   heavy periods, MC 2 units transfuesd   HTN (hypertension) 03/12/2012   one time no med   Hypertension    one time   Pneumonia     hx "had it once a year for a few years"  last time was 3- 4 years ago.   SVD (spontaneous vaginal delivery)    x 2    Patient Active Problem List   Diagnosis Date Noted   S/P hysterectomy 06/16/2019   ASCUS with positive high risk HPV cervical 06/10/2019   Symptomatic anemia 05/12/2019   Iron deficiency anemia due to chronic blood loss 05/12/2019   Abnormal uterine bleeding (AUB) 05/12/2019   Vitamin B12 deficiency anemia 03/22/2016   Heavy menstrual bleeding 03/22/2016   URI (upper respiratory infection) 03/22/2016   Folate deficiency 03/22/2016   Uterine fibroid 03/22/2016   Increased endometrial stripe 03/22/2016   Ankle fracture, right 06/18/2012   Hypokalemia 03/13/2012   Chest pain 03/12/2012   HTN (hypertension) 03/12/2012   Trichomonas 03/12/2012   Left sided numbness 03/11/2012   Syncope 03/11/2012   Anemia 03/11/2012    Past Surgical History:  Procedure Laterality Date   ABDOMINAL HYSTERECTOMY   06/16/2019   CESAREAN SECTION  1988   x 1   CHOLECYSTECTOMY  1990   HYSTERECTOMY ABDOMINAL WITH SALPINGECTOMY Bilateral 06/16/2019   Procedure: HYSTERECTOMY ABDOMINAL WITH SALPINGECTOMY;  Surgeon: Mora Bellman, MD;  Location: [place];  Service: Gynecology;  Laterality: Bilateral;  tap block per MD   ORIF ANKLE FRACTURE Right 06/18/2012   Procedure: OPEN REDUCTION INTERNAL FIXATION (ORIF) ANKLE FRACTURE;  Surgeon: Augustin Schooling, MD;  Location: [place];  Service: Orthopedics;  Laterality: Right;   TUBAL LIGATION  1990    OB History   No obstetric history on file.      Home Medications    Prior to Admission medications   Medication Sig Start Date End Date Taking? Authorizing Provider  docusate sodium (COLACE) 100 MG capsule Take 1 capsule (100 mg total) by mouth 2 (two) times daily. 06/18/19   Constant, Peggy, MD  hydrochlorothiazide (HYDRODIURIL) 25 MG tablet Take 1 tablet (25 mg total) by mouth daily. 6/94/85   Delora Fuel, MD  ibuprofen (ADVIL) 600 MG tablet Take 1 tablet (600 mg total) by mouth every 6 (six) hours as needed. 06/18/19   Constant, Peggy, MD  meclizine (ANTIVERT) 25 MG tablet Take 1 tablet (25 mg total) by mouth 3 (three) times daily as needed for dizziness. 4/62/70   Delora Fuel, MD  ondansetron (ZOFRAN) 4 MG tablet Take 1 tablet (4 mg total) by mouth every 8 (eight) hours as needed for nausea or vomiting. 09/26/28   Delora Fuel, MD  albuterol (PROVENTIL HFA;VENTOLIN HFA) 108 (90 Base) MCG/ACT inhaler Inhale 2 puffs into the lungs every 4 (four) hours as needed for wheezing or shortness of breath. Patient not taking: Reported on 01/30/2018 12/17/17 10/17/18  Carlisle Cater, PA-C    Family History Family History  Problem Relation Age of Onset   Healthy Mother    Healthy Father    Breast cancer Maternal Aunt     Social History Social History   Tobacco Use   Smoking status: Some Days    Packs/day: 0.50    Years: 20.00    Pack years: 10.00    Types: Cigarettes    Smokeless tobacco: Never   Tobacco comments:    per patient currently down to 1 cigarette/week  noted on 06/12/2019  Vaping Use   Vaping Use: Never used  Substance Use Topics   Alcohol use: No   Drug use: No     Allergies   Bee venom and Doxycycline   Review of Systems Review of Systems  Unable to perform ROS: Other    Physical Exam Triage Vital Signs ED Triage Vitals [03/11/21 1717]  Enc Vitals Group     BP (!) 179/128     Pulse Rate (!) 53     Resp      Temp 98.5 F (36.9 C)     Temp Source Oral     SpO2      Weight      Height      Head Circumference      Peak Flow      Pain Score      Pain Loc      Pain Edu?      Excl. in [place]?    No data found.  Updated Vital Signs BP (!) 179/128 (BP Location: Right Arm)   Pulse (!) 53   Temp 98.5 F (36.9 C) (Oral)   LMP 05/10/2019 (Exact Date)   Visual Acuity Right Eye Distance:   Left Eye Distance:   Bilateral Distance:    Right Eye Near:   Left Eye Near:    Bilateral Near:     Physical Exam Vitals reviewed.  Constitutional:      General: She is awake. She is not in acute distress.    Appearance: Normal appearance. She is ill-appearing.     Comments: Lying on exam room in no acute distress obviously uncomfortable  HENT:     Head: Normocephalic and atraumatic.  Cardiovascular:     Rate and Rhythm: Regular rhythm. Bradycardia present.     Heart sounds: No murmur heard. Pulmonary:     Effort: Pulmonary effort is normal.  Abdominal:     Palpations: Abdomen is soft.     Tenderness: There is no abdominal tenderness.  Psychiatric:        Behavior: Behavior is cooperative.     UC Treatments / Results  Labs (all labs ordered are listed, but only abnormal results are displayed) Labs Reviewed  CBG MONITORING, ED    EKG   Radiology No results found.  Procedures Procedures (including critical care time)  Medications Ordered in UC Medications - No data to display  Initial Impression /  Assessment and Plan / UC Course  I have reviewed the triage vital signs and the nursing notes.  Pertinent labs & imaging results that  were available during my care of the patient were reviewed by me and considered in my medical decision making (see chart for details).     EKG obtained showed sinus bradycardia without ischemic changes.  Compared to 01/05/2020 tracing T wave inversion in lead III and V1.  Glucose was 98.  Discussed that we do not have capabilities to assess patient given clinical presentation recommended she go to the emergency room.  Given altered level of consciousness with elevated blood pressure will go by EMS.  [facility] did not have any trucks available so [facility] was contacted to transport patient.  Final Clinical Impressions(s) / UC Diagnoses   Final diagnoses:  Chest pain, unspecified type  Symptomatic bradycardia  Altered consciousness   Discharge Instructions   None    ED Prescriptions   None    PDMP not reviewed this encounter.   Terrilee Croak, PA-C 03/11/21 1740

## 2021-03-11 NOTE — ED Notes (Signed)
Patient transported to CT 

## 2021-03-31 ENCOUNTER — Encounter (HOSPITAL_COMMUNITY): Payer: Self-pay

## 2021-03-31 ENCOUNTER — Ambulatory Visit (HOSPITAL_COMMUNITY)
Admission: EM | Admit: 2021-03-31 | Discharge: 2021-03-31 | Disposition: A | Discharge status: Home / Self Care | Payer: Self-pay | Attending: Emergency Medicine | Admitting: Emergency Medicine

## 2021-03-31 DIAGNOSIS — M545 Low back pain, unspecified: Secondary | ICD-10-CM

## 2021-03-31 MED ORDER — PREDNISONE 10 MG (21) PO TBPK: ORAL_TABLET | Freq: Every day | ORAL | 0 refills | Status: DC

## 2021-03-31 MED ORDER — NAPROXEN 500 MG PO TABS: 500.0000 mg | ORAL_TABLET | Freq: Two times a day (BID) | ORAL | 0 refills | Status: DC
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
## 2021-05-15 ENCOUNTER — Ambulatory Visit (INDEPENDENT_AMBULATORY_CARE_PROVIDER_SITE_OTHER): Payer: Self-pay

## 2021-05-15 ENCOUNTER — Other Ambulatory Visit: Payer: Self-pay

## 2021-05-15 ENCOUNTER — Ambulatory Visit (HOSPITAL_COMMUNITY)
Admission: EM | Admit: 2021-05-15 | Discharge: 2021-05-15 | Disposition: A | Discharge status: Home / Self Care | Payer: Self-pay | Attending: Family Medicine | Admitting: Family Medicine

## 2021-05-15 ENCOUNTER — Encounter (HOSPITAL_COMMUNITY): Payer: Self-pay

## 2021-05-15 DIAGNOSIS — M545 Low back pain, unspecified: Secondary | ICD-10-CM

## 2021-05-15 DIAGNOSIS — R42 Dizziness and giddiness: Secondary | ICD-10-CM | POA: Insufficient documentation

## 2021-05-15 DIAGNOSIS — M5441 Lumbago with sciatica, right side: Secondary | ICD-10-CM | POA: Insufficient documentation

## 2021-05-15 DIAGNOSIS — M79604 Pain in right leg: Secondary | ICD-10-CM | POA: Insufficient documentation

## 2021-05-15 DIAGNOSIS — I700 Atherosclerosis of aorta: Secondary | ICD-10-CM | POA: Insufficient documentation

## 2021-05-15 DIAGNOSIS — Y92410 Unspecified street and highway as the place of occurrence of the external cause: Secondary | ICD-10-CM | POA: Insufficient documentation

## 2021-05-15 DIAGNOSIS — F17210 Nicotine dependence, cigarettes, uncomplicated: Secondary | ICD-10-CM | POA: Insufficient documentation

## 2021-05-15 DIAGNOSIS — U071 COVID-19: Secondary | ICD-10-CM | POA: Insufficient documentation

## 2021-05-15 DIAGNOSIS — I10 Essential (primary) hypertension: Secondary | ICD-10-CM | POA: Insufficient documentation

## 2021-05-15 DIAGNOSIS — J069 Acute upper respiratory infection, unspecified: Secondary | ICD-10-CM | POA: Insufficient documentation

## 2021-05-15 DIAGNOSIS — R202 Paresthesia of skin: Secondary | ICD-10-CM | POA: Insufficient documentation

## 2021-05-15 LAB — SARS CORONAVIRUS 2 (TAT 6-24 HRS): SARS Coronavirus 2: POSITIVE — AB

## 2021-05-15 MED ORDER — TIZANIDINE HCL 4 MG PO TABS: 4.0000 mg | ORAL_TABLET | Freq: Three times a day (TID) | ORAL | 0 refills | Status: DC | PRN

## 2021-05-15 MED ORDER — KETOROLAC TROMETHAMINE 30 MG/ML IJ SOLN
INTRAMUSCULAR | Status: AC
  Filled 2021-05-15: qty 1

## 2021-05-15 MED ORDER — IBUPROFEN 800 MG PO TABS: 800.0000 mg | ORAL_TABLET | Freq: Three times a day (TID) | ORAL | 0 refills | Status: DC | PRN

## 2021-05-15 MED ORDER — HYDROCHLOROTHIAZIDE 25 MG PO TABS: 25.0000 mg | ORAL_TABLET | Freq: Every day | ORAL | 0 refills | Status: DC

## 2021-05-15 MED ORDER — KETOROLAC TROMETHAMINE 30 MG/ML IJ SOLN
30.0000 mg | Freq: Once | INTRAMUSCULAR | Status: AC
  Administered 2021-05-15: 30 mg via INTRAMUSCULAR
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
## 2021-05-15 NOTE — Discharge Instructions (Addendum)
°  You have been swabbed for COVID, and the test will result in the next 24 hours. Our staff will call you if positive. If the test is positive, you should quarantine for 5 days.   The x-rays did show some degenerative changes like arthritis in your spine.  You do have some calcifications in the aorta.  This is the big artery that runs along your spine from your heart.  You were given an injection of Toradol today  Take tizanidine 4 mg 1 every 8 hours as needed for muscle spasm half take hydrochlorothiazide 25 mg 1 daily for blood pressure

## 2021-05-16 ENCOUNTER — Telehealth (HOSPITAL_COMMUNITY): Payer: Self-pay | Admitting: Emergency Medicine

## 2021-05-16 MED ORDER — NIRMATRELVIR/RITONAVIR (PAXLOVID)TABLET: 3.0000 | ORAL_TABLET | Freq: Two times a day (BID) | ORAL | 0 refills | Status: AC

## 2021-12-06 ENCOUNTER — Encounter (HOSPITAL_COMMUNITY): Payer: Self-pay

## 2021-12-06 ENCOUNTER — Ambulatory Visit (HOSPITAL_COMMUNITY)
Admission: EM | Admit: 2021-12-06 | Discharge: 2021-12-06 | Disposition: A | Discharge status: Home / Self Care | Payer: Self-pay | Attending: Nurse Practitioner | Admitting: Nurse Practitioner

## 2021-12-06 DIAGNOSIS — H1032 Unspecified acute conjunctivitis, left eye: Secondary | ICD-10-CM

## 2021-12-06 MED ORDER — OFLOXACIN 0.3 % OP SOLN
1.0000 [drp] | Freq: Four times a day (QID) | OPHTHALMIC | 0 refills | Status: AC
Start: 2021-12-06 — End: ?

## 2021-12-06 NOTE — ED Triage Notes (Addendum)
Pt reports eye swelling x4 days, stated it felt like gravel inside of it. Pt stated the left eye has blurry vision. Pt stated she has to put a warm rag on it every morning she wakes up. It started being itchy today. Pt reports some drainage in the morning, stated the drainage looks "Off white" ,stated it is glued shut in the morning. Pt has tried visine and warm compressions to help but has not helped.

## 2021-12-06 NOTE — Discharge Instructions (Addendum)
Started Ofloxacin 0.3% four time as day until symptoms resolve Encourage use of antihistamine (benadryl,, allegra etc.) at night to decrease or prevent eye itch.  Establish care with opthalmology for a complete eye exam.

## 2021-12-06 NOTE — ED Provider Notes (Signed)
[place]    CSN: [id number] Arrival date & time: 12/06/21  1258      History   Chief Complaint Chief Complaint  Patient presents with   Facial Swelling    HPI Kimberly Jefferson is a 56 y.o. female.   HPI  Patient presents for evaluation of redness, foreign body sensation, and itchiness to the left eye . Onset of symptoms was abrupt starting 3 days ago, with gradually worsening since that time. There is a discharge present. There is not a history trauma to the eye. There is not a history of foreign body getting into eye. The patient is not a contact lens wearer. She reports using an old mascara when she ran out of her current one on Saturday. Her symptom started on Sunday. Denies fever, chills, headache, dizziness. She feels like she may need to be seen by the eye doctor for a vision exam. She has been wearing readers prn but it seem to be more often.   Past Medical History:  Diagnosis Date   Anxiety    no meds   Dysmenorrhea    Fibroids    Heart murmur    "nothing to worry about" dx child, no problems ever   History of blood transfusion  2013   heavy periods, MC 2 units transfuesd   HTN (hypertension) 03/12/2012   one time no med   Hypertension    one time   Pneumonia     hx "had it once a year for a few years"  last time was 3- 4 years ago.   SVD (spontaneous vaginal delivery)    x 2    Patient Active Problem List   Diagnosis Date Noted   S/P hysterectomy 06/16/2019   ASCUS with positive high risk HPV cervical 06/10/2019   Symptomatic anemia 05/12/2019   Iron deficiency anemia due to chronic blood loss 05/12/2019   Abnormal uterine bleeding (AUB) 05/12/2019   Vitamin B12 deficiency anemia 03/22/2016   Heavy menstrual bleeding 03/22/2016   URI (upper respiratory infection) 03/22/2016   Folate deficiency 03/22/2016   Uterine fibroid 03/22/2016   Increased endometrial stripe 03/22/2016   Ankle fracture, right 06/18/2012   Hypokalemia 03/13/2012    Chest pain 03/12/2012   HTN (hypertension) 03/12/2012   Trichomonas 03/12/2012   Left sided numbness 03/11/2012   Syncope 03/11/2012   Anemia 03/11/2012    Past Surgical History:  Procedure Laterality Date   ABDOMINAL HYSTERECTOMY  06/16/2019   CESAREAN SECTION  1988   x 1   CHOLECYSTECTOMY  1990   HYSTERECTOMY ABDOMINAL WITH SALPINGECTOMY Bilateral 06/16/2019   Procedure: HYSTERECTOMY ABDOMINAL WITH SALPINGECTOMY;  Surgeon: Mora Bellman, MD;  Location: [facility];  Service: Gynecology;  Laterality: Bilateral;  tap block per MD   ORIF ANKLE FRACTURE Right 06/18/2012   Procedure: OPEN REDUCTION INTERNAL FIXATION (ORIF) ANKLE FRACTURE;  Surgeon: Augustin Schooling, MD;  Location: [facility];  Service: Orthopedics;  Laterality: Right;   TUBAL LIGATION  1990    OB History   No obstetric history on file.      Home Medications    Prior to Admission medications   Medication Sig Start Date End Date Taking? Authorizing Provider  ofloxacin (OCUFLOX) 0.3 % ophthalmic solution Place 1 drop into both eyes 4 (four) times daily. 12/06/21  Yes Vevelyn Francois, NP  docusate sodium (COLACE) 100 MG capsule Take 1 capsule (100 mg total) by mouth 2 (two) times daily. 06/18/19   Constant, Vickii Chafe, MD  hydrochlorothiazide (  HYDRODIURIL) 25 MG tablet Take 1 tablet (25 mg total) by mouth daily. 05/15/21   Barrett Henle, MD  ibuprofen (ADVIL) 800 MG tablet Take 1 tablet (800 mg total) by mouth every 8 (eight) hours as needed (pain). 05/15/21   Barrett Henle, MD  meclizine (ANTIVERT) 25 MG tablet Take 1 tablet (25 mg total) by mouth 3 (three) times daily as needed for dizziness. 05/12/39   Delora Fuel, MD  metoCLOPramide (REGLAN) 10 MG tablet Take 1 tablet (10 mg total) by mouth every 6 (six) hours as needed for nausea (nausea/headache). 03/11/21   Drenda Freeze, MD  naproxen (NAPROSYN) 500 MG tablet Take 1 tablet (500 mg total) by mouth 2 (two) times daily. 03/31/21   Marney Setting, NP  ondansetron  (ZOFRAN) 4 MG tablet Take 1 tablet (4 mg total) by mouth every 8 (eight) hours as needed for nausea or vomiting. 7/40/81   Delora Fuel, MD  tiZANidine (ZANAFLEX) 4 MG tablet Take 1 tablet (4 mg total) by mouth every 8 (eight) hours as needed for muscle spasms. 05/15/21   Barrett Henle, MD  albuterol (PROVENTIL HFA;VENTOLIN HFA) 108 (90 Base) MCG/ACT inhaler Inhale 2 puffs into the lungs every 4 (four) hours as needed for wheezing or shortness of breath. Patient not taking: Reported on 01/30/2018 12/17/17 10/17/18  Carlisle Cater, PA-C    Family History Family History  Problem Relation Age of Onset   Healthy Mother    Healthy Father    Breast cancer Maternal Aunt     Social History Social History   Tobacco Use   Smoking status: Some Days    Packs/day: 0.50    Years: 20.00    Total pack years: 10.00    Types: Cigarettes   Smokeless tobacco: Never   Tobacco comments:    per patient currently down to 1 cigarette/week  noted on 06/12/2019  Vaping Use   Vaping Use: Never used  Substance Use Topics   Alcohol use: No   Drug use: No     Allergies   Bee venom and Doxycycline   Review of Systems Review of Systems   Physical Exam Triage Vital Signs ED Triage Vitals  Enc Vitals Group     BP      Pulse      Resp      Temp      Temp src      SpO2      Weight      Height      Head Circumference      Peak Flow      Pain Score      Pain Loc      Pain Edu?      Excl. in [place]?    No data found.  Updated Vital Signs BP (!) 152/92 (BP Location: Left Arm)   Pulse 70   Temp 98.7 F (37.1 C) (Oral)   Resp 18   LMP 05/10/2019 (Exact Date)   SpO2 98%   Visual Acuity Right Eye Distance:   Left Eye Distance:   Bilateral Distance:    Right Eye Near:   Left Eye Near:    Bilateral Near:     Physical Exam Constitutional:      General: She is not in acute distress.    Appearance: She is not ill-appearing, toxic-appearing or diaphoretic.  HENT:     Head:  Normocephalic and atraumatic.     Nose: Nose normal.     Mouth/Throat:  Mouth: Mucous membranes are moist.  Eyes:     Extraocular Movements: Extraocular movements intact.      Comments: Swelling and erythema   Neurological:     Mental Status: She is alert.      UC Treatments / Results  Labs (all labs ordered are listed, but only abnormal results are displayed) Labs Reviewed - No data to display  EKG   Radiology No results found.  Procedures Procedures (including critical care time)  Medications Ordered in UC Medications - No data to display  Initial Impression / Assessment and Plan / UC Course  I have reviewed the triage vital signs and the nursing notes.  Pertinent labs & imaging results that were available during my care of the patient were reviewed by me and considered in my medical decision making (see chart for details).     conjunctivitis  Final Clinical Impressions(s) / UC Diagnoses   Final diagnoses:  Acute conjunctivitis of left eye, unspecified acute conjunctivitis type     Discharge Instructions      Started Ofloxacin 0.3% four time as day until symptoms resolve Encourage use of antihistamine (benadryl,, allegra etc.) at night to decrease or prevent eye itch.  Establish care with opthalmology for a complete eye exam.      ED Prescriptions     Medication Sig Dispense Auth. Provider   ofloxacin (OCUFLOX) 0.3 % ophthalmic solution Place 1 drop into both eyes 4 (four) times daily. 5 mL Vevelyn Francois, NP      PDMP not reviewed this encounter.   Dionisio David [place], NP 12/06/21 1450

## 2022-11-17 ENCOUNTER — Emergency Department (HOSPITAL_COMMUNITY): Payer: Self-pay

## 2022-11-17 ENCOUNTER — Encounter (HOSPITAL_COMMUNITY): Payer: Self-pay | Admitting: Emergency Medicine

## 2022-11-17 ENCOUNTER — Emergency Department (HOSPITAL_COMMUNITY)
Admission: EM | Admit: 2022-11-17 | Discharge: 2022-11-17 | Disposition: A | Discharge status: Home / Self Care | Payer: Self-pay | Attending: Emergency Medicine | Admitting: Emergency Medicine

## 2022-11-17 ENCOUNTER — Ambulatory Visit (HOSPITAL_COMMUNITY)
Admission: EM | Admit: 2022-11-17 | Discharge: 2022-11-17 | Disposition: A | Discharge status: Home / Self Care | Payer: Self-pay | Attending: Family Medicine | Admitting: Family Medicine

## 2022-11-17 ENCOUNTER — Other Ambulatory Visit: Payer: Self-pay

## 2022-11-17 DIAGNOSIS — M542 Cervicalgia: Secondary | ICD-10-CM

## 2022-11-17 DIAGNOSIS — Z91148 Patient's other noncompliance with medication regimen for other reason: Secondary | ICD-10-CM | POA: Insufficient documentation

## 2022-11-17 DIAGNOSIS — I10 Essential (primary) hypertension: Secondary | ICD-10-CM

## 2022-11-17 DIAGNOSIS — R6884 Jaw pain: Secondary | ICD-10-CM

## 2022-11-17 DIAGNOSIS — R519 Headache, unspecified: Secondary | ICD-10-CM | POA: Insufficient documentation

## 2022-11-17 LAB — CBC WITH DIFFERENTIAL/PLATELET
Abs Immature Granulocytes: 0.04 10*3/uL (ref 0.00–0.07)
Basophils Absolute: 0.1 10*3/uL (ref 0.0–0.1)
Basophils Relative: 1 %
Eosinophils Absolute: 0.2 10*3/uL (ref 0.0–0.5)
Eosinophils Relative: 1 %
HCT: 45.6 % (ref 36.0–46.0)
Hemoglobin: 15.7 g/dL — ABNORMAL HIGH (ref 12.0–15.0)
Immature Granulocytes: 0 %
Lymphocytes Relative: 31 %
Lymphs Abs: 3.3 10*3/uL (ref 0.7–4.0)
MCH: 30.6 pg (ref 26.0–34.0)
MCHC: 34.4 g/dL (ref 30.0–36.0)
MCV: 88.9 fL (ref 80.0–100.0)
Monocytes Absolute: 0.7 10*3/uL (ref 0.1–1.0)
Monocytes Relative: 7 %
Neutro Abs: 6.6 10*3/uL (ref 1.7–7.7)
Neutrophils Relative %: 60 %
Platelets: 212 10*3/uL (ref 150–400)
RBC: 5.13 MIL/uL — ABNORMAL HIGH (ref 3.87–5.11)
RDW: 13.4 % (ref 11.5–15.5)
WBC: 10.9 10*3/uL — ABNORMAL HIGH (ref 4.0–10.5)
nRBC: 0 % (ref 0.0–0.2)

## 2022-11-17 LAB — I-STAT CHEM 8, ED
BUN: 11 mg/dL (ref 6–20)
Calcium, Ion: 1.09 mmol/L — ABNORMAL LOW (ref 1.15–1.40)
Chloride: 102 mmol/L (ref 98–111)
Creatinine, Ser: 0.9 mg/dL (ref 0.44–1.00)
Glucose, Bld: 97 mg/dL (ref 70–99)
HCT: 46 % (ref 36.0–46.0)
Hemoglobin: 15.6 g/dL — ABNORMAL HIGH (ref 12.0–15.0)
Potassium: 3.9 mmol/L (ref 3.5–5.1)
Sodium: 140 mmol/L (ref 135–145)
TCO2: 27 mmol/L (ref 22–32)

## 2022-11-17 LAB — COMPREHENSIVE METABOLIC PANEL
ALT: 28 U/L (ref 0–44)
AST: 22 U/L (ref 15–41)
Albumin: 4.2 g/dL (ref 3.5–5.0)
Alkaline Phosphatase: 87 U/L (ref 38–126)
Anion gap: 13 (ref 5–15)
BUN: 10 mg/dL (ref 6–20)
CO2: 25 mmol/L (ref 22–32)
Calcium: 9.3 mg/dL (ref 8.9–10.3)
Chloride: 100 mmol/L (ref 98–111)
Creatinine, Ser: 0.88 mg/dL (ref 0.44–1.00)
GFR, Estimated: >60 mL/min (ref >60)
Glucose, Bld: 100 mg/dL — ABNORMAL HIGH (ref 70–99)
Potassium: 3.8 mmol/L (ref 3.5–5.1)
Sodium: 138 mmol/L (ref 135–145)
Total Bilirubin: 1.1 mg/dL (ref 0.3–1.2)
Total Protein: 6.5 g/dL (ref 6.5–8.1)

## 2022-11-17 LAB — MAGNESIUM: Magnesium: 2.2 mg/dL (ref 1.7–2.4)

## 2022-11-17 LAB — TROPONIN I (HIGH SENSITIVITY)
Troponin I (High Sensitivity): 5 ng/L (ref <18)
Troponin I (High Sensitivity): 6 ng/L (ref <18)

## 2022-11-17 LAB — SEDIMENTATION RATE: Sed Rate: 2 mm/hr (ref 0–22)

## 2022-11-17 LAB — C-REACTIVE PROTEIN: CRP: 0.6 mg/dL (ref <1.0)

## 2022-11-17 LAB — CK: Total CK: 166 U/L (ref 38–234)

## 2022-11-17 MED ORDER — HYDRALAZINE HCL 20 MG/ML IJ SOLN
5.0000 mg | Freq: Once | INTRAMUSCULAR | Status: AC
  Administered 2022-11-17: 5 mg via INTRAVENOUS
  Filled 2022-11-17: qty 1

## 2022-11-17 MED ORDER — PROMETHAZINE HCL 25 MG PO TABS: 25.0000 mg | ORAL_TABLET | Freq: Four times a day (QID) | ORAL | 0 refills | Status: DC | PRN

## 2022-11-17 MED ORDER — SODIUM CHLORIDE 0.9 % IV BOLUS
1000.0000 mL | Freq: Once | INTRAVENOUS | Status: AC
  Administered 2022-11-17: 1000 mL via INTRAVENOUS

## 2022-11-17 MED ORDER — KETOROLAC TROMETHAMINE 15 MG/ML IJ SOLN
15.0000 mg | Freq: Once | INTRAMUSCULAR | Status: AC
  Administered 2022-11-17: 15 mg via INTRAVENOUS
  Filled 2022-11-17: qty 1

## 2022-11-17 MED ORDER — HYDROMORPHONE HCL 1 MG/ML IJ SOLN
0.5000 mg | Freq: Once | INTRAMUSCULAR | Status: AC
  Administered 2022-11-17: 0.5 mg via INTRAVENOUS
  Filled 2022-11-17: qty 1

## 2022-11-17 MED ORDER — MORPHINE SULFATE (PF) 2 MG/ML IV SOLN
INTRAVENOUS | Status: AC
  Filled 2022-11-17: qty 2

## 2022-11-17 MED ORDER — MORPHINE SULFATE (PF) 2 MG/ML IV SOLN
4.0000 mg | Freq: Once | INTRAVENOUS | Status: AC
  Administered 2022-11-17: 4 mg via INTRAVENOUS

## 2022-11-17 MED ORDER — ACETAMINOPHEN 500 MG PO TABS
1000.0000 mg | ORAL_TABLET | Freq: Once | ORAL | Status: AC
  Administered 2022-11-17: 1000 mg via ORAL
  Filled 2022-11-17: qty 2

## 2022-11-17 MED ORDER — SODIUM CHLORIDE 0.9 % IV SOLN: Freq: Once | INTRAVENOUS | Status: AC

## 2022-11-17 MED ORDER — DEXAMETHASONE SODIUM PHOSPHATE 10 MG/ML IJ SOLN
10.0000 mg | Freq: Once | INTRAMUSCULAR | Status: AC
  Administered 2022-11-17: 10 mg
  Filled 2022-11-17: qty 1

## 2022-11-17 MED ORDER — IOHEXOL 350 MG/ML SOLN
60.0000 mL | Freq: Once | INTRAVENOUS | Status: AC | PRN
  Administered 2022-11-17: 60 mL via INTRAVENOUS

## 2022-11-17 MED ORDER — ONDANSETRON HCL 4 MG/2ML IJ SOLN
4.0000 mg | Freq: Once | INTRAMUSCULAR | Status: AC
  Administered 2022-11-17: 4 mg via INTRAVENOUS
  Filled 2022-11-17: qty 2

## 2022-11-17 NOTE — ED Provider Notes (Signed)
[facility]   [id number] 11/17/22 Arrival Time: 1550  ASSESSMENT & PLAN:  1. Neck pain on left side   2. Jaw pain   3. Uncontrolled hypertension    Poor historian; keeps moaning regarding LEFT neck and jaw pain; questions chest also "but mostly my neck". Without gross neurological abnormalities. Speech is clear. Moving extremities normally. Also mentions bilateral hand numbness but her husband affirms this is nothing new.  ECG: Performed today and interpreted by me: sinus; T-wave inversions in III and aVF, similar on one ECG in chart but not as pronounced..  BP (!) 200/114 (BP Location: Right Arm)   Pulse 78   Resp (!) 30   LMP 05/10/2019 (Exact Date)   SpO2 98%   BP very high. Tachypnea noted. Clear breath sounds bilaterally. Unclear cause of symptoms.  To ED via [facility]. Stable upon discharge.  Meds ordered this encounter  Medications   morphine (PF) 2 MG/ML injection 4 mg   Reviewed expectations re: course of current medical issues. Questions answered. Outlined signs and symptoms indicating need for more acute intervention. Patient verbalized understanding. After Visit Summary given.   SUBJECTIVE: History from: patient and husband . Kimberly Jefferson is a 57 y.o. female who was her normal self until approx 3 hours ago; called her husband presents with complaint of left neck and jaw pain; persistent. Apparently went home for a hour then came here. Still complaining of left neck and jaw pain. Crying and grabbing at her neck. Denies current CP/SOB. Denies recreational drug use.  Social History   Tobacco Use  Smoking Status Some Days   Current packs/day: 0.50   Average packs/day: 0.5 packs/day for 20.0 years (10.0 ttl pk-yrs)   Types: Cigarettes  Smokeless Tobacco Never  Tobacco Comments   per patient currently down to 1 cigarette/week  noted on 06/12/2019   Social History   Substance and Sexual Activity  Alcohol Use No     OBJECTIVE:  Vitals:    11/17/22 1604  BP: (!) 200/114  Pulse: 78  Resp: (!) 30  SpO2: 98%    General appearance: appears to be in pain; grabbing at neck Eyes: PERRLA; EOMI; conjunctivae normal HENT: normocephalic; atraumatic Neck: supple with FROM but she complains of left sided neck TTP; denies midline TTP Lungs: with tachypnea;; speaks full sentences without difficulty; CTAB Heart: regular Chest Wall: denies tenderness to palpation Abdomen: soft Extremities: without edema; without calf swelling or tenderness; symmetrical without gross deformities Skin: warm and dry; without rash or lesions Neuro: normal gait Psychological: alert and cooperative; normal mood and affect   Allergies  Allergen Reactions   Bee Venom Anaphylaxis   Doxycycline Nausea Only    Past Medical History:  Diagnosis Date   Anxiety    no meds   Dysmenorrhea    Fibroids    Heart murmur    "nothing to worry about" dx child, no problems ever   History of blood transfusion  2013   heavy periods, [facility] 2 units transfuesd   HTN (hypertension) 03/12/2012   one time no med   Hypertension    one time   Pneumonia     hx "had it once a year for a few years"  last time was 3- 4 years ago.   SVD (spontaneous vaginal delivery)    x 2   Social History   Socioeconomic History   Marital status: Married    Spouse name: Not on file   Number of children: Not on file  Years of education: Not on file   Highest education level: Not on file  Occupational History   Not on file  Tobacco Use   Smoking status: Some Days    Current packs/day: 0.50    Average packs/day: 0.5 packs/day for 20.0 years (10.0 ttl pk-yrs)    Types: Cigarettes   Smokeless tobacco: Never   Tobacco comments:    per patient currently down to 1 cigarette/week  noted on 06/12/2019  Vaping Use   Vaping status: Never Used  Substance and Sexual Activity   Alcohol use: No   Drug use: No   Sexual activity: Yes    Birth control/protection: Surgical  Other Topics  Concern   Not on file  Social History Narrative   Not on file   Social Determinants of Health   Financial Resource Strain: Not on file  Food Insecurity: Not on file  Transportation Needs: Not on file  Physical Activity: Not on file  Stress: Not on file  Social Connections: Unknown (09/05/2021)   Received from [facility]   Social Network    Social Network: Not on file  Intimate Partner Violence: Unknown (07/28/2021)   Received from [facility]   HITS    Physically Hurt: Not on file    Insult or Talk Down To: Not on file    Threaten Physical Harm: Not on file    Scream or Curse: Not on file   Family History  Problem Relation Age of Onset   Healthy Mother    Healthy Father    Breast cancer Maternal Aunt    Past Surgical History:  Procedure Laterality Date   ABDOMINAL HYSTERECTOMY  06/16/2019   CESAREAN SECTION  1988   x 1   CHOLECYSTECTOMY  1990   HYSTERECTOMY ABDOMINAL WITH SALPINGECTOMY Bilateral 06/16/2019   Procedure: HYSTERECTOMY ABDOMINAL WITH SALPINGECTOMY;  Surgeon: Catalina Antigua, MD;  Location: [facility] OR;  Service: Gynecology;  Laterality: Bilateral;  tap block per MD   ORIF ANKLE FRACTURE Right 06/18/2012   Procedure: OPEN REDUCTION INTERNAL FIXATION (ORIF) ANKLE FRACTURE;  Surgeon: Verlee Rossetti, MD;  Location: [facility] OR;  Service: Orthopedics;  Laterality: Right;   TUBAL LIGATION  1990      Mardella Layman, MD 11/17/22 1620

## 2022-11-17 NOTE — ED Notes (Signed)
CBG 88 

## 2022-11-17 NOTE — ED Provider Notes (Signed)
[place] EMERGENCY DEPARTMENT AT [facility] Provider Note  CSN: [id number] Arrival date & time: 11/17/22 0865  Chief Complaint(s) Abnormal ECG  HPI Kimberly Jefferson is a 57 y.o. female with past medical history as below, significant for HTN, anxiety who presents to the ED with complaint of neck pain/abnormal EKG for UC. Pt reports onset of neck pain around 3 hours PTA while on he way to see a movie. Pain from left temporal area down to left shoulder, sharp/stabbing. Intermittent tingling to both her arms. Pain worsened, denies similar pain but has reported intermittent tingling to her arms over the past 2 months. No dyspnea or palpitations, had near syncope with onset of pain because she felt the pain was so severe it made her feel like she was going to pass out. Does not take her anti-hypertensive anymore.   Past Medical History Past Medical History:  Diagnosis Date   Anxiety    no meds   Dysmenorrhea    Fibroids    Heart murmur    "nothing to worry about" dx child, no problems ever   History of blood transfusion  2013   heavy periods, [facility] 2 units transfuesd   HTN (hypertension) 03/12/2012   one time no med   Hypertension    one time   Pneumonia     hx "had it once a year for a few years"  last time was 3- 4 years ago.   SVD (spontaneous vaginal delivery)    x 2   Patient Active Problem List   Diagnosis Date Noted   S/P hysterectomy 06/16/2019   ASCUS with positive high risk HPV cervical 06/10/2019   Symptomatic anemia 05/12/2019   Iron deficiency anemia due to chronic blood loss 05/12/2019   Abnormal uterine bleeding (AUB) 05/12/2019   Vitamin B12 deficiency anemia 03/22/2016   Heavy menstrual bleeding 03/22/2016   URI (upper respiratory infection) 03/22/2016   Folate deficiency 03/22/2016   Uterine fibroid 03/22/2016   Increased endometrial stripe 03/22/2016   Ankle fracture, right 06/18/2012   Hypokalemia 03/13/2012   Chest pain 03/12/2012   HTN  (hypertension) 03/12/2012   Trichomonas 03/12/2012   Left sided numbness 03/11/2012   Syncope 03/11/2012   Anemia 03/11/2012   Home Medication(s) Prior to Admission medications   Medication Sig Start Date End Date Taking? Authorizing Provider  hydrochlorothiazide (HYDRODIURIL) 25 MG tablet Take 1 tablet (25 mg total) by mouth daily. 11/18/22 12/18/22 Yes Sloan Leiter, DO  promethazine (PHENERGAN) 25 MG tablet Take 1 tablet (25 mg total) by mouth every 6 (six) hours as needed (headache). 11/17/22  Yes Tanda Rockers A, DO  docusate sodium (COLACE) 100 MG capsule Take 1 capsule (100 mg total) by mouth 2 (two) times daily. 06/18/19   Constant, Peggy, MD  ibuprofen (ADVIL) 800 MG tablet Take 1 tablet (800 mg total) by mouth every 8 (eight) hours as needed (pain). 05/15/21   Zenia Resides, MD  meclizine (ANTIVERT) 25 MG tablet Take 1 tablet (25 mg total) by mouth 3 (three) times daily as needed for dizziness. 01/06/20   Dione Booze, MD  metoCLOPramide (REGLAN) 10 MG tablet Take 1 tablet (10 mg total) by mouth every 6 (six) hours as needed for nausea (nausea/headache). 03/11/21   Charlynne Pander, MD  naproxen (NAPROSYN) 500 MG tablet Take 1 tablet (500 mg total) by mouth 2 (two) times daily. 03/31/21   Coralyn Mark, NP  ofloxacin (OCUFLOX) 0.3 % ophthalmic solution Place 1 drop into both  eyes 4 (four) times daily. 12/06/21   Barbette Merino, NP  ondansetron (ZOFRAN) 4 MG tablet Take 1 tablet (4 mg total) by mouth every 8 (eight) hours as needed for nausea or vomiting. 01/06/20   Dione Booze, MD  tiZANidine (ZANAFLEX) 4 MG tablet Take 1 tablet (4 mg total) by mouth every 8 (eight) hours as needed for muscle spasms. 05/15/21   Zenia Resides, MD  albuterol (PROVENTIL HFA;VENTOLIN HFA) 108 (90 Base) MCG/ACT inhaler Inhale 2 puffs into the lungs every 4 (four) hours as needed for wheezing or shortness of breath. Patient not taking: Reported on 01/30/2018 12/17/17 10/17/18  Renne Crigler, PA-C                                                                                                                                     Past Surgical History Past Surgical History:  Procedure Laterality Date   ABDOMINAL HYSTERECTOMY  06/16/2019   CESAREAN SECTION  1988   x 1   CHOLECYSTECTOMY  1990   HYSTERECTOMY ABDOMINAL WITH SALPINGECTOMY Bilateral 06/16/2019   Procedure: HYSTERECTOMY ABDOMINAL WITH SALPINGECTOMY;  Surgeon: Catalina Antigua, MD;  Location: [facility] OR;  Service: Gynecology;  Laterality: Bilateral;  tap block per MD   ORIF ANKLE FRACTURE Right 06/18/2012   Procedure: OPEN REDUCTION INTERNAL FIXATION (ORIF) ANKLE FRACTURE;  Surgeon: Verlee Rossetti, MD;  Location: [facility] OR;  Service: Orthopedics;  Laterality: Right;   TUBAL LIGATION  1990   Family History Family History  Problem Relation Age of Onset   Healthy Mother    Healthy Father    Breast cancer Maternal Aunt     Social History Social History   Tobacco Use   Smoking status: Some Days    Current packs/day: 0.50    Average packs/day: 0.5 packs/day for 20.0 years (10.0 ttl pk-yrs)    Types: Cigarettes   Smokeless tobacco: Never   Tobacco comments:    per patient currently down to 1 cigarette/week  noted on 06/12/2019  Vaping Use   Vaping status: Never Used  Substance Use Topics   Alcohol use: No   Drug use: No   Allergies Bee venom and Doxycycline  Review of Systems Review of Systems  Constitutional:  Negative for chills and fever.  HENT:  Negative for facial swelling and trouble swallowing.   Eyes:  Negative for photophobia and visual disturbance.  Respiratory:  Negative for cough and shortness of breath.   Cardiovascular:  Negative for chest pain and palpitations.  Gastrointestinal:  Negative for abdominal pain, nausea and vomiting.  Endocrine: Negative for polydipsia and polyuria.  Genitourinary:  Negative for difficulty urinating and hematuria.  Musculoskeletal:  Positive for neck pain. Negative for gait  problem and joint swelling.  Skin:  Negative for pallor and rash.  Neurological:  Positive for headaches. Negative for syncope.  Psychiatric/Behavioral:  Negative for agitation and confusion.     Physical Exam Vital  Signs  I have reviewed the triage vital signs BP (!) 148/79 (BP Location: Left Arm)   Pulse 68   Temp 98 F (36.7 C)   Resp 18   LMP 05/10/2019 (Exact Date)   SpO2 100%  Physical Exam Vitals and nursing note reviewed.  Constitutional:      General: She is not in acute distress.    Appearance: Normal appearance.  HENT:     Head: Normocephalic and atraumatic.     Right Ear: External ear normal.     Left Ear: External ear normal.     Nose: Nose normal.     Mouth/Throat:     Mouth: Mucous membranes are moist.  Eyes:     General: No scleral icterus.       Right eye: No discharge.        Left eye: No discharge.     Extraocular Movements: Extraocular movements intact.     Pupils: Pupils are equal, round, and reactive to light.  Cardiovascular:     Rate and Rhythm: Normal rate and regular rhythm.     Pulses: Normal pulses.     Heart sounds: Normal heart sounds.  Pulmonary:     Effort: Pulmonary effort is normal. No respiratory distress.     Breath sounds: Normal breath sounds. No stridor.  Abdominal:     General: Abdomen is flat. There is no distension.     Palpations: Abdomen is soft.     Tenderness: There is no abdominal tenderness.  Musculoskeletal:     Cervical back: Normal range of motion. No rigidity.     Right lower leg: No edema.     Left lower leg: No edema.  Skin:    General: Skin is warm and dry.     Capillary Refill: Capillary refill takes less than 2 seconds.  Neurological:     Mental Status: She is alert and oriented to person, place, and time.     GCS: GCS eye subscore is 4. GCS verbal subscore is 5. GCS motor subscore is 6.     Cranial Nerves: Cranial nerves 2-12 are intact. No dysarthria.     Sensory: Sensation is intact.     Motor: Motor  function is intact. No weakness.     Coordination: Coordination is intact. Finger-Nose-Finger Test normal.     Gait: Gait is intact.     Comments: Strength 5/5 BLUE BLLE  Psychiatric:        Mood and Affect: Mood normal.        Behavior: Behavior normal. Behavior is cooperative.     ED Results and Treatments Labs (all labs ordered are listed, but only abnormal results are displayed) Labs Reviewed  CBC WITH DIFFERENTIAL/PLATELET - Abnormal; Notable for the following components:      Result Value   WBC 10.9 (*)    RBC 5.13 (*)    Hemoglobin 15.7 (*)    All other components within normal limits  COMPREHENSIVE METABOLIC PANEL - Abnormal; Notable for the following components:   Glucose, Bld 100 (*)    All other components within normal limits  I-STAT CHEM 8, ED - Abnormal; Notable for the following components:   Calcium, Ion 1.09 (*)    Hemoglobin 15.6 (*)    All other components within normal limits  CK  MAGNESIUM  SEDIMENTATION RATE  C-REACTIVE PROTEIN  TROPONIN I (HIGH SENSITIVITY)  TROPONIN I (HIGH SENSITIVITY)  Radiology CT ANGIO HEAD NECK W WO CM  Result Date: 11/17/2022 CLINICAL DATA:  Left-sided neck pain. EXAM: CT ANGIOGRAPHY HEAD AND NECK WITH AND WITHOUT CONTRAST TECHNIQUE: Multidetector CT imaging of the head and neck was performed using the standard protocol during bolus administration of intravenous contrast. Multiplanar CT image reconstructions and MIPs were obtained to evaluate the vascular anatomy. Carotid stenosis measurements (when applicable) are obtained utilizing NASCET criteria, using the distal internal carotid diameter as the denominator. RADIATION DOSE REDUCTION: This exam was performed according to the departmental dose-optimization program which includes automated exposure control, adjustment of the mA and/or kV according to patient size  and/or use of iterative reconstruction technique. CONTRAST:  60mL OMNIPAQUE IOHEXOL 350 MG/ML SOLN COMPARISON:  CT angio head and neck 03/11/2021 FINDINGS: CT HEAD FINDINGS Brain: No acute infarct, hemorrhage, or mass lesion is present. No significant white matter lesions are present. Deep brain nuclei are within normal limits. The ventricles are of normal size. No significant extraaxial fluid collection is present. The brainstem and cerebellum are within normal limits. Midline structures are within normal limits. Vascular: No hyperdense vessel or unexpected calcification. Skull: Calvarium is intact. No focal lytic or blastic lesions are present. No significant extracranial soft tissue lesion is present. Sinuses/Orbits: The paranasal sinuses and mastoid air cells are clear. The globes and orbits are within normal limits. Review of the MIP images confirms the above findings CTA NECK FINDINGS Aortic arch: Common origin of the left common carotid artery and innominate artery noted. Aortic arch is within normal limits. No significant stenosis or aneurysm is present. Right carotid system: The right common carotid artery is within normal limits. Minimal calcification is present at the right carotid bifurcation. No significant stenosis is present. The cervical right ICA is normal. Left carotid system: The left common carotid artery is within normal limits. The bifurcation is unremarkable. Cervical left ICA is normal. Vertebral arteries: The left vertebral artery is dominant to the right. Both vertebral arteries originate from the subclavian arteries. No significant stenosis is present in either vertebral artery in the neck. Skeleton: Vertebral body heights and alignment are normal. Straightening of the normal cervical lordosis may be positional. Other neck: Soft tissues the neck are otherwise unremarkable. Salivary glands are within normal limits. Thyroid is normal. No significant adenopathy is present. No focal mucosal or  submucosal lesions are present. Upper chest: Centrilobular emphysematous changes are present. Mild dependent atelectasis is present. Lung apices are otherwise clear. The thoracic inlet is within normal limits. Review of the MIP images confirms the above findings CTA HEAD FINDINGS Anterior circulation: The internal carotid arteries are within normal limits from the skull base to the ICA termini. The A1 and M1 segments are normal. The anterior communicating artery is patent. MCA bifurcations are within normal limits. The ACA and MCA branch vessels are normal. No aneurysm is present. Posterior circulation: PICA origins are visualized and normal. The vertebrobasilar junction and basilar artery are normal. The superior cerebellar arteries are patent. Both posterior cerebral arteries originate from basilar tip. PCA branch vessels are normal bilaterally. Venous sinuses: The dural sinuses are patent. The straight sinus and deep cerebral veins are intact. Cortical veins are within normal limits. No significant vascular malformation is evident. Anatomic variants: None Review of the MIP images confirms the above findings IMPRESSION: 1. Minimal calcification at the right carotid bifurcation without significant stenosis. 2. Otherwise normal CTA of the neck. 3. Normal CTA circle-of-Willis without significant proximal stenosis, aneurysm, or branch vessel occlusion. 4. Normal noncontrast  CT of the head. 5.  Emphysema (ICD10-J43.9). Electronically Signed   By: Marin Roberts M.D.   On: 11/17/2022 18:20   DG Chest Portable 1 View  Result Date: 11/17/2022 CLINICAL DATA:  LEFT-sided pain EXAM: PORTABLE CHEST 1 VIEW COMPARISON:  March 11, 2021 FINDINGS: The cardiomediastinal silhouette is unchanged in contour.Tortuous thoracic aorta. No pleural effusion. No pneumothorax. No acute pleuroparenchymal abnormality. IMPRESSION: No acute cardiopulmonary abnormality. Electronically Signed   By: Meda Klinefelter M.D.   On:  11/17/2022 17:14    Pertinent labs & imaging results that were available during my care of the patient were reviewed by me and considered in my medical decision making (see MDM for details).  Medications Ordered in ED Medications  sodium chloride 0.9 % bolus 1,000 mL (0 mLs Intravenous Stopped 11/17/22 1904)  HYDROmorphone (DILAUDID) injection 0.5 mg (0.5 mg Intravenous Given 11/17/22 1703)  hydrALAZINE (APRESOLINE) injection 5 mg (5 mg Intravenous Given 11/17/22 1703)  ondansetron (ZOFRAN) injection 4 mg (4 mg Intravenous Given 11/17/22 1711)  iohexol (OMNIPAQUE) 350 MG/ML injection 60 mL (60 mLs Intravenous Contrast Given 11/17/22 1800)  ketorolac (TORADOL) 15 MG/ML injection 15 mg (15 mg Intravenous Given 11/17/22 1903)  hydrALAZINE (APRESOLINE) injection 5 mg (5 mg Intravenous Given 11/17/22 1931)  dexamethasone (DECADRON) injection 10 mg (10 mg Other Given 11/17/22 2129)  acetaminophen (TYLENOL) tablet 1,000 mg (1,000 mg Oral Given 11/17/22 2129)                                                                                                                                     Procedures Procedures  (including critical care time)  Medical Decision Making / ED Course    Medical Decision Making:    Anielka Kaminskas is a 58 y.o. female with past medical history as below, significant for HTN, anxiety who presents to the ED with complaint of neck pain/abnormal EKG for UC. The complaint involves an extensive differential diagnosis and also carries with it a high risk of complications and morbidity.  Serious etiology was considered. Ddx includes but is not limited to: Differential diagnosis includes but is not exclusive to subarachnoid hemorrhage, meningitis, encephalitis, previous head trauma, cavernous venous thrombosis, muscle tension headache, glaucoma, temporal arteritis, migraine or migraine equivalent, etc.   Complete initial physical exam performed, notably the patient  was ongoing pain,  neuro non-focal, HDS.    Reviewed and confirmed nursing documentation for past medical history, family history, social history.  Vital signs reviewed.    Clinical Course as of 11/18/22 0015  Sat Nov 17, 2022  1701 EKG similar to prior  [SG]  1917 Imaging stable [SG]  1948 Feeling much better  [SG]  2242 Symptoms resolved, neuro non-focal [SG]    Clinical Course User Index [SG] Sloan Leiter, DO    Patient presents with headache. Based on the patient's history and physical there is very low clinical suspicion for significant intracranial  pathology. The headache was not sudden onset, not maximal at onset, there are no neurologic findings on exam, the patient does not have a fever, the patient does not have any jaw claudication, the patient does not endorse a clotting disorder, patient denies any trauma or eye pain and the headache is not associated with dizziness, weakness on one side of the body, diplopia, vertigo, slurred speech, or ataxia. Given the extremely low risk of these diagnoses further testing and evaluation for these possibilities does not appear to be indicated at this time.   Pt stopped taking her hydrochlorothiazide, will give refill  Her symptoms have resolved, neuro exam non-focal, gait steady, able to eat sandwich w/o difficulty  She had some pain to her left side face/ no vision changes, CRP and ESR neg, no sig pain with palpation to temporal artery, GCA seems unlikely at this time  CTA stable  EKG similar to prior, trop neg x2, no ongoing cp  Advised neuro f/u for headaches, give rx for phenergan and HCTZ  The patient improved significantly and was discharged in stable condition. Detailed discussions were had with the patient regarding current findings, and need for close f/u with PCP or on call doctor. The patient has been instructed to return immediately if the symptoms worsen in any way for re-evaluation. Patient verbalized understanding and is in agreement with  current care plan. All questions answered prior to discharge.       Additional history obtained: -Additional history obtained from EMS -External records from outside source obtained and reviewed including: Chart review including previous notes, labs, imaging, consultation notes including prior ed visits, home meds, prior labs/imaging   Lab Tests: -I ordered, reviewed, and interpreted labs.   The pertinent results include:   Labs Reviewed  CBC WITH DIFFERENTIAL/PLATELET - Abnormal; Notable for the following components:      Result Value   WBC 10.9 (*)    RBC 5.13 (*)    Hemoglobin 15.7 (*)    All other components within normal limits  COMPREHENSIVE METABOLIC PANEL - Abnormal; Notable for the following components:   Glucose, Bld 100 (*)    All other components within normal limits  I-STAT CHEM 8, ED - Abnormal; Notable for the following components:   Calcium, Ion 1.09 (*)    Hemoglobin 15.6 (*)    All other components within normal limits  CK  MAGNESIUM  SEDIMENTATION RATE  C-REACTIVE PROTEIN  TROPONIN I (HIGH SENSITIVITY)  TROPONIN I (HIGH SENSITIVITY)    Notable for stable labs  EKG   EKG Interpretation Date/Time:  Saturday November 17 2022 16:44:40 EDT Ventricular Rate:  59 PR Interval:  153 QRS Duration:  85 QT Interval:  417 QTC Calculation: 414 R Axis:   47  Text Interpretation: Sinus rhythm Atrial premature complex Anteroseptal infarct, old Borderline repolarization abnormality similar to prior 9/21 Reconfirmed by Tanda Rockers ([id number]) on 11/17/2022 4:55:50 PM         Imaging Studies ordered: I ordered imaging studies including CTA head/neck I independently visualized the following imaging with scope of interpretation limited to determining acute life threatening conditions related to emergency care; findings noted above, significant for stable imaging  I independently visualized and interpreted imaging. I agree with the radiologist  interpretation   Medicines ordered and prescription drug management: Meds ordered this encounter  Medications   sodium chloride 0.9 % bolus 1,000 mL   HYDROmorphone (DILAUDID) injection 0.5 mg   hydrALAZINE (APRESOLINE) injection 5 mg   ondansetron (ZOFRAN) injection  4 mg   iohexol (OMNIPAQUE) 350 MG/ML injection 60 mL   ketorolac (TORADOL) 15 MG/ML injection 15 mg   hydrALAZINE (APRESOLINE) injection 5 mg   dexamethasone (DECADRON) injection 10 mg   acetaminophen (TYLENOL) tablet 1,000 mg   promethazine (PHENERGAN) 25 MG tablet    Sig: Take 1 tablet (25 mg total) by mouth every 6 (six) hours as needed (headache).    Dispense:  14 tablet    Refill:  0   hydrochlorothiazide (HYDRODIURIL) 25 MG tablet    Sig: Take 1 tablet (25 mg total) by mouth daily.    Dispense:  30 tablet    Refill:  0    -I have reviewed the patients home medicines and have made adjustments as needed   Consultations Obtained: na   Cardiac Monitoring: The patient was maintained on a cardiac monitor.  I personally viewed and interpreted the cardiac monitored which showed an underlying rhythm of: nsr  Social Determinants of Health:  Diagnosis or treatment significantly limited by social determinants of health: current smoker, med noncompliance Counseled patient for approximately 3 minutes regarding smoking cessation. Discussed risks of smoking and how they applied and affected their visit here today.   CPT code: 40981: intermediate counseling for smoking cessation     Reevaluation: After the interventions noted above, I reevaluated the patient and found that they have resolved  Co morbidities that complicate the patient evaluation  Past Medical History:  Diagnosis Date   Anxiety    no meds   Dysmenorrhea    Fibroids    Heart murmur    "nothing to worry about" dx child, no problems ever   History of blood transfusion  2013   heavy periods, [facility] 2 units transfuesd   HTN (hypertension) 03/12/2012    one time no med   Hypertension    one time   Pneumonia     hx "had it once a year for a few years"  last time was 3- 4 years ago.   SVD (spontaneous vaginal delivery)    x 2      Dispostion: Disposition decision including need for hospitalization was considered, and patient discharged from emergency department.    Final Clinical Impression(s) / ED Diagnoses Final diagnoses:  Acute nonintractable headache, unspecified headache type  Poorly-controlled hypertension  Non compliance w medication regimen     This chart was dictated using voice recognition software.  Despite best efforts to proofread,  errors can occur which can change the documentation meaning.    Sloan Leiter, DO 11/18/22 [phone]

## 2022-11-17 NOTE — ED Notes (Signed)
Patient eating and drinking shows no sign of acute distress

## 2022-11-17 NOTE — ED Triage Notes (Addendum)
Patient reports left neck and L arm pain and went to UC for same. EKG done there and physician noted EKG changes and called EMS for transport to the ED. Patient also reports intermittent numbness to both arms x 3 hours. 4mg  morphine given at [facility].

## 2022-11-17 NOTE — ED Notes (Signed)
Assumed care of pt here via ems from UC with EKG changes and c/o intermittent arm numbness and left neck pain. Patient has received morphine and toradol and pain is now much better. Patient remains htn has hx of such otherwise vs wnl. Patient a/o x 4 respirations even and non labored

## 2022-11-17 NOTE — ED Notes (Signed)
Met patient in lobby.  Complaints of feeling pain in left top of shoulder and neck,  patient was tachypnea.  Patient repeatedly complained about numbness, but was very agitated by pain in top of right shoulder, grimacing, panting.  Patient repetitive with comments "I'm having a stroke.".  patient repetitively telling husband "something is wrong"  Dr Tracie Harrier at bedside to evaluate patient.

## 2022-11-17 NOTE — Discharge Instructions (Addendum)
It was a pleasure caring for you today in the emergency department.  Please return to the emergency department for any worsening or worrisome symptoms.  Please follow up with neurology in regards to recurrent headaches

## 2022-11-17 NOTE — ED Notes (Signed)
Patient is being discharged from the Urgent Care and sent to the Emergency Department via EMS . Per Dr Tracie Harrier, patient is in need of higher level of care due to altered loc/hypertension. Patient is aware and verbalizes understanding of plan of care.  Vitals:   11/17/22 1604  BP: (!) 200/114  Pulse: 78  Resp: (!) 30  SpO2: 98%

## 2022-11-17 NOTE — ED Notes (Signed)
911 called for emergency transport to ED for EKG CHANGES

## 2022-11-18 MED ORDER — HYDROCHLOROTHIAZIDE 25 MG PO TABS: 25.0000 mg | ORAL_TABLET | Freq: Every day | ORAL | 0 refills | Status: AC

## 2023-01-20 ENCOUNTER — Encounter (HOSPITAL_COMMUNITY): Payer: Self-pay | Admitting: *Deleted

## 2023-01-20 ENCOUNTER — Ambulatory Visit (HOSPITAL_COMMUNITY)
Admission: EM | Admit: 2023-01-20 | Discharge: 2023-01-20 | Disposition: A | Discharge status: Home / Self Care | Payer: Self-pay | Attending: Family Medicine | Admitting: Family Medicine

## 2023-01-20 DIAGNOSIS — K5900 Constipation, unspecified: Secondary | ICD-10-CM

## 2023-01-20 DIAGNOSIS — M545 Low back pain, unspecified: Secondary | ICD-10-CM

## 2023-01-20 MED ORDER — KETOROLAC TROMETHAMINE 10 MG PO TABS: 10.0000 mg | ORAL_TABLET | Freq: Four times a day (QID) | ORAL | 0 refills | Status: AC | PRN

## 2023-01-20 MED ORDER — KETOROLAC TROMETHAMINE 30 MG/ML IJ SOLN
INTRAMUSCULAR | Status: AC
  Filled 2023-01-20: qty 1

## 2023-01-20 MED ORDER — TIZANIDINE HCL 4 MG PO TABS: 4.0000 mg | ORAL_TABLET | Freq: Three times a day (TID) | ORAL | 0 refills | Status: AC | PRN

## 2023-01-20 MED ORDER — KETOROLAC TROMETHAMINE 30 MG/ML IJ SOLN
30.0000 mg | Freq: Once | INTRAMUSCULAR | Status: AC
  Administered 2023-01-20: 30 mg via INTRAMUSCULAR

## 2023-01-20 NOTE — ED Provider Notes (Signed)
[facility]    CSN: [id number] Arrival date & time: 01/20/23  1002      History   Chief Complaint Chief Complaint  Patient presents with   Back Pain    HPI Kimberly Jefferson is a 57 y.o. female.    Back Pain Here for right lower back pain.  It began on September 27.  She had wash the walls and done a little bit of bending and reaching up.  Then later that afternoon she suddenly began having pain in her right lower back.  Does not radiate down her leg.  Movement definitely makes it worse.  Bending at the waist makes it worse.  No fever or chills and no nausea or vomiting or diarrhea.  Appetite has been good.  Her last bowel movement was September 27 and was hard.  She states she does feel like if she could pass gas she would feel better.  She states she has had constipation trouble for a long time.  No dysuria or hematuria.  She has had a hysterectomy  She is allergic to doxycycline   No bowel incontinence and no new bladder incontinence.    Past Medical History:  Diagnosis Date   Anxiety    no meds   Dysmenorrhea    Fibroids    Heart murmur    "nothing to worry about" dx child, no problems ever   History of blood transfusion  2013   heavy periods, [facility] 2 units transfuesd   HTN (hypertension) 03/12/2012   one time no med   Hypertension    one time   Pneumonia     hx "had it once a year for a few years"  last time was 3- 4 years ago.   SVD (spontaneous vaginal delivery)    x 2    Patient Active Problem List   Diagnosis Date Noted   S/P hysterectomy 06/16/2019   ASCUS with positive high risk HPV cervical 06/10/2019   Symptomatic anemia 05/12/2019   Iron deficiency anemia due to chronic blood loss 05/12/2019   Abnormal uterine bleeding (AUB) 05/12/2019   Vitamin B12 deficiency anemia 03/22/2016   Heavy menstrual bleeding 03/22/2016   URI (upper respiratory infection) 03/22/2016   Folate deficiency 03/22/2016   Uterine fibroid 03/22/2016    Increased endometrial stripe 03/22/2016   Ankle fracture, right 06/18/2012   Hypokalemia 03/13/2012   Chest pain 03/12/2012   HTN (hypertension) 03/12/2012   Trichomonas 03/12/2012   Left sided numbness 03/11/2012   Syncope 03/11/2012   Anemia 03/11/2012    Past Surgical History:  Procedure Laterality Date   ABDOMINAL HYSTERECTOMY  06/16/2019   CESAREAN SECTION  1988   x 1   CHOLECYSTECTOMY  1990   HYSTERECTOMY ABDOMINAL WITH SALPINGECTOMY Bilateral 06/16/2019   Procedure: HYSTERECTOMY ABDOMINAL WITH SALPINGECTOMY;  Surgeon: Catalina Antigua, MD;  Location: [facility] OR;  Service: Gynecology;  Laterality: Bilateral;  tap block per MD   ORIF ANKLE FRACTURE Right 06/18/2012   Procedure: OPEN REDUCTION INTERNAL FIXATION (ORIF) ANKLE FRACTURE;  Surgeon: Verlee Rossetti, MD;  Location: [facility] OR;  Service: Orthopedics;  Laterality: Right;   TUBAL LIGATION  1990    OB History   No obstetric history on file.      Home Medications    Prior to Admission medications   Medication Sig Start Date End Date Taking? Authorizing Provider  hydrochlorothiazide (HYDRODIURIL) 25 MG tablet Take 1 tablet (25 mg total) by mouth daily. 11/18/22 01/20/23 Yes Sloan Leiter, DO  ketorolac (TORADOL) 10 MG tablet Take 1 tablet (10 mg total) by mouth every 6 (six) hours as needed (pain). 01/20/23  Yes Zenia Resides, MD  tiZANidine (ZANAFLEX) 4 MG tablet Take 1 tablet (4 mg total) by mouth every 8 (eight) hours as needed for muscle spasms. 01/20/23  Yes Zenia Resides, MD  albuterol (PROVENTIL HFA;VENTOLIN HFA) 108 (90 Base) MCG/ACT inhaler Inhale 2 puffs into the lungs every 4 (four) hours as needed for wheezing or shortness of breath. Patient not taking: Reported on 01/30/2018 12/17/17 10/17/18  Renne Crigler, PA-C    Family History Family History  Problem Relation Age of Onset   Healthy Mother    Healthy Father    Breast cancer Maternal Aunt     Social History Social History   Tobacco Use   Smoking  status: Some Days    Current packs/day: 0.50    Average packs/day: 0.5 packs/day for 20.0 years (10.0 ttl pk-yrs)    Types: Cigarettes   Smokeless tobacco: Never   Tobacco comments:    per patient currently down to 1 cigarette/week  noted on 06/12/2019  Vaping Use   Vaping status: Never Used  Substance Use Topics   Alcohol use: No   Drug use: No     Allergies   Bee venom and Doxycycline   Review of Systems Review of Systems  Musculoskeletal:  Positive for back pain.     Physical Exam Triage Vital Signs ED Triage Vitals  Encounter Vitals Group     BP 01/20/23 1021 (!) 149/83     Systolic BP Percentile --      Diastolic BP Percentile --      Pulse Rate 01/20/23 1021 (!) 52     Resp 01/20/23 1021 18     Temp 01/20/23 1021 97.7 F (36.5 C)     Temp Source 01/20/23 1021 Oral     SpO2 01/20/23 1021 96 %     Weight --      Height --      Head Circumference --      Peak Flow --      Pain Score 01/20/23 1019 9     Pain Loc --      Pain Education --      Exclude from Growth Chart --    No data found.  Updated Vital Signs BP (!) 149/83 (BP Location: Left Arm)   Pulse (!) 52   Temp 97.7 F (36.5 C) (Oral)   Resp 18   LMP 05/10/2019 (Exact Date)   SpO2 96%   Visual Acuity Right Eye Distance:   Left Eye Distance:   Bilateral Distance:    Right Eye Near:   Left Eye Near:    Bilateral Near:     Physical Exam Vitals reviewed.  Constitutional:      General: She is not in acute distress.    Appearance: She is not ill-appearing, toxic-appearing or diaphoretic.     Comments: In some obvious discomfort holding her right back.  HENT:     Mouth/Throat:     Mouth: Mucous membranes are moist.  Eyes:     Extraocular Movements: Extraocular movements intact.     Conjunctiva/sclera: Conjunctivae normal.     Pupils: Pupils are equal, round, and reactive to light.  Cardiovascular:     Rate and Rhythm: Normal rate and regular rhythm.     Heart sounds: No murmur  heard. Pulmonary:     Effort: Pulmonary effort is normal.  Breath sounds: Normal breath sounds.  Abdominal:     Tenderness: There is no abdominal tenderness.  Musculoskeletal:     Cervical back: Neck supple.     Comments: There is some tenderness of the right lumbosacral area.  No rash.  No deformity.  Straight leg raise is negative for radicular pain, though it does increase pain in her right lower back.  Lymphadenopathy:     Cervical: No cervical adenopathy.  Skin:    Coloration: Skin is not jaundiced or pale.  Neurological:     General: No focal deficit present.     Mental Status: She is alert and oriented to person, place, and time.  Psychiatric:        Behavior: Behavior normal.      UC Treatments / Results  Labs (all labs ordered are listed, but only abnormal results are displayed) Labs Reviewed - No data to display  EKG   Radiology No results found.  Procedures Procedures (including critical care time)  Medications Ordered in UC Medications  ketorolac (TORADOL) 30 MG/ML injection 30 mg (has no administration in time range)    Initial Impression / Assessment and Plan / UC Course  I have reviewed the triage vital signs and the nursing notes.  Pertinent labs & imaging results that were available during my care of the patient were reviewed by me and considered in my medical decision making (see chart for details).        Toradol injection is given here and Toradol tablets are sent to pharmacy.  Also tizanidine is sent in for muscle relaxer.  We discussed measures to help with the constipation be better, including MiraLAX and possible Dulcolax suppository.  She asked about doing an enema at home, and we discussed that it would be reasonable if she is not getting any relief.  If she worsens anyway she is to proceed to the emergency room.  She will follow-up with her primary care Final Clinical Impressions(s) / UC Diagnoses   Final diagnoses:  Acute  right-sided low back pain without sciatica  Constipation, unspecified constipation type     Discharge Instructions      You have been given a shot of Toradol 30 mg today.  Ketorolac 10 mg tablets--take 1 tablet every 6 hours as needed for pain.  This is the same medicine that is in the shot we just gave you   Take tizanidine 4 mg--1 every 8 hours as needed for muscle spasms; this medication can cause dizziness and sleepiness  For the constipation, you can try Dulcolax suppository, and also start taking MiraLAX daily.  If you are not getting some relief within the next 48 hours, you can consider doing an enema.  If you worsen anyway, with nausea and vomiting or fever or worsening pain, then please consider going to the emergency room  Please follow-up with your primary care about these issues     ED Prescriptions     Medication Sig Dispense Auth. Provider   ketorolac (TORADOL) 10 MG tablet Take 1 tablet (10 mg total) by mouth every 6 (six) hours as needed (pain). 20 tablet Ariona Deschene, Janace Aris, MD   tiZANidine (ZANAFLEX) 4 MG tablet Take 1 tablet (4 mg total) by mouth every 8 (eight) hours as needed for muscle spasms. 15 tablet Erin Uecker, Janace Aris, MD      PDMP not reviewed this encounter.   Zenia Resides, MD 01/20/23 [phone]

## 2023-01-20 NOTE — Discharge Instructions (Signed)
You have been given a shot of Toradol 30 mg today.  Ketorolac 10 mg tablets--take 1 tablet every 6 hours as needed for pain.  This is the same medicine that is in the shot we just gave you   Take tizanidine 4 mg--1 every 8 hours as needed for muscle spasms; this medication can cause dizziness and sleepiness  For the constipation, you can try Dulcolax suppository, and also start taking MiraLAX daily.  If you are not getting some relief within the next 48 hours, you can consider doing an enema.  If you worsen anyway, with nausea and vomiting or fever or worsening pain, then please consider going to the emergency room  Please follow-up with your primary care about these issues

## 2023-01-20 NOTE — ED Triage Notes (Signed)
Pt complains of low back pain x 3 days. She has taken aleve, BC, IBU as needed. She doesn't recall an injury but states she was washing her walls but reaching up not down.   Pt states she had a BM on Friday but states it was hard stool. She feels like if she could passes gas she would feel better.

## 2023-02-22 ENCOUNTER — Ambulatory Visit: Payer: Self-pay | Admitting: Diagnostic Neuroimaging
